# Patient Record
Sex: Male | Born: 1955 | Race: White | Hispanic: No | Marital: Married | Smoking: Former smoker
Health system: Southern US, Community
[De-identification: ages and names within clinical notes are randomized; demographics above are authoritative.]

## PROBLEM LIST (undated history)

## (undated) DIAGNOSIS — K429 Umbilical hernia without obstruction or gangrene: Secondary | ICD-10-CM

## (undated) DIAGNOSIS — J439 Emphysema, unspecified: Secondary | ICD-10-CM

## (undated) DIAGNOSIS — M1990 Unspecified osteoarthritis, unspecified site: Secondary | ICD-10-CM

## (undated) DIAGNOSIS — I2510 Atherosclerotic heart disease of native coronary artery without angina pectoris: Secondary | ICD-10-CM

## (undated) DIAGNOSIS — F1910 Other psychoactive substance abuse, uncomplicated: Secondary | ICD-10-CM

## (undated) DIAGNOSIS — I10 Essential (primary) hypertension: Secondary | ICD-10-CM

## (undated) DIAGNOSIS — E7800 Pure hypercholesterolemia, unspecified: Secondary | ICD-10-CM

## (undated) DIAGNOSIS — R0683 Snoring: Secondary | ICD-10-CM

## (undated) DIAGNOSIS — K439 Ventral hernia without obstruction or gangrene: Secondary | ICD-10-CM

## (undated) DIAGNOSIS — F419 Anxiety disorder, unspecified: Secondary | ICD-10-CM

## (undated) DIAGNOSIS — K219 Gastro-esophageal reflux disease without esophagitis: Secondary | ICD-10-CM

## (undated) DIAGNOSIS — G4730 Sleep apnea, unspecified: Secondary | ICD-10-CM

## (undated) DIAGNOSIS — K529 Noninfective gastroenteritis and colitis, unspecified: Secondary | ICD-10-CM

## (undated) DIAGNOSIS — I219 Acute myocardial infarction, unspecified: Secondary | ICD-10-CM

## (undated) DIAGNOSIS — T7840XA Allergy, unspecified, initial encounter: Secondary | ICD-10-CM

## (undated) HISTORY — DX: Gastro-esophageal reflux disease without esophagitis: K21.9

## (undated) HISTORY — DX: Essential (primary) hypertension: I10

## (undated) HISTORY — DX: Allergy, unspecified, initial encounter: T78.40XA

## (undated) HISTORY — DX: Atherosclerotic heart disease of native coronary artery without angina pectoris: I25.10

## (undated) HISTORY — PX: EXTERNAL EAR SURGERY: SHX627

## (undated) HISTORY — PX: COLONOSCOPY: SHX174

## (undated) HISTORY — PX: APPENDECTOMY: SHX54

## (undated) HISTORY — DX: Noninfective gastroenteritis and colitis, unspecified: K52.9

## (undated) HISTORY — PX: CARPAL TUNNEL RELEASE: SHX101

## (undated) HISTORY — DX: Anxiety disorder, unspecified: F41.9

## (undated) HISTORY — DX: Umbilical hernia without obstruction or gangrene: K42.9

## (undated) HISTORY — DX: Snoring: R06.83

## (undated) HISTORY — DX: Unspecified osteoarthritis, unspecified site: M19.90

## (undated) HISTORY — PX: OTHER SURGICAL HISTORY: SHX169

## (undated) HISTORY — PX: ELBOW SURGERY: SHX618

## (undated) HISTORY — PX: DG ARTHRO THUMB*R*: HXRAD209

## (undated) HISTORY — PX: NECK SURGERY: SHX720

## (undated) HISTORY — PX: UPPER GASTROINTESTINAL ENDOSCOPY: SHX188

## (undated) HISTORY — DX: Acute myocardial infarction, unspecified: I21.9

## (undated) HISTORY — DX: Ventral hernia without obstruction or gangrene: K43.9

## (undated) HISTORY — DX: Pure hypercholesterolemia, unspecified: E78.00

## (undated) HISTORY — DX: Other psychoactive substance abuse, uncomplicated: F19.10

## (undated) HISTORY — DX: Sleep apnea, unspecified: G47.30

---

## 1998-02-14 ENCOUNTER — Ambulatory Visit (HOSPITAL_BASED_OUTPATIENT_CLINIC_OR_DEPARTMENT_OTHER): Admission: RE | Admit: 1998-02-14 | Discharge: 1998-02-14 | Payer: Self-pay | Admitting: Orthopedic Surgery

## 1998-12-31 ENCOUNTER — Emergency Department (HOSPITAL_COMMUNITY): Admission: EM | Admit: 1998-12-31 | Discharge: 1998-12-31 | Payer: Self-pay | Admitting: *Deleted

## 2004-01-21 ENCOUNTER — Ambulatory Visit: Payer: Self-pay | Admitting: Internal Medicine

## 2004-02-08 ENCOUNTER — Ambulatory Visit: Payer: Self-pay | Admitting: Internal Medicine

## 2005-01-16 DIAGNOSIS — I2510 Atherosclerotic heart disease of native coronary artery without angina pectoris: Secondary | ICD-10-CM

## 2005-01-16 HISTORY — DX: Atherosclerotic heart disease of native coronary artery without angina pectoris: I25.10

## 2005-01-17 ENCOUNTER — Inpatient Hospital Stay (HOSPITAL_COMMUNITY): Admission: EM | Admit: 2005-01-17 | Discharge: 2005-01-21 | Payer: Self-pay | Admitting: Emergency Medicine

## 2005-02-19 ENCOUNTER — Encounter (HOSPITAL_COMMUNITY): Admission: RE | Admit: 2005-02-19 | Discharge: 2005-05-20 | Payer: Self-pay | Admitting: Cardiology

## 2006-06-11 ENCOUNTER — Ambulatory Visit: Payer: Self-pay | Admitting: Internal Medicine

## 2006-06-17 ENCOUNTER — Ambulatory Visit: Payer: Self-pay | Admitting: Internal Medicine

## 2006-07-15 ENCOUNTER — Ambulatory Visit: Payer: Self-pay | Admitting: Internal Medicine

## 2007-07-22 ENCOUNTER — Encounter: Payer: Self-pay | Admitting: Internal Medicine

## 2007-07-22 DIAGNOSIS — K253 Acute gastric ulcer without hemorrhage or perforation: Secondary | ICD-10-CM | POA: Insufficient documentation

## 2007-07-22 DIAGNOSIS — K2100 Gastro-esophageal reflux disease with esophagitis, without bleeding: Secondary | ICD-10-CM | POA: Insufficient documentation

## 2007-07-22 DIAGNOSIS — K263 Acute duodenal ulcer without hemorrhage or perforation: Secondary | ICD-10-CM | POA: Insufficient documentation

## 2007-07-22 DIAGNOSIS — K219 Gastro-esophageal reflux disease without esophagitis: Secondary | ICD-10-CM | POA: Insufficient documentation

## 2008-03-24 ENCOUNTER — Ambulatory Visit (HOSPITAL_BASED_OUTPATIENT_CLINIC_OR_DEPARTMENT_OTHER): Admission: RE | Admit: 2008-03-24 | Discharge: 2008-03-24 | Payer: Self-pay | Admitting: Otolaryngology

## 2008-04-01 ENCOUNTER — Ambulatory Visit: Payer: Self-pay | Admitting: Internal Medicine

## 2009-02-07 ENCOUNTER — Encounter (INDEPENDENT_AMBULATORY_CARE_PROVIDER_SITE_OTHER): Payer: Self-pay | Admitting: *Deleted

## 2009-03-04 ENCOUNTER — Encounter (INDEPENDENT_AMBULATORY_CARE_PROVIDER_SITE_OTHER): Payer: Self-pay | Admitting: *Deleted

## 2009-03-12 ENCOUNTER — Encounter (INDEPENDENT_AMBULATORY_CARE_PROVIDER_SITE_OTHER): Payer: Self-pay | Admitting: *Deleted

## 2009-03-13 ENCOUNTER — Ambulatory Visit: Payer: Self-pay | Admitting: Internal Medicine

## 2009-03-25 ENCOUNTER — Ambulatory Visit: Payer: Self-pay | Admitting: Internal Medicine

## 2010-03-18 NOTE — Miscellaneous (Signed)
Summary: [facility]  Clinical Lists Changes  Medications: Added new medication of MOVIPREP 100 GM  SOLR (PEG-KCL-NACL-NASULF-NA ASC-C) As per prep instructions. - Signed Rx of MOVIPREP 100 GM  SOLR (PEG-KCL-NACL-NASULF-NA ASC-C) As per prep instructions.;  #1 x 0;  Signed;  Entered by: Ezra Sites RN;  Authorized by: Hilarie Fredrickson MD;  Method used: Electronically to [facility]  [address] [phone]*, [address] [place], [place], [place]  [address], Ph: [phone] or [phone], Fax: [phone] Observations: Added new observation of NKA: T (03/13/2009 11:04)    Prescriptions: MOVIPREP 100 GM  SOLR (PEG-KCL-NACL-NASULF-NA ASC-C) As per prep instructions.  #1 x 0   Entered by:   Ezra Sites RN   Authorized by:   Hilarie Fredrickson MD   Signed by:   Ezra Sites RN on 03/13/2009   Method used:   Electronically to        [facility]  [address] [phone]* (retail)       [address]       [place], [place]  [address]       Ph: [phone] or [phone]       Fax: [phone]   RxID:   [id number]

## 2010-03-18 NOTE — Procedures (Signed)
Summary: Colonoscopy  Patient: [name] Note: All result statuses are Final unless otherwise noted.  Tests: (1) Colonoscopy (COL)   COL Colonoscopy           DONE     [facility]     [address]     [place], [place]  [address]           COLONOSCOPY PROCEDURE REPORT           PATIENT:  Jason, Munoz  MR#:  [id number]     BIRTHDATE:  16-Nov-1955, 53 yrs. old  GENDER:  male           ENDOSCOPIST:  Wilhemina Bonito. Eda Keys, MD     Referred by:  Surveillance Program Recall,           PROCEDURE DATE:  03/25/2009     PROCEDURE:  Surveillance Colonoscopy     ASA CLASS:  Class II     INDICATIONS:  history of hyperplastic polyps ;01-2004           MEDICATIONS:   Fentanyl 50 mcg IV, Versed 7 mg IV           DESCRIPTION OF PROCEDURE:   After the risks benefits and     alternatives of the procedure were thoroughly explained, informed     consent was obtained.  Digital rectal exam was performed and     revealed no abnormalities.   The LB CF-[id number] endoscope     was introduced through the anus and advanced to the cecum, which     was identified by both the appendix and ileocecal valve, without     limitations. TIME TO CECUM = 2:09 MIN. The quality of the prep was     excellent, using MoviPrep.  The instrument was then slowly     withdrawn (T=11:45 MIN) as the colon was fully examined.     <<PROCEDUREIMAGES>>           FINDINGS:  A normal appearing cecum, ileocecal valve, and     appendiceal orifice were identified. The ascending, hepatic     flexure, transverse, splenic flexure, descending, sigmoid colon,     and rectum appeared unremarkable.  No polyps or cancers were seen.     Retroflexed views in the rectum revealed no abnormalities.    The     scope was then withdrawn from the patient and the procedure     completed.           COMPLICATIONS:  None           ENDOSCOPIC IMPRESSION:     1) Normal colon     2) No polyps or cancers     RECOMMENDATIONS:     1)  Continue current colorectal screening recommendations for     "routine risk" patients with a repeat colonoscopy in 10 years.           ______________________________     Wilhemina Bonito. Eda Keys, MD           CC:  Geoffry Paradise, MD; The Patient           n.     eSIGNED:   Wilhemina Bonito. Eda Keys at 03/25/2009 12:08 PM           Nickolas Madrid, [id number]  Note: An exclamation mark (!) indicates a result that was not dispersed into the flowsheet. Document Creation Date: 03/25/2009 12:09 PM _______________________________________________________________________  (1) Order result status: Final Collection or  observation date-time: 03/25/2009 12:02 Requested date-time:  Receipt date-time:  Reported date-time:  Referring Physician:   Ordering Physician: Fransico Setters [phone]) Specimen Source:  Source: Launa Grill Order Number: [id number] Lab site:   Appended Document: Colonoscopy    Clinical Lists Changes  Observations: Added new observation of COLONNXTDUE: 03/2019 (03/25/2009 12:54)

## 2010-03-18 NOTE — Letter (Signed)
Summary: [facility] Instructions  [facility]  [address] [place], [place] [address]   Phone: [phone]  Fax: [phone]       Jason Munoz    [birth year]    MRN: [id number]        Procedure Day /Date:  ONGEXB 03/25/09     Arrival Time:  10:00am     Procedure Time:  11:00am     Location of Procedure:                    _X _  [facility] (4th Floor)                        PREPARATION FOR COLONOSCOPY WITH MOVIPREP   Starting 5 days prior to your procedure  Wednesday 02/02  do not eat nuts, seeds, popcorn, corn, beans, peas,  salads, or any raw vegetables.  Do not take any fiber supplements (e.g. Metamucil, Citrucel, and Benefiber).  THE DAY BEFORE YOUR PROCEDURE         DATE:  02/06   DAY:  Sunday  1.  Drink clear liquids the entire day-NO SOLID FOOD  2.  Do not drink anything colored red or purple.  Avoid juices with pulp.  No orange juice.  3.  Drink at least 64 oz. (8 glasses) of fluid/clear liquids during the day to prevent dehydration and help the prep work efficiently.  CLEAR LIQUIDS INCLUDE: Water Jello Ice Popsicles Tea (sugar ok, no milk/cream) Powdered fruit flavored drinks Coffee (sugar ok, no milk/cream) Gatorade Juice: apple, white grape, white cranberry  Lemonade Clear bullion, consomm, broth Carbonated beverages (any kind) Strained chicken noodle soup Hard Candy                             4.  In the morning, mix first dose of MoviPrep solution:    Empty 1 Pouch A and 1 Pouch B into the disposable container    Add lukewarm drinking water to the top line of the container. Mix to dissolve    Refrigerate (mixed solution should be used within 24 hrs)  5.  Begin drinking the prep at 5:00 p.m. The MoviPrep container is divided by 4 marks.   Every 15 minutes drink the solution down to the next mark (approximately 8 oz) until the full liter is complete.   6.  Follow completed prep with 16 oz of clear liquid of your  choice (Nothing red or purple).  Continue to drink clear liquids until bedtime.  7.  Before going to bed, mix second dose of MoviPrep solution:    Empty 1 Pouch A and 1 Pouch B into the disposable container    Add lukewarm drinking water to the top line of the container. Mix to dissolve    Refrigerate  THE DAY OF YOUR PROCEDURE      DATE:  03/25/09  DAY:  Monday  Beginning at  6:00 a.m. (5 hours before procedure):         1. Every 15 minutes, drink the solution down to the next mark (approx 8 oz) until the full liter is complete.  2. Follow completed prep with 16 oz. of clear liquid of your choice.    3. You may drink clear liquids until  9:00am  (2 HOURS BEFORE PROCEDURE).   MEDICATION INSTRUCTIONS  Unless otherwise instructed, you should take regular prescription medications with a small  sip of water   as early as possible the morning of your procedure.           OTHER INSTRUCTIONS  You will need a responsible adult at least 53 years of age to accompany you and drive you home.   This person must remain in the waiting room during your procedure.  Wear loose fitting clothing that is easily removed.  Leave jewelry and other valuables at home.  However, you may wish to bring a book to read or  an iPod/MP3 player to listen to music as you wait for your procedure to start.  Remove all body piercing jewelry and leave at home.  Total time from sign-in until discharge is approximately 2-3 hours.  You should go home directly after your procedure and rest.  You can resume normal activities the  day after your procedure.  The day of your procedure you should not:   Drive   Make legal decisions   Operate machinery   Drink alcohol   Return to work  You will receive specific instructions about eating, activities and medications before you leave.    The above instructions have been reviewed and explained to me by   Ezra Sites RN  March 13, 2009 11:28  AM     I fully understand and can verbalize these instructions _____________________________ Date _________

## 2010-03-18 NOTE — Letter (Signed)
Summary: Colonoscopy Letter  [facility]  [address] [place], [place] [address]   Phone: [phone]  Fax: [phone]      February 07, 2009 MRN: [id number]   Jason Munoz [address] [place], [place]  [address]   Dear [name],   According to your medical record, it is time for you to schedule a Colonoscopy. The American Cancer Society recommends this procedure as a method to detect early colon cancer. Patients with a family history of colon cancer, or a personal history of colon polyps or inflammatory bowel disease are at increased risk.  This letter has beeen generated based on the recommendations made at the time of your procedure. If you feel that in your particular situation this may no longer apply, please contact our office.  Please call our office at [phone] to schedule this appointment or to update your records at your earliest convenience.  Thank you for cooperating with [place] to provide you with the very best care possible.   Sincerely,  Wilhemina Bonito. Marina Goodell, M.D.  [facility] Gastroenterology Division [phone]

## 2010-03-18 NOTE — Procedures (Signed)
Summary: EGD   EGD  Procedure date:  06/17/2006  Findings:      Location: [facility]    Patient Name: Jason Munoz, Jason Munoz. MRN:  Procedure Procedures: Panendoscopy (EGD) CPT: 43235.    with biopsy(s)/brushing(s). CPT: D1846139.  Personnel: Endoscopist: Wilhemina Bonito. Marina Goodell, MD.  Exam Location: Exam performed in Outpatient Clinic. Outpatient  Patient Consent: Procedure, Alternatives, Risks and Benefits discussed, consent obtained, from patient. Consent was obtained by the RN.  Indications Symptoms: Dysphagia. Abdominal pain, location: epigastric. Reflux symptoms  History  Current Medications: Patient is not currently taking Coumadin.  Pre-Exam Physical: Performed Jun 17, 2006  Cardio-pulmonary exam, HEENT exam, Abdominal exam, Mental status exam WNL.  Comments: Pt. history reviewed/updated, physical exam performed prior to initiation of sedation?YES Exam Exam Info: Maximum depth of insertion Duodenum, intended Duodenum. Patient position: on left side. Vocal cords visualized. Gastric retroflexion performed. Images taken. ASA Classification: III. Tolerance: excellent.  Sedation Meds: Patient assessed and found to be appropriate for moderate (conscious) sedation. Fentanyl 50 mcg. given IV. Versed 5 mg. given IV. Cetacaine Spray 2 sprays given aerosolized.  Monitoring: BP and pulse monitoring done. Oximetry used. Supplemental O2 given  Findings ESOPHAGEAL INFLAMMATION: as a result of reflux. Severity is mild, erythema only.  Edema present. ICD9: Esophagitis, Reflux: 530.11. Comments: NO BARRETT'S OR OBVIOUS STRICTURE.  HIATAL HERNIA: ICD9: GERD: 530.81. ULCER: in Antrum Maximum size: 4 mm. Not bleeding, clear ulcer base. RUT done, results pending.  ICD9: Ulcer, Gastric, Acute without Hemorrhage: 531.30.  ULCER: in Duodenal Bulb Maximum size: 8 mm. Not bleeding, clear ulcer base. done, results pending.  ICD9: Ulcer, Duodenal, Acute without Hemorrhage: 532.30.  Comment: Friable.   Assessment  Diagnoses: 530.11: Esophagitis, Reflux.  530.81: GERD.  531.30: Ulcer, Gastric, Acute without Hemorrhage.  532.30: Ulcer, Duodenal, Acute without Hemorrhage.   Events  Unplanned Intervention: No unplanned interventions were required.  Unplanned Events: There were no complications. Plans Medication(s): PPI: Esomeprazole/Nexium 40 mg BID, starting Jun 17, 2006   Comments: 1.daily baby asprin is OK 2.NO ADVIL OR SIMILAR PRODUCTS. USE TYLENOL FOR ACHES AND PAINS Disposition: After procedure patient sent to recovery. After recovery patient sent home.  Scheduling: Office Visit, to [employer]. Marina Goodell, MD, IN 4 WEEKS   Comments: CANCELL ABDOMINAL ULTRASOUND PLANNED FOR TOMORROW  This report was created from the original endoscopy report, which was reviewed and signed by the above listed endoscopist.

## 2010-03-18 NOTE — Letter (Signed)
Summary: Previsit letter  [facility]  [address] [place], [place] [address]   Phone: [phone]  Fax: [phone]       03/04/2009 MRN: [id number]  Jason Munoz [address] [place], [place]  [address]  Dear Jason Munoz,  Welcome to the Gastroenterology Division at [facility].    You are scheduled to see a nurse for your pre-procedure visit on 03-13-09 at 11:00a.m. on the 3rd floor at [facility], [address]  We ask that you try to arrive at our office 15 minutes prior to your appointment time to allow for check-in.  Your nurse visit will consist of discussing your medical and surgical history, your immediate family medical history, and your medications.    Please bring a complete list of all your medications or, if you prefer, bring the medication bottles and we will list them.  We will need to be aware of both prescribed and over the counter drugs.  We will need to know exact dosage information as well.  If you are on blood thinners (Coumadin, Plavix, Aggrenox, Ticlid, etc.) please call our office today/prior to your appointment, as we need to consult with your physician about holding your medication.   Please be prepared to read and sign documents such as consent forms, a financial agreement, and acknowledgement forms.  If necessary, and with your consent, a friend or relative is welcome to sit-in on the nurse visit with you.  Please bring your insurance card so that we may make a copy of it.  If your insurance requires a referral to see a specialist, please bring your referral form from your primary care physician.  No co-pay is required for this nurse visit.     If you cannot keep your appointment, please call [phone] to cancel or reschedule prior to your appointment date.  This allows [place] the opportunity to schedule an appointment for another patient in need of care.    Thank you for choosing [facility] for your medical  needs.  We appreciate the opportunity to care for you.  Please visit [place] at our website  to learn more about our practice.                     Sincerely.                                                                                                                   The Gastroenterology Division

## 2010-05-20 ENCOUNTER — Encounter: Payer: Self-pay | Admitting: Cardiology

## 2010-05-20 ENCOUNTER — Ambulatory Visit (INDEPENDENT_AMBULATORY_CARE_PROVIDER_SITE_OTHER): Payer: 59 | Admitting: Cardiology

## 2010-05-20 VITALS — BP 120/80 | HR 53 | Ht 67.0 in | Wt 189.0 lb

## 2010-05-20 DIAGNOSIS — I2510 Atherosclerotic heart disease of native coronary artery without angina pectoris: Secondary | ICD-10-CM | POA: Insufficient documentation

## 2010-05-20 DIAGNOSIS — E7800 Pure hypercholesterolemia, unspecified: Secondary | ICD-10-CM

## 2010-05-20 MED ORDER — NITROGLYCERIN 0.4 MG SL SUBL: 0.4000 mg | SUBLINGUAL_TABLET | SUBLINGUAL | Status: DC | PRN

## 2010-05-20 MED ORDER — ATORVASTATIN CALCIUM 20 MG PO TABS: 20.0000 mg | ORAL_TABLET | Freq: Every day | ORAL | Status: DC

## 2010-05-20 NOTE — Assessment & Plan Note (Signed)
No recurrent angina. We will schedule him for a stress echo to followup on his coronary disease. I have encouraged him to increase his aerobic activity and to lose weight.

## 2010-05-20 NOTE — Assessment & Plan Note (Signed)
Goal LDL is 70. We will increase his Lipitor to 20 mg daily. I've recommended he have his lab work repeated by Dr. Jacky Kindle in 3 months. He should also have fasting glucose recheck and CBC.

## 2010-05-20 NOTE — Patient Instructions (Addendum)
We will schedule you for a stress Echo.  We will increase your Lipitor to 20 mg daily. Target LDL cholesterol is 70.  You should have repeat lab work with Dr. Jacky Kindle in 2 months including fasting glucose, lipids, and blood counts.   We will follow up for an office visit in 1 year.

## 2010-05-20 NOTE — Progress Notes (Signed)
HPI Jason Munoz is seen today for followup of coronary artery disease. He was last seen in 2007. He underwent stenting of the proximal LAD in December of 2006 using a 3.5 x 15 mm vision bare metal stent. He has done very well since then. His last stress test in December 2007 was normal. He denies any recurrent chest pain, shortness of breath, or palpitations. He claims that he has been so busy at work that he is been unable to exercise. He has had no other significant changes in his medical history. No Known Allergies  Current Outpatient Prescriptions on File Prior to Visit  Medication Sig Dispense Refill  . aspirin 81 MG tablet Take 81 mg by mouth daily.        Marland Kitchen esomeprazole (NEXIUM) 40 MG packet Take 40 mg by mouth daily before breakfast.  30 each  12  . metoprolol succinate (TOPROL-XL) 25 MG 24 hr tablet Take 25 mg by mouth daily.          Past Medical History  Diagnosis Date  . Coronary artery disease 12/06    stent LAD   . Hypercholesteremia   . Colitis     Past Surgical History  Procedure Date  . Appendectomy   . Bilateral ankle fractures   . External ear surgery     History reviewed. No pertinent family history.  History   Social History  . Marital Status: Married    Spouse Name: N/A    Number of Children: 3  . Years of Education: N/A   Occupational History  .  [employer]   Social History Main Topics  . Smoking status: Former Smoker    Quit date: 05/19/1996  . Smokeless tobacco: Not on file  . Alcohol Use: No  . Drug Use: No  . Sexually Active: Not on file   Other Topics Concern  . Not on file   Social History Narrative  . No narrative on file    ROS The patient denies any heat or cold intolerance.  No weight gain or weight loss.  The patient denies headaches or blurry vision.  There is no cough or sputum production.  The patient denies dizziness.  There is no hematuria or hematochezia.  The patient denies any muscle aches or arthritis.  The patient  denies any rash.  The patient denies frequent falling or instability.  There is no history of depression or anxiety.  All other systems were reviewed and are negative.  PHYSICAL EXAM BP 120/80  Pulse 53  Ht 5\' 7"  (1.702 m)  Wt 189 lb (85.73 kg)  BMI 29.60 kg/m2 The patient is alert and oriented x 3.  The mood and affect are normal.  The skin is warm and dry.  Color is normal.  The HEENT exam reveals that the sclera are nonicteric.  The mucous membranes are moist.  The carotids are 2+ without bruits.  There is no thyromegaly.  There is no JVD.  The lungs are clear.  The chest wall is non tender.  The heart exam reveals a regular rate with a normal S1 and S2.  There are no murmurs, gallops, or rubs.  The PMI is not displaced.   Abdominal exam reveals good bowel sounds.  There is no guarding or rebound.  There is no hepatosplenomegaly or tenderness.  There are no masses.  Exam of the legs reveal no clubbing, cyanosis, or edema.  The legs are without rashes.  The distal pulses are intact.  Cranial nerves  II - XII are intact.  Motor and sensory functions are intact.  The gait is normal.  Laboratory data: ECG demonstrates normal sinus rhythm with a rate of 53 beats per minute. It is a normal ECG. Blood work from September of 2011 showed a glucose of 136. All his other chemistries were normal. White count was elevated at 15,100, hemoglobin was 17.3. Total cholesterol 163, triglycerides 89, HDL 48, LDL 97. Urinalysis was normal. ASSESSMENT AND PLAN

## 2010-05-28 ENCOUNTER — Ambulatory Visit (HOSPITAL_COMMUNITY): Payer: 59 | Attending: Cardiology | Admitting: Radiology

## 2010-05-28 DIAGNOSIS — I2510 Atherosclerotic heart disease of native coronary artery without angina pectoris: Secondary | ICD-10-CM | POA: Insufficient documentation

## 2010-06-05 ENCOUNTER — Other Ambulatory Visit (HOSPITAL_COMMUNITY): Payer: 59 | Admitting: Radiology

## 2010-07-01 NOTE — Assessment & Plan Note (Signed)
[facility]                         GASTROENTEROLOGY OFFICE NOTE   [place], [place]                      MRN:          [id number]  DATE:07/15/2006                            DOB:          1955/12/10    HISTORY:  Jason Munoz presents today for followup.  He is a 55 year old  gentleman who was evaluated on June 11, 2006, for epigastric pain.  See  that dictation for details.  On Jun 17, 2006, he underwent upper  endoscopy.  He was found to have reflux esophagitis, a sliding hiatal  hernia, as well as non-bleeding ulcers in the gastric antrum and  duodenal bulb.  Testing for H. pylori was negative.  His risk factor for  ulcer disease was felt to be chronic Advil use as well as daily aspirin  use.  He was placed on a b.i.d. proton pump inhibitor and asked to avoid  nonsteroidal anti-inflammatory drugs.  We did recommend that he stay on  his baby aspirin daily for cardioprotective purposes, given his prior  cardiac history.  Previously ordered abdominal ultrasound was cancelled  in lieu of these findings.  He presents today for followup.  Since his  endoscopy, the patient reports doing well.  He is completely  asymptomatic and pleased.  We reviewed his endoscopy report and the  implications of the findings.   CURRENT MEDICATIONS:  1. Nexium 40 mg b.i.d.  2. Lipitor.  3. Toprol.  4. Multivitamin.  5. Aspirin 81 mg.   PHYSICAL EXAMINATION:  GENERAL:  A well-appearing male in no acute  distress.  VITAL SIGNS:  Blood pressure is 118/82, heart rate 74, weight is 186.4  pounds.  ABDOMEN:  Soft without tenderness, mass, or hernia.   IMPRESSION:  1. 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  2. Gastroesophageal reflux disease with endoscopic evidence of      esophagitis.  3. Coronary artery disease with prior coronary artery stent placement.   RECOMMENDATIONS:  1. Change Nexium to 40 mg p.o. daily indefinitely.  A daily proton      pump inhibitor will not only address his reflux disease but also      provide gastrointestinal mucosal protection since he requires co-      administration of aspirin for his heart disease.  Otherwise, he      should continue      to avoid unnecessary nonsteroidal anti-inflammatory drugs.  2. He will return to the care of Dr. Jacky Kindle.     Wilhemina Bonito. Marina Goodell, MD  Electronically Signed    JNP/MedQ  DD: 07/15/2006  DT: 07/15/2006  Job #: [id number]   cc:   Geoffry Paradise, M.D.  Peter M. [place], M.D.

## 2010-07-04 NOTE — H&P (Signed)
Jason Munoz, Jason Munoz NO.:  [id number]   MEDICAL RECORD NO.:  [id number]          PATIENT TYPE:  EMS   LOCATION:  MAJO                         FACILITY:  [facility]   PHYSICIAN:  Peter M. Swaziland, M.D.  DATE OF BIRTH:  02-03-1956   DATE OF ADMISSION:  01/17/2005  DATE OF DISCHARGE:                                HISTORY & PHYSICAL   HISTORY OF PRESENT ILLNESS:  Jason Munoz is a 55 year old white male who  presented to the emergency room today for evaluation of chest pain.  He  states that one week ago he was mowing his grass when he developed a dull  deep substernal chest pain associated with shortness of breath.  He stated  he just pushed through it and it did resolve when he stopped to rest.  A  couple of other times this week, he has had similar symptoms with walking.  Yesterday he was walking back to his car from the mall and developed more  significant chest pain and shortness of breath.  He states it was bad enough  that he had to stop.  He subsequently had to sit in his car for a few  minutes to allow the pain to subside.  This morning, he again described some  chest discomfort and shortness of breath and felt very uneasy and presented  to the emergency room for evaluation.  He is currently pain free.  With  these episodes, he states he did feel a little bit clammy.  He has also  noticed a decreased level of energy this week.   PAST MEDICAL HISTORY:  1.  Previous appendectomy.  2.  Bilateral ankle fractures.  3.  Previous ear surgery.  4.  History of hypercholesterolemia.  5.  History of colitis.   He is on no medications except aspirin daily.   He has no known allergies.   SOCIAL HISTORY:  He is married.  He has three sons.  He works for [employer].  He smokes cigars but not cigarettes.  He denies alcohol use.   FAMILY HISTORY:  He has three maternal uncles who have had coronary disease  and bypass surgery at a premature age.  His sister is okay.  His  father is  okay.  His mother has hypercholesterolemia.   REVIEW OF SYSTEMS:  Otherwise negative.   PHYSICAL EXAMINATION:  GENERAL:  The patient is a middle-aged male in no  distress.  VITAL SIGNS:  Blood pressure is 122/79, pulse 59 and regular, respirations  are normal.  He is afebrile.  Sats are 99% on room air.  HEENT:  Normocephalic, atraumatic.  Pupils equal, round, reactive to light  and accommodation.  Extraocular movements are full.  Oropharynx is clear.  NECK:  Supple without JVD, adenopathy, thyromegaly, or bruits.  LUNGS:  Clear.  CARDIAC:  Reveals a regular rate and rhythm without murmur, rub, or gallop,  or click.  There is no chest wall tenderness to palpation.  ABDOMEN:  Soft and nontender without masses or bruits.  EXTREMITIES:  Without edema.  Pulses are 2+ and symmetric.  He has no  cyanosis.  NEUROLOGIC:  Intact.   LABORATORY DATA:  Hemoglobin is 15.3.  Sodium 141, potassium 3.9, chloride  108, CO2 29, BUN 14, creatinine 1.2, glucose 65.  Point-of-care cardiac  enzymes are negative x2.  ECG is normal.   IMPRESSION:  1.  Unstable angina pectoris with typical cardiac symptoms, new onset.  2.  Hypercholesterolemia.   PLAN:  1.  Admit to telemetry.  2.  Rule out myocardial infarction.  3.  We will obtain serial cardiac enzymes.  4.  We will treat with aspirin and IV heparin and topical nitrates.  5.  Would hold beta-blocker at this point due to his resting bradycardia.  6.  We will obtain lipid panel and A1c.  7.  Anticipate cardiac catheterization on Monday.           ______________________________  Peter M. Swaziland, M.D.     PMJ/MEDQ  D:  01/17/2005  T:  01/17/2005  Job:  [id number]   cc:   Geoffry Paradise, M.D.  Fax: [phone]

## 2010-07-04 NOTE — Assessment & Plan Note (Signed)
[name]                         GASTROENTEROLOGY OFFICE NOTE   [name], [name]                      MRN:          [id number]  DATE:06/11/2006                            DOB:          [birth year]    REASON FOR CONSULTATION:  Epigastric pain.   HISTORY:  This is a pleasant 55 year old white male with a history of  hyperlipidemia and coronary artery disease for which he has undergone  prior stenting of the proximal left anterior descending coronary artery  in December of 2006.  He presents himself now for evaluation of  epigastric pain of one month's duration.  He was seen in this office in  October of 2005 for abdominal pain and rectal bleeding.  Complete  colonoscopy, including intubation of the terminal ileum was normal,  except for a dominative hyperplastic colon polyp which was removed.  He  has not been seen since that exam.  Patient reports a one-month history  of focal non-radiating epigastric pain.  He describes the pain as 5 or  6/10.  It can awake him at night.  There has been no associated nausea,  vomiting, fevers, melena or change in bowel habits.  He was evaluated at  Urgent Care and was placed on Prevacid which he states helped.  Off the  medications, symptoms seemed to return.  He is now using his father's  Nexium.  The patient has rare problems with indigestion and heartburn.  He does have intermittent solid food dysphagia.  He uses an aspirin  daily and Advil on a p.r.n. basis.  Patient feels that his symptoms may  be improved with meals.  Certainly seems to be improved with medication.  No obvious exacerbating factors.   PAST MEDICAL HISTORY:  As above.   SURGICAL HISTORY:  1. Appendectomy.  2. Ankle surgery.   ALLERGIES:  NO KNOWN DRUG ALLERGIES.   CURRENT MEDICATIONS:  Nexium 40 mg daily, Lipitor unspecified dose,  Toprol unspecified dose daily, aspirin one daily.   FAMILY HISTORY:  Negative for gastrointestinal  malignancy.   SOCIAL HISTORY:  Patient is married with three sons.  He works for  [employer], does not smoke or use alcohol, though has  used smokeless tobacco.   PHYSICAL EXAMINATION:  GENERAL:  Well-appearing male, no acute distress.  He is alert and oriented.  VITAL SIGNS:  Blood pressure was not recorded.  Heart rate is 68 and  regular.  His weight is 190.2 pounds.  HEENT:  Sclerae anicteric, conjunctivae pink, oral mucosa intact.  No  adenopathy.  LUNGS:  Clear.  HEART:  Regular.  ABDOMEN:  Soft without tenderness masses or hernia.  Good bowel sounds  heard.   IMPRESSION:  This is a 55 year old with coronary artery disease, who  presents with intermittent focal epigastric pain.  This is on the  background of gastroesophageal reflux disease and intermittent  dysphagia.  Rule out pain secondary to reflux or esophagitis.  Rule out  ulcer disease.  Rule out gallbladder disease.   RECOMMENDATIONS:  1. Abdominal ultrasound to rule out gallstones.  2. Upper endoscopy with possible esophageal dilation for  dysphagia.      [name] of the procedure, as well as the risks, benefits and      alternatives have been reviewed.  He understood and agreed to      proceed.  3. Daily proton pump inhibitor therapy.  Samples of Nexium, as well as      prescription with multiple refills has been provided.  4. Ongoing general medical care with Dr. Jacky Kindle.     Wilhemina Bonito. Marina Goodell, MD  Electronically Signed    JNP/MedQ  DD: 06/11/2006  DT: 06/11/2006  Job #: [id number]   cc:   Geoffry Paradise, M.D.  Peter M. Swaziland, M.D.

## 2010-07-04 NOTE — Discharge Summary (Signed)
NAMEBOE, DEANS NO.:  [id number]   MEDICAL RECORD NO.:  [id number]          PATIENT TYPE:  INP   LOCATION:  6529                         FACILITY:  [facility]   PHYSICIAN:  Peter M. Swaziland, M.D.  DATE OF BIRTH:  1955/10/14   DATE OF ADMISSION:  01/17/2005  DATE OF DISCHARGE:  01/21/2005                                 DISCHARGE SUMMARY   HISTORY OF PRESENT ILLNESS:  Jason Munoz is a 55 year old white male who  presented with a one-week history of typical exertional chest pain. This was  described as a deep substernal chest pain associated with shortness of  breath. The symptoms had progressed throughout the week and had developed at  rest on the morning of admission. He was admitted for further evaluation of  unstable angina. He does have a history of hypercholesterolemia.  He also  dips tobacco. For details of his past medical history, social history,  family history, physical exam please see admission history physical.   LABORATORY EVALUATION:  Resting ECG was normal.   Chest x-ray showed no active disease. There was some right upper lobe  scarring.   His pH was 7.36, pCO2 of 51, bicarb 29.  White count 7900, hemoglobin 14.7,  hematocrit 42.7, platelets 211,000.  Coags were normal.  Sodium 141,  potassium 3.9, chloride 106, glucose was 65, BUN 14, creatinine 1.2, other  chemistries were normal. Liver function studies were normal.  Magnesium is  2.4.  A1c was 5.1%.  Serial cardiac enzymes including CPK MB and troponin  were negative x4.  Lipid panel showed total cholesterol of 184,  triglycerides 130, HDL 41, LDL 117.   HOSPITAL COURSE:  The patient was admitted to telemetry monitoring. He was  placed on IV heparin.  He was given aspirin. His initial presentation he was  bradycardic so beta-blockers were not given. He was treated with topical  nitrates. He had no recurrent chest pain. He was started on Statin drug. He  was counseled on stopping use of  tobacco products. On January 20, 2005, the  patient underwent cardiac catheterization. This demonstrated single-vessel  obstructive coronary disease with a 90% proximal LAD stenosis. Left  ventricular function was normal with ejection fraction of 60%. The patient  underwent stenting of the proximal LAD using a 3.5 x 15 mm Vision stent.  This was successfully deployed with excellent angiographic result. The  patient did have some mild chest discomfort following the procedure. That  evening at approximately 5:30 p.m. he developed severe substernal chest pain  radiating to his arms associated with some shortness of breath. ECG showed  flipped T-waves in V1-V3 without acute ST-segment changes. The patient's  pain was relieved in approximately 10 minutes with IV morphine and IV  nitroglycerin. Subsequent cardiac enzymes were elevated with CK going from  65 with 1.6 MB to 248 with 28.8 MB and then to 211 with 25.4 MB, troponin  increased from 0.07-2.77 and then declined 2.43.  Follow-up ECG the next  morning was normal. The patient had no further chest pain during his  hospital stay. In reviewing his cath  films, it was felt that the most likely  etiology of his chest pain postprocedure was compromise of a small diagonal  branch which arose just distal to the stented segment. This branch was not  large enough for intervention and may have just represented acute spasm. The  patient was observed in the hospital an additional day because of the  increased cardiac enzymes but was stable throughout this time without  recurrent symptoms and it was felt that he was stable for discharge.   DISCHARGE DIAGNOSES:  1.  Unstable angina.  2.  Single vessel obstructive coronary disease status post stenting of the      proximal left anterior descending.  3.  Hypercholesterolemia.  4.  Tobacco use with oral tobacco.   DISCHARGE MEDICATIONS:  1.  Aspirin 325 mg per day.  2.  Plavix 75 mg per day.  3.   Lipitor 10 mg per day.  4.  Toprol XL 25 mg per day.  5.  Nitroglycerin sublingual p.r.n.   The patient is to avoid lifting for one week. He may return to work on  Monday January 26, 2005. Will instruct a low-fat diet and avoidance of  tobacco.  He will follow up Dr. Swaziland in two weeks.   DISCHARGE STATUS:  Improved.           ______________________________  Peter M. Swaziland, M.D.     PMJ/MEDQ  D:  01/21/2005  T:  01/21/2005  Job:  [id number]   cc:   Jason Munoz, M.D.  Fax: [phone]

## 2010-07-04 NOTE — Cardiovascular Report (Signed)
Jason Munoz, Jason Munoz NO.:  [id number]   MEDICAL RECORD NO.:  [id number]          PATIENT TYPE:  INP   LOCATION:  6529                         FACILITY:  [facility]   PHYSICIAN:  Peter M. Swaziland, M.D.  DATE OF BIRTH:  1955-05-28   DATE OF PROCEDURE:  01/21/2005  DATE OF DISCHARGE:  01/21/2005                              CARDIAC CATHETERIZATION   INDICATIONS FOR PROCEDURE:  This 55 year old white male presents with  unstable angina typical with exertional symptoms over the past week.  He has  a history of hypercholesterolemia.   PROCEDURES:  Left heart catheterization, coronary and left ventricular  angiography and stenting of the proximal left anterior descending.   CARDIOLOGIST:  Peter M. Swaziland, M.D.   ACCESS:  Via the right femoral artery using standard Seldinger technique.   EQUIPMENT USED:  6-French 4 cm right and left Judkins catheter, 6-French  pigtail catheter, 6-French left 3.5 Voda guide, a 0.14 high-torque floppy  wire, a 3.0 x 12 mm Maverick balloon and a 3.5 x 15 mm Vision stent.   CONTRAST:  185 cc of Omnipaque.   MEDICATIONS:  Versed total 2 mg IV, fentanyl 25 mcg IV, Integrilin double  bolus at 180 mcg/kg followed by continuous infusion of 2 mcg/kg per minute,  heparin 5900 units IV.   HEMODYNAMIC DATA:  Aortic pressure is 114/78 with a mean of 95 mmHg.  Left  ventricle pressure was 170 with EDP of 15 mmHg.   ANGIOGRAPHIC DATA:  The left coronary artery arises and distributes  normally.  There is mild irregularity at the ostium of the left main  coronary less than 20%.   The left anterior descending artery is a very large branch which extends  around the apex. There is severe high-grade stenosis in the proximal vessel  up to 90-95%. The diagonal branch is relatively small and without  significant disease.   The left circumflex coronary gives rise to a single large marginal vessel  and then terminates in the AV groove.  The marginal vessel  has somewhat  diffuse plaque up to 30%.   The right coronary arises and distributes normally.  It is a dominant  vessel.  It has scattered plaque throughout the proximal mid vessel up to  30%.   Left ventricular angiography was performed in the RAO view.  This  demonstrates normal left ventricular size and contractility with normal  systolic function.  Ejection fraction is estimated 60%.  There is no mitral  regurgitation or prolapse.   We proceeded at this point with intervention of the proximal LAD stenosis.  We initially used a JL-4 guide and a 0.014 high-torque floppy wire. We were  able to cross the lesion and perform initial balloon inflation using a 3.0 x  12 mm Maverick balloon dilating up to 6 and then 10 atmospheres.  Unfortunately the JL-4 guide gave poor support and backed out into the  aorta.  We were unable to reseat it in the ostium of the left main and the  wire was drawn across the lesion.  We then switched to the left Voda 3.5  mm  guide, which gave [place] excellent support.  We were able to recross the lesion  without any difficulty.  We then stented the lesion in the proximal LAD  using a 3.5 x 15 mm Multilink Vision stent. This was deployed at 9  atmospheres and then post dilated to 14 atmospheres. This yielded excellent  angiographic result with 0% residual stenosis, and there was TIMI grade 3  flow throughout the vessels and the diagonals as well.  The patient did have  slight residual chest pain even after with stenting.  The patient was  maintained on IV Integrilin.  He was  given 600 mg of Plavix p.o. and  continued on aspirin.   FINAL INTERPRETATION:  1.  Single-vessel obstructive atherosclerotic coronary disease.  2.  Normal left ventricular function.  3.  Successful intracoronary stenting of the proximal left anterior      descending.           ______________________________  Peter M. Swaziland, M.D.     PMJ/MEDQ  D:  01/21/2005  T:  01/21/2005  Job:   [id number]   cc:   Geoffry Paradise, M.D.  Fax: [phone]

## 2010-07-04 NOTE — Procedures (Signed)
[name], Jason Munoz               ACCOUNT NO.:  [id number]   MEDICAL RECORD NO.:  [id number]          PATIENT TYPE:  OUT   LOCATION:  SLEEP CENTER                 FACILITY[facility]  [facility]   PHYSICIAN:  Clinton D. Maple Hudson, MD, FCCP, FACPDATE OF BIRTH:  28-Jan-1956   DATE OF STUDY:                            NOCTURNAL POLYSOMNOGRAM   REFERRING PHYSICIAN:  Antony Contras, MD   INDICATION FOR STUDY:  Hypersomnia with sleep apnea.  The patient is  status post surgery for deviated septum.   EPWORTH SLEEPINESS SCORE:  Epworth sleepiness score 5/24.  BMI 28.2.  Weight 180 pounds.  Height 67 inches.  Neck 16 inches.   MEDICATIONS:  Home medications are charted and reviewed.   SLEEP ARCHITECTURE:  Total sleep time, 310 minutes with sleep efficiency  81.7%.  Stage I was 8.9%.  Stage II was 74.9%.  Stage III absent.  REM  16.3% of total sleep time.  Sleep latency was 3.5 minutes.  REM latency  73.5 minutes.  Wake after sleep onset, 66 minutes.  Arousal index 23.6.  No bedtime medication was taken.   RESPIRATORY DATA:  Apnea-hypopnea index (AHI) 7 per hour.  A total of 36  events was scored including 10 obstructive apneas, 1 central apnea, and  25 hypopneas.  Events were positional, also associated with supine sleep  position.  REM HPI 38 per hour.  This is a diagnostic NPSG study and  CPAP was not applied.   OXYGEN DATA:  Moderately loud snoring with oxygen desaturation to a  nadir of 85%.  Mean oxygen saturation through the study was 94.9% on  room air.  A total of 0.2 minutes was recorded with a saturation less  than 88%, insignificant.   CARDIAC DATA:  Normal sinus rhythm.   MOVEMENT/PARASOMNIA:  No significant movement disturbance.  Bathroom x1.   IMPRESSION/RECOMMENDATIONS:  1. Sleep architecture was unremarkable for sleep center environment.      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  2. Minimal obstructive sleep apnea/hypopnea  syndrome, AHI 7 per hour      with positional events noted only while sleeping supine.  3. Moderately loud snoring with oxygen desaturation to a nadir of 85%      and mean oxygen saturation through the study was normal at 94.9% on      room air.      Clinton D. Maple Hudson, MD, [facility], FACP  Diplomate, Biomedical engineer of Sleep Medicine  Electronically Signed     CDY/MEDQ  D:  03/31/2008 13:50:45  T:  03/31/2008 20:50:59  Job:  [id number]

## 2011-07-02 ENCOUNTER — Other Ambulatory Visit: Payer: Self-pay | Admitting: Cardiology

## 2011-07-02 DIAGNOSIS — I2510 Atherosclerotic heart disease of native coronary artery without angina pectoris: Secondary | ICD-10-CM

## 2011-07-02 MED ORDER — ATORVASTATIN CALCIUM 20 MG PO TABS: 20.0000 mg | ORAL_TABLET | Freq: Every day | ORAL | Status: DC

## 2011-11-24 ENCOUNTER — Encounter: Payer: Self-pay | Admitting: Cardiology

## 2011-12-11 ENCOUNTER — Other Ambulatory Visit: Payer: Self-pay

## 2011-12-11 DIAGNOSIS — I2510 Atherosclerotic heart disease of native coronary artery without angina pectoris: Secondary | ICD-10-CM

## 2011-12-11 MED ORDER — NITROGLYCERIN 0.4 MG SL SUBL: 0.4000 mg | SUBLINGUAL_TABLET | SUBLINGUAL | Status: DC | PRN

## 2012-06-08 ENCOUNTER — Other Ambulatory Visit: Payer: Self-pay | Admitting: Cardiology

## 2012-07-04 ENCOUNTER — Other Ambulatory Visit: Payer: Self-pay | Admitting: Cardiology

## 2012-10-04 ENCOUNTER — Ambulatory Visit (INDEPENDENT_AMBULATORY_CARE_PROVIDER_SITE_OTHER): Payer: 59 | Admitting: Nurse Practitioner

## 2012-10-04 ENCOUNTER — Encounter: Payer: Self-pay | Admitting: Nurse Practitioner

## 2012-10-04 VITALS — BP 170/100 | HR 84 | Ht 67.0 in | Wt 178.8 lb

## 2012-10-04 DIAGNOSIS — I10 Essential (primary) hypertension: Secondary | ICD-10-CM

## 2012-10-04 DIAGNOSIS — I259 Chronic ischemic heart disease, unspecified: Secondary | ICD-10-CM

## 2012-10-04 NOTE — Patient Instructions (Signed)
We are going to arrange for a stress test (stress Myoview)  Don't smoke!!  Have the company nurse keep a check on your BP. Goal is less than 140/90.  Call the [facility] office at [phone] if you have any questions, problems or concerns.

## 2012-10-04 NOTE — Progress Notes (Signed)
Jason Munoz Date of Birth: 03/17/1955 Medical Record #[id number]  History of Present Illness: Jason Munoz is seen back today for a follow up visit. Seen for Dr. Swaziland. Has known CAD with single vessel CAD with stenting of the proximal LAD in 2006 with a Vision BMS placed. Last stress test in 2007. Other issues include GERD and HLD. Sees Dr. Jacky Kindle for primary care and his labs.   Has not been seen since April of 2012. Stress echo done at that time.   Comes back today. Here alone. Doing ok. Has noted that he has had perhaps 5 to 6 spells of chest tightness associated with SOB. Last one was maybe a month ago. Started perhaps at the beginning of the year. Last for just a minute or so. Anxious since he has a heart condition. No NTG use. Back smoking cigars. Not exercising as much as he was. More stress with going thru divorce. BP has been good by the company nurse. Dr. Jacky Kindle has been regularly checking his labs.   Current Outpatient Prescriptions  Medication Sig Dispense Refill  . aspirin 81 MG tablet Take 81 mg by mouth daily.        Marland Kitchen atorvastatin (LIPITOR) 20 MG tablet TAKE 1 TABLET (20 MG TOTAL) BY MOUTH DAILY.  90 tablet  3  . esomeprazole (NEXIUM) 40 MG packet Take 40 mg by mouth daily before breakfast.  30 each  12  . metoprolol succinate (TOPROL-XL) 25 MG 24 hr tablet Take 25 mg by mouth daily.        Marland Kitchen NITROSTAT 0.4 MG SL tablet PLACE 1 TABLET (0.4 MG TOTAL) UNDER THE TONGUE EVERY 5 (FIVE) MINUTES AS NEEDED FOR CHEST PAIN.  25 tablet  0   No current facility-administered medications for this visit.    No Known Allergies  Past Medical History  Diagnosis Date  . Coronary artery disease 12/06    stent LAD in 2006  . Hypercholesteremia   . Colitis   . HTN (hypertension)   . GERD (gastroesophageal reflux disease)     Past Surgical History  Procedure Laterality Date  . Appendectomy    . Bilateral ankle fractures    . External ear surgery      History  Smoking status    . Former Smoker  . Quit date: 05/19/1996  Smokeless tobacco  . Not on file    History  Alcohol Use No    History reviewed. No pertinent family history.  Review of Systems: The review of systems is per the HPI.  All other systems were reviewed and are negative.  Physical Exam: BP 170/100  Pulse 84  Ht 5\' 7"  (1.702 m)  Wt 178 lb 12.8 oz (81.103 kg)  BMI 28 kg/m2 BP by me is 130/90. Patient is very pleasant and in no acute distress. BP quite high. Skin is warm and dry. Color is normal.  HEENT is unremarkable. Normocephalic/atraumatic. PERRL. Sclera are nonicteric. Neck is supple. No masses. No JVD. Lungs are clear. Cardiac exam shows a regular rate and rhythm. Abdomen is soft. Extremities are without edema. Gait and ROM are intact. No gross neurologic deficits noted.  LABORATORY DATA: EKG today shows sinus rhythm. No acute changes.    No results found for this basename: WBC,  HGB,  HCT,  PLT,  GLUCOSE,  CHOL,  TRIG,  HDL,  LDLDIRECT,  LDLCALC,  ALT,  AST,  NA,  K,  CL,  CREATININE,  BUN,  CO2,  TSH,  PSA,  INR,  GLUF,  HGBA1C,  MICROALBUR     Assessment / Plan: 1. Chest pain/known CAD - unfortunately, back smoking cigars, has gotten lax with exercise - will update his stress test - arrange for stress Myoview. Smoking cessation encouraged.   2. HLD - labs monitored by Dr. Jacky Kindle.   3. HTN - BP improved by me check. He will continue to monitor as an outpatient.    Patient is agreeable to this plan and will call if any problems develop in the interim.   Rosalio Macadamia, RN, ANP-C [facility] [address] [place], [place]  [address]

## 2012-10-18 ENCOUNTER — Ambulatory Visit (HOSPITAL_COMMUNITY): Payer: 59 | Attending: Nurse Practitioner | Admitting: Radiology

## 2012-10-18 VITALS — BP 132/88 | HR 78 | Ht 67.0 in | Wt 171.0 lb

## 2012-10-18 DIAGNOSIS — I2510 Atherosclerotic heart disease of native coronary artery without angina pectoris: Secondary | ICD-10-CM

## 2012-10-18 DIAGNOSIS — I259 Chronic ischemic heart disease, unspecified: Secondary | ICD-10-CM

## 2012-10-18 DIAGNOSIS — R0602 Shortness of breath: Secondary | ICD-10-CM | POA: Insufficient documentation

## 2012-10-18 DIAGNOSIS — R0789 Other chest pain: Secondary | ICD-10-CM | POA: Insufficient documentation

## 2012-10-18 DIAGNOSIS — I10 Essential (primary) hypertension: Secondary | ICD-10-CM

## 2012-10-18 DIAGNOSIS — F17200 Nicotine dependence, unspecified, uncomplicated: Secondary | ICD-10-CM | POA: Insufficient documentation

## 2012-10-18 DIAGNOSIS — R079 Chest pain, unspecified: Secondary | ICD-10-CM

## 2012-10-18 DIAGNOSIS — E785 Hyperlipidemia, unspecified: Secondary | ICD-10-CM | POA: Insufficient documentation

## 2012-10-18 DIAGNOSIS — R5381 Other malaise: Secondary | ICD-10-CM | POA: Insufficient documentation

## 2012-10-18 MED ORDER — TECHNETIUM TC 99M SESTAMIBI GENERIC - CARDIOLITE
10.0000 | Freq: Once | INTRAVENOUS | Status: AC | PRN
  Administered 2012-10-18: 10 via INTRAVENOUS

## 2012-10-18 MED ORDER — TECHNETIUM TC 99M SESTAMIBI GENERIC - CARDIOLITE
30.0000 | Freq: Once | INTRAVENOUS | Status: AC | PRN
  Administered 2012-10-18: 30 via INTRAVENOUS

## 2012-10-18 NOTE — Progress Notes (Signed)
[facility] SITE 3 NUCLEAR MED [address] [place], [place] [address] [phone]    Cardiology Nuclear Med Study  Jason Munoz is a 57 y.o. male     MRN : [id number]     DOB: [birth year]  Procedure Date: 10/18/2012  Nuclear Med Background Indication for Stress Test:  Evaluation for Ischemia and Stent Patency History:  '06 Stent-LAD; '07 OZH:[id number], EF=74% Cardiac Risk Factors: Hypertension, Lipids and Smoker-Occasional Cigar  Symptoms:  Chest Tightness (last episode of chest discomfort was about a month ago), Fatigue and SOB   Nuclear Pre-Procedure Caffeine/Decaff Intake:  None NPO After: 8:00pm   Lungs:  Clear. O2 Sat: 96% on room air. IV 0.9% NS with Angio Cath:  20g  IV Site: R Hand  IV Started by:  Cathlyn Parsons, RN  Chest Size (in):  42 Cup Size: n/a  Height: 5\' 7"  (1.702 m)  Weight:  171 lb (77.565 kg)  BMI:  Body mass index is 26.78 kg/(m^2). Tech Comments:  No Toprol x 48 hrs   Nuclear Med Study 1 or 2 day study: 1 day  Stress Test Type:  Stress  Reading MD: Kristeen Miss, MD  Order Authorizing Provider:  Peter Swaziland, MD  Resting Radionuclide: Technetium 76m Sestamibi  Resting Radionuclide Dose: 11.0 mCi   Stress Radionuclide:  Technetium 37m Sestamibi  Stress Radionuclide Dose: 33.0 mCi           Stress Protocol Rest HR: 78 Stress HR: 146  Rest BP: 132/88 Stress BP: 191/90  Exercise Time (min): 10:00 METS: 11.7   Predicted Max HR: 165 bpm % Max HR: 88.48 bpm Rate Pressure Product: 46962   Dose of Adenosine (mg):  n/a Dose of Lexiscan: n/a mg  Dose of Atropine (mg): n/a Dose of Dobutamine: n/a mcg/kg/min (at max HR)  Stress Test Technologist: Smiley Houseman, CMA-N  Nuclear Technologist:  Domenic Polite, CNMT     Rest Procedure:  Myocardial perfusion imaging was performed at rest 45 minutes following the intravenous administration of Technetium 76m Sestamibi.  Rest ECG: NSR - Normal EKG  Stress Procedure:  The patient exercised on  the treadmill utilizing the Bruce Protocol for ten minutes. The patient stopped due to fatigue and denied any chest pain.  Technetium 29m Sestamibi was injected at peak exercise and myocardial perfusion imaging was performed after a brief delay.  Stress ECG: No significant change from baseline ECG  QPS Raw Data Images:  Mild diaphragmatic attenuation.  Normal left ventricular size. Stress Images:  There is a medium sized area of moderate attenuation of the mid and basal inferior and mid/basal lateral  wall.  The remaining myocardium has normal uptake.       Rest Images:  There is a medium sized area of moderate attenuation of the mid and basal inferior and mid/basal lateral  wall.  The remaining myocardium has normal uptake.    Subtraction (SDS):  No evidence of ischemia.  There is a fixed defect that may be due to a previous inferior lateral MI vs diaphragmatic attenuation. Transient Ischemic Dilatation (Normal <1.22):  n/a Lung/Heart Ratio (Normal <0.45):  0.39  Quantitative Gated Spect Images QGS EDV:  90 ml QGS ESV:  32 ml  Impression Exercise Capacity:  Excellent exercise capacity. BP Response:  Normal blood pressure response. Clinical Symptoms:  No significant symptoms noted. ECG Impression:  No significant ST segment change suggestive of ischemia. Comparison with Prior Nuclear Study: The fixed inferior lateral defect is new from the previous Myoview  study 01/21/06.  Overall Impression:  Low risk stress nuclear study .  There is a fixed defect in the inferior lateral walls that may be due to a previous inferior lateral MI vs. diaphragmatic attenuation.    The inferior and lateral walls contract normally. .  LV Ejection Fraction: 65%.  LV Wall Motion:  NL LV Function; NL Wall Motion.   Vesta Mixer, Montez Hageman., MD, [facility] 10/18/2012, 4:50 PM Office - [phone] Pager [phone]

## 2012-11-03 ENCOUNTER — Other Ambulatory Visit: Payer: Self-pay

## 2012-11-03 MED ORDER — NITROGLYCERIN 0.4 MG SL SUBL: SUBLINGUAL_TABLET | SUBLINGUAL | Status: DC

## 2013-07-24 ENCOUNTER — Other Ambulatory Visit: Payer: Self-pay | Admitting: Cardiology

## 2013-08-13 ENCOUNTER — Encounter (HOSPITAL_COMMUNITY): Payer: Self-pay | Admitting: Emergency Medicine

## 2013-08-13 ENCOUNTER — Emergency Department (HOSPITAL_COMMUNITY)
Admission: EM | Admit: 2013-08-13 | Discharge: 2013-08-13 | Disposition: A | Discharge status: Home / Self Care | Payer: 59 | Attending: Emergency Medicine | Admitting: Emergency Medicine

## 2013-08-13 DIAGNOSIS — Z8781 Personal history of (healed) traumatic fracture: Secondary | ICD-10-CM | POA: Insufficient documentation

## 2013-08-13 DIAGNOSIS — E785 Hyperlipidemia, unspecified: Secondary | ICD-10-CM | POA: Insufficient documentation

## 2013-08-13 DIAGNOSIS — Z87891 Personal history of nicotine dependence: Secondary | ICD-10-CM | POA: Insufficient documentation

## 2013-08-13 DIAGNOSIS — Z7982 Long term (current) use of aspirin: Secondary | ICD-10-CM | POA: Insufficient documentation

## 2013-08-13 DIAGNOSIS — K219 Gastro-esophageal reflux disease without esophagitis: Secondary | ICD-10-CM | POA: Insufficient documentation

## 2013-08-13 DIAGNOSIS — R040 Epistaxis: Secondary | ICD-10-CM | POA: Insufficient documentation

## 2013-08-13 DIAGNOSIS — I10 Essential (primary) hypertension: Secondary | ICD-10-CM | POA: Insufficient documentation

## 2013-08-13 DIAGNOSIS — I2510 Atherosclerotic heart disease of native coronary artery without angina pectoris: Secondary | ICD-10-CM | POA: Insufficient documentation

## 2013-08-13 DIAGNOSIS — Z79899 Other long term (current) drug therapy: Secondary | ICD-10-CM | POA: Insufficient documentation

## 2013-08-13 MED ORDER — OXYMETAZOLINE HCL 0.05 % NA SOLN
1.0000 | Freq: Once | NASAL | Status: AC
  Administered 2013-08-13: 1 via NASAL
  Filled 2013-08-13: qty 15

## 2013-08-13 NOTE — ED Notes (Signed)
Reports having nosebleed from bilateral nare since 10am. Went to an ucc an told to come here for further eval. Only blood thinner is ASA and bp is 171/104 at triage.

## 2013-08-13 NOTE — ED Notes (Signed)
MD at bedside. 

## 2013-08-13 NOTE — ED Notes (Signed)
NAD noted. Bleeding well under control. Pt given discharge instructions. All questions identified and answered. Pt discharged home with spouse. Pt ambulatory on discharge.

## 2013-08-13 NOTE — ED Provider Notes (Signed)
CSN: [id number]     Arrival date & time 08/13/13  1142 History   First MD Initiated Contact with Patient 08/13/13 1206     Chief Complaint  Patient presents with  . Epistaxis     (Consider location/radiation/quality/duration/timing/severity/associated sxs/prior Treatment) HPI  [age]y male with epistaxis. Onset around 10 AM this morning. Denies any trauma. It seems to be coming from bilateral nares. On 81 mg of aspirin daily, otherwise no blood thinners. No history of recurrent nosebleed. No dizziness, lightheadedness or shortness of breath. Has not noticed any bleeding from his gums, blood in his stool or easy bruising.  Past Medical History  Diagnosis Date  . Coronary artery disease 12/06    stent LAD in 2006  . Hypercholesteremia   . Colitis   . HTN (hypertension)   . GERD (gastroesophageal reflux disease)    Past Surgical History  Procedure Laterality Date  . Appendectomy    . Bilateral ankle fractures    . External ear surgery     History reviewed. No pertinent family history. History  Substance Use Topics  . Smoking status: Former Smoker    Quit date: 05/19/1996  . Smokeless tobacco: Not on file  . Alcohol Use: No    Review of Systems  All systems reviewed and negative, other than as noted in HPI.   Allergies  Review of patient's allergies indicates no known allergies.  Home Medications   Prior to Admission medications   Medication Sig Start Date End Date Taking? Authorizing Provider  aspirin EC 81 MG tablet Take 81 mg by mouth daily.   Yes Historical Provider, MD  atorvastatin (LIPITOR) 20 MG tablet Take 20 mg by mouth daily.   Yes Historical Provider, MD  esomeprazole (NEXIUM) 40 MG packet Take 40 mg by mouth daily before breakfast. 05/20/10  Yes Peter M SwazilandJordan, MD  metoprolol succinate (TOPROL-XL) 25 MG 24 hr tablet Take 25 mg by mouth daily.     Yes Historical Provider, MD  Multiple Vitamins-Minerals (MULTIVITAMIN PO) Take 1 tablet by mouth daily.   Yes  Historical Provider, MD  nitroGLYCERIN (NITROSTAT) 0.4 MG SL tablet Place 0.4 mg under the tongue every 5 (five) minutes as needed for chest pain.   Yes Historical Provider, MD   BP 155/104  Pulse 96  Temp(Src) 97.7 F (36.5 C) (Oral)  Resp 12  Ht 5\' 7"  (1.702 m)  Wt 171 lb 5 oz (77.707 kg)  BMI 26.83 kg/m2  SpO2 97% Physical Exam  Nursing note and vitals reviewed. Constitutional: He appears well-developed and well-nourished. No distress.  HENT:  Head: Normocephalic.  Dried blood b/l nares. Some clot R nare. Suctioned. Mild oozing from R nare after. Blood posterior pharynx. No discrete source identified.   Eyes: Conjunctivae are normal. Right eye exhibits no discharge. Left eye exhibits no discharge.  Neck: Neck supple.  Cardiovascular: Normal rate, regular rhythm and normal heart sounds.  Exam reveals no gallop and no friction rub.   No murmur heard. Pulmonary/Chest: Effort normal and breath sounds normal. No respiratory distress.  Abdominal: Soft. He exhibits no distension. There is no tenderness.  Musculoskeletal: He exhibits no edema and no tenderness.  Neurological: He is alert.  Skin: Skin is warm and dry.  Psychiatric: He has a normal mood and affect. His behavior is normal. Thought content normal.    ED Course  EPISTAXIS MANAGEMENT Date/Time: 08/13/2013 12:15 PM Performed by: Raeford RazorKOHUT,  Authorized by: Raeford RazorKOHUT,  Consent: Verbal consent obtained. Risks and benefits: risks, benefits and  alternatives were discussed Consent given by: patient Patient identity confirmed: verbally with patient and provided demographic data Patient sedated: no Treatment site: right posterior Repair method: suction Treatment complexity: simple Patient tolerance: Patient tolerated the procedure well with no immediate complications. Comments: Clot exacuated. Posterior bleed. Exact area not identified, but more bleeding noted from R nare. Afrin sprayed b/l. Pt instructed to hold  pressure.     (including critical care time) Labs Review Labs Reviewed - No data to display  Imaging Review No results found.   EKG Interpretation None      MDM   Final diagnoses:  None    58 year old male with epistaxis. Bleeding controlled with administration of Afrin. Observed for 30 minutes after without evidence rebleeding. No dizziness, lightheadedness or other symptoms to suggest significant blood loss. No blood thinners aside from a baby aspirin. Feels stable for discharge at this time. Hypertension noted. History the same. Instructed to keep a log of his blood pressures for his next followup appointment with his PCP.    Raeford RazorStephen , MD 08/13/13 1258

## 2013-08-13 NOTE — Discharge Instructions (Signed)
Nosebleed  Nosebleeds can be caused by many conditions including trauma, infections, polyps, foreign bodies, dry mucous membranes or climate, medications and air conditioning. Most nosebleeds occur in the front of the nose. It is because of this location that most nosebleeds can be controlled by pinching the nostrils gently and continuously. Do this for at least 10 to 20 minutes. The reason for this long continuous pressure is that you must hold it long enough for the blood to clot. If during that 10 to 20 minute time period, pressure is released, the process may have to be started again. The nosebleed may stop by itself, quit with pressure, need concentrated heating (cautery) or stop with pressure from packing.  HOME CARE INSTRUCTIONS    If your nose was packed, try to maintain the pack inside until your caregiver removes it. If a gauze pack was used and it starts to fall out, gently replace or cut the end off. Do not cut if a balloon catheter was used to pack the nose. Otherwise, do not remove unless instructed.   Avoid blowing your nose for 12 hours after treatment. This could dislodge the pack or clot and start bleeding again.   If the bleeding starts again, sit up and bending forward, gently pinch the front half of your nose continuously for 20 minutes.   If bleeding was caused by dry mucous membranes, cover the inside of your nose every morning with a petroleum or antibiotic ointment. Use your little fingertip as an applicator. Do this as needed during dry weather. This will keep the mucous membranes moist and allow them to heal.   Maintain humidity in your home by using less air conditioning or using a humidifier.   Do not use aspirin or medications which make bleeding more likely. Your caregiver can give you recommendations on this.   Resume normal activities as able but try to avoid straining, lifting or bending at the waist for several days.   If the nosebleeds become recurrent and the cause is  unknown, your caregiver may suggest laboratory tests.  SEEK IMMEDIATE MEDICAL CARE IF:    Bleeding recurs and cannot be controlled.   There is unusual bleeding from or bruising on other parts of the body.   You have a fever.   Nosebleeds continue.   There is any worsening of the condition which originally brought you in.   You become lightheaded, feel faint, become sweaty or vomit blood.  MAKE SURE YOU:    Understand these instructions.   Will watch your condition.   Will get help right away if you are not doing well or get worse.  Document Released: 11/12/2004 Document Revised: 04/27/2011 Document Reviewed: 01/04/2009  ExitCare Patient Information 2015 [employer]. This information is not intended to replace advice given to you by your health care provider. Make sure you discuss any questions you have with your health care provider.

## 2013-08-23 ENCOUNTER — Encounter (HOSPITAL_COMMUNITY): Payer: Self-pay | Admitting: Emergency Medicine

## 2013-08-23 ENCOUNTER — Emergency Department (HOSPITAL_COMMUNITY)
Admission: EM | Admit: 2013-08-23 | Discharge: 2013-08-23 | Disposition: A | Discharge status: Home / Self Care | Payer: 59 | Attending: Emergency Medicine | Admitting: Emergency Medicine

## 2013-08-23 DIAGNOSIS — E7800 Pure hypercholesterolemia, unspecified: Secondary | ICD-10-CM | POA: Insufficient documentation

## 2013-08-23 DIAGNOSIS — Z87891 Personal history of nicotine dependence: Secondary | ICD-10-CM | POA: Insufficient documentation

## 2013-08-23 DIAGNOSIS — Z7982 Long term (current) use of aspirin: Secondary | ICD-10-CM | POA: Insufficient documentation

## 2013-08-23 DIAGNOSIS — K219 Gastro-esophageal reflux disease without esophagitis: Secondary | ICD-10-CM | POA: Insufficient documentation

## 2013-08-23 DIAGNOSIS — R040 Epistaxis: Secondary | ICD-10-CM

## 2013-08-23 DIAGNOSIS — Z79899 Other long term (current) drug therapy: Secondary | ICD-10-CM | POA: Insufficient documentation

## 2013-08-23 DIAGNOSIS — I2510 Atherosclerotic heart disease of native coronary artery without angina pectoris: Secondary | ICD-10-CM | POA: Insufficient documentation

## 2013-08-23 DIAGNOSIS — I10 Essential (primary) hypertension: Secondary | ICD-10-CM | POA: Insufficient documentation

## 2013-08-23 LAB — COMPREHENSIVE METABOLIC PANEL
ALT: 51 U/L (ref 0–53)
AST: 37 U/L (ref 0–37)
Albumin: 3.5 g/dL (ref 3.5–5.2)
Alkaline Phosphatase: 91 U/L (ref 39–117)
Anion gap: 17 — ABNORMAL HIGH (ref 5–15)
BUN: 10 mg/dL (ref 6–23)
CO2: 22 mEq/L (ref 19–32)
Calcium: 9.2 mg/dL (ref 8.4–10.5)
Chloride: 103 mEq/L (ref 96–112)
Creatinine, Ser: 0.84 mg/dL (ref 0.50–1.35)
GFR calc Af Amer: >90 mL/min (ref >90)
GFR calc non Af Amer: >90 mL/min (ref >90)
Glucose, Bld: 98 mg/dL (ref 70–99)
Potassium: 4.2 mEq/L (ref 3.7–5.3)
Sodium: 142 mEq/L (ref 137–147)
Total Bilirubin: 0.4 mg/dL (ref 0.3–1.2)
Total Protein: 6.7 g/dL (ref 6.0–8.3)

## 2013-08-23 LAB — CBC WITH DIFFERENTIAL/PLATELET
Basophils Absolute: 0 10*3/uL (ref 0.0–0.1)
Basophils Relative: 0 % (ref 0–1)
Eosinophils Absolute: 0.1 10*3/uL (ref 0.0–0.7)
Eosinophils Relative: 2 % (ref 0–5)
HCT: 43.3 % (ref 39.0–52.0)
Hemoglobin: 14.6 g/dL (ref 13.0–17.0)
Lymphocytes Relative: 21 % (ref 12–46)
Lymphs Abs: 1.4 10*3/uL (ref 0.7–4.0)
MCH: 31.9 pg (ref 26.0–34.0)
MCHC: 33.7 g/dL (ref 30.0–36.0)
MCV: 94.5 fL (ref 78.0–100.0)
Monocytes Absolute: 1.1 10*3/uL — ABNORMAL HIGH (ref 0.1–1.0)
Monocytes Relative: 16 % — ABNORMAL HIGH (ref 3–12)
Neutro Abs: 4.2 10*3/uL (ref 1.7–7.7)
Neutrophils Relative %: 61 % (ref 43–77)
Platelets: 257 10*3/uL (ref 150–400)
RBC: 4.58 MIL/uL (ref 4.22–5.81)
RDW: 13.9 % (ref 11.5–15.5)
WBC: 6.8 10*3/uL (ref 4.0–10.5)

## 2013-08-23 LAB — PROTIME-INR
INR: 0.93 (ref 0.00–1.49)
Prothrombin Time: 12.5 seconds (ref 11.6–15.2)

## 2013-08-23 MED ORDER — OXYCODONE-ACETAMINOPHEN 5-325 MG PO TABS: 2.0000 | ORAL_TABLET | ORAL | Status: DC | PRN

## 2013-08-23 MED ORDER — AMOXICILLIN-POT CLAVULANATE 875-125 MG PO TABS: 1.0000 | ORAL_TABLET | Freq: Two times a day (BID) | ORAL | Status: DC

## 2013-08-23 MED ORDER — AMOXICILLIN-POT CLAVULANATE 875-125 MG PO TABS
1.0000 | ORAL_TABLET | Freq: Once | ORAL | Status: AC
  Administered 2013-08-23: 1 via ORAL
  Filled 2013-08-23: qty 1

## 2013-08-23 NOTE — Discharge Instructions (Signed)
Nosebleed  Nosebleeds can be caused by many conditions including trauma, infections, polyps, foreign bodies, dry mucous membranes or climate, medications and air conditioning. Most nosebleeds occur in the front of the nose. It is because of this location that most nosebleeds can be controlled by pinching the nostrils gently and continuously. Do this for at least 10 to 20 minutes. The reason for this long continuous pressure is that you must hold it long enough for the blood to clot. If during that 10 to 20 minute time period, pressure is released, the process may have to be started again. The nosebleed may stop by itself, quit with pressure, need concentrated heating (cautery) or stop with pressure from packing.  HOME CARE INSTRUCTIONS    If your nose was packed, try to maintain the pack inside until your caregiver removes it. If a gauze pack was used and it starts to fall out, gently replace or cut the end off. Do not cut if a balloon catheter was used to pack the nose. Otherwise, do not remove unless instructed.   Avoid blowing your nose for 12 hours after treatment. This could dislodge the pack or clot and start bleeding again.   If the bleeding starts again, sit up and bending forward, gently pinch the front half of your nose continuously for 20 minutes.   If bleeding was caused by dry mucous membranes, cover the inside of your nose every morning with a petroleum or antibiotic ointment. Use your little fingertip as an applicator. Do this as needed during dry weather. This will keep the mucous membranes moist and allow them to heal.   Maintain humidity in your home by using less air conditioning or using a humidifier.   Do not use aspirin or medications which make bleeding more likely. Your caregiver can give you recommendations on this.   Resume normal activities as able but try to avoid straining, lifting or bending at the waist for several days.   If the nosebleeds become recurrent and the cause is  unknown, your caregiver may suggest laboratory tests.  SEEK IMMEDIATE MEDICAL CARE IF:    Bleeding recurs and cannot be controlled.   There is unusual bleeding from or bruising on other parts of the body.   You have a fever.   Nosebleeds continue.   There is any worsening of the condition which originally brought you in.   You become lightheaded, feel faint, become sweaty or vomit blood.  MAKE SURE YOU:    Understand these instructions.   Will watch your condition.   Will get help right away if you are not doing well or get worse.  Document Released: 11/12/2004 Document Revised: 04/27/2011 Document Reviewed: 01/04/2009  ExitCare Patient Information 2015 [employer]. This information is not intended to replace advice given to you by your health care provider. Make sure you discuss any questions you have with your health care provider.

## 2013-08-23 NOTE — ED Notes (Signed)
Presents with left nare epistaxis began at 11 pm, bleeding has slowed since. Denies use of blood thinners. Here last week for same.

## 2013-08-23 NOTE — ED Notes (Signed)
Pt states that he has been having nose bleeds for three day, with three nose bleeds today.  Pt states he was able to get the last two stopped by placing toilet tissue in his nose and applying pressure.

## 2013-08-23 NOTE — ED Provider Notes (Addendum)
CSN: [id number]     Arrival date & time 08/23/13  0217 History   First MD Initiated Contact with Patient 08/23/13 [phone]     Chief Complaint  Patient presents with  . Epistaxis     (Consider location/radiation/quality/duration/timing/severity/associated sxs/prior Treatment) HPI 58 yo male presents to the ER from home with complaint of nosebleed.  He has had intermittent nosebleed for the last 3 days, seen last week for same.  He is pending an office visit with Dr Jenne PaneBates.  Pt has had persistent bleeding for the last 3 hours despite pressure.  Blood appears to be coming from right nare mainly.  Not on blood thinners.  No prior nasal surgeries. Past Medical History  Diagnosis Date  . Coronary artery disease 12/06    stent LAD in 2006  . Hypercholesteremia   . Colitis   . HTN (hypertension)   . GERD (gastroesophageal reflux disease)    Past Surgical History  Procedure Laterality Date  . Appendectomy    . Bilateral ankle fractures    . External ear surgery     History reviewed. No pertinent family history. History  Substance Use Topics  . Smoking status: Former Smoker    Quit date: 05/19/1996  . Smokeless tobacco: Not on file  . Alcohol Use: No    Review of Systems  All other systems reviewed and are negative.     Allergies  Review of patient's allergies indicates no known allergies.  Home Medications   Prior to Admission medications   Medication Sig Start Date End Date Taking? Authorizing Provider  aspirin EC 81 MG tablet Take 81 mg by mouth daily.   Yes Historical Provider, MD  atorvastatin (LIPITOR) 20 MG tablet Take 20 mg by mouth daily.   Yes Historical Provider, MD  esomeprazole (NEXIUM) 40 MG packet Take 40 mg by mouth daily before breakfast. 05/20/10  Yes Peter M SwazilandJordan, MD  HYDROcodone-acetaminophen (NORCO/VICODIN) 5-325 MG per tablet Take 1 tablet by mouth daily as needed for moderate pain.   Yes Historical Provider, MD  metoprolol succinate (TOPROL-XL) 25 MG 24 hr  tablet Take 25 mg by mouth daily.     Yes Historical Provider, MD  Multiple Vitamins-Minerals (MULTIVITAMIN PO) Take 1 tablet by mouth daily.   Yes Historical Provider, MD  nitroGLYCERIN (NITROSTAT) 0.4 MG SL tablet Place 0.4 mg under the tongue every 5 (five) minutes as needed for chest pain.   Yes Historical Provider, MD  amoxicillin-clavulanate (AUGMENTIN) 875-125 MG per tablet Take 1 tablet by mouth 2 (two) times daily. 08/23/13   Olivia Mackielga M , MD  oxyCODONE-acetaminophen (PERCOCET/ROXICET) 5-325 MG per tablet Take 2 tablets by mouth every 4 (four) hours as needed for severe pain. 08/23/13   Olivia Mackielga M , MD   BP 132/74  Pulse 65  Temp(Src) 98.4 F (36.9 C) (Oral)  Resp 16  SpO2 98% Physical Exam  Constitutional: He is oriented to person, place, and time. He appears distressed (agitated, anxious).  HENT:  Head: Normocephalic and atraumatic.  Right Ear: External ear normal.  Left Ear: External ear normal.  Bleeding from right nare and in posterior pharynx.  Unable to see source of bleeding  Eyes: Conjunctivae and EOM are normal. Pupils are equal, round, and reactive to light.  Neck: Normal range of motion. Neck supple. No JVD present. No tracheal deviation present. No thyromegaly present.  Cardiovascular: Normal rate, regular rhythm, normal heart sounds and intact distal pulses.  Exam reveals no gallop and no friction rub.  No murmur heard. Pulmonary/Chest: Effort normal and breath sounds normal. No stridor. No respiratory distress. He has no wheezes. He has no rales. He exhibits no tenderness.  Musculoskeletal: Normal range of motion. He exhibits no edema and no tenderness.  Lymphadenopathy:    He has no cervical adenopathy.  Neurological: He is alert and oriented to person, place, and time. He exhibits normal muscle tone. Coordination normal.  Skin: Skin is warm and dry. No rash noted. No erythema. No pallor.    ED Course  EPISTAXIS MANAGEMENT Date/Time: 08/23/2013 3:45 AM Performed  by: Olivia MackieTTER,  M Authorized by: Olivia MackieTTER,  M Consent: Verbal consent obtained. Risks and benefits: risks, benefits and alternatives were discussed Required items: required blood products, implants, devices, and special equipment available Preparation: Patient was prepped and draped in the usual sterile fashion. Anesthesia: local infiltration Local anesthetic: topical anesthetic Patient sedated: no Treatment site: left anterior and left posterior Repair method: nasal balloon and merocel sponge Post-procedure assessment: bleeding stopped Treatment complexity: complex Recurrence: recurrence of recent bleed Patient tolerance: Patient tolerated the procedure well with no immediate complications. Comments: Merocel packing placed, patient with persistent bleeding around the packing.  After Rhino Rocket anterior posterior packing placed with an inflation of both balloons, patient with cessation of bleeding.   (including critical care time) Labs Review Labs Reviewed  CBC WITH DIFFERENTIAL - Abnormal; Notable for the following:    Monocytes Relative 16 (*)    Monocytes Absolute 1.1 (*)    All other components within normal limits  COMPREHENSIVE METABOLIC PANEL - Abnormal; Notable for the following:    Anion gap 17 (*)    All other components within normal limits  PROTIME-INR    Imaging Review No results found.   EKG Interpretation None      MDM   Final diagnoses:  Epistaxis, recurrent    58 year old male with recurrent nosebleed.  No improvement with Afrin.  Patient reports intermittent bleeding for the last 3 days, with 3 nose bleeds today.  Packing placed, nosebleed has stopped.  Patient already has appointment scheduled with Dr. Jenne PaneBates on Monday.  Will start on Augmentin to prevent infection.  Patient given return precautions.  Olivia Mackielga M , MD 08/23/13 [id number]  Olivia Mackielga M , MD 09/06/13 [phone]

## 2013-11-17 ENCOUNTER — Encounter: Payer: Self-pay | Admitting: Internal Medicine

## 2014-03-16 ENCOUNTER — Ambulatory Visit (INDEPENDENT_AMBULATORY_CARE_PROVIDER_SITE_OTHER): Payer: Self-pay | Admitting: Surgery

## 2014-06-08 ENCOUNTER — Ambulatory Visit (INDEPENDENT_AMBULATORY_CARE_PROVIDER_SITE_OTHER): Payer: 59 | Admitting: Podiatry

## 2014-06-08 ENCOUNTER — Encounter: Payer: Self-pay | Admitting: Podiatry

## 2014-06-08 ENCOUNTER — Ambulatory Visit (INDEPENDENT_AMBULATORY_CARE_PROVIDER_SITE_OTHER): Payer: 59

## 2014-06-08 VITALS — Ht 67.0 in | Wt 190.0 lb

## 2014-06-08 DIAGNOSIS — M722 Plantar fascial fibromatosis: Secondary | ICD-10-CM | POA: Diagnosis not present

## 2014-06-08 DIAGNOSIS — M79671 Pain in right foot: Secondary | ICD-10-CM

## 2014-06-08 MED ORDER — TRIAMCINOLONE ACETONIDE 10 MG/ML IJ SUSP
10.0000 mg | Freq: Once | INTRAMUSCULAR | Status: AC
  Administered 2014-06-08: 10 mg

## 2014-06-08 NOTE — Progress Notes (Signed)
   Subjective:    Patient ID: Jason Munoz, male    DOB: 11-Sep-1955, 59 y.o.   MRN: [id number]  HPI 59 year old male presents to the office today with complaints of right heel pain which is been ongoing for greater than one month. He denies any history of injury or trauma to the area and denies any change or increase activity the time of onset of symptoms. Denies any numbness or tingling. Denies any pain at night. He does have pain in the morning which is relieved by ambulation or after periods of prolonged ambulation he does have discomfort. Denies any swelling or redness overlying the area. He doesn't that he has had plantar fasciitis several years ago and this pain feels about the same as what it did previously. No other complaints at this time.   Review of Systems  HENT: Positive for dental problem and tinnitus.        Pt currently has an infected tooth and is on an antibiotic.       Objective:   Physical Exam AAO x3, NAD DP/PT pulses palpable bilaterally, CRT less than 3 seconds Protective sensation intact with Simms Weinstein monofilament, vibratory sensation intact, Achilles tendon reflex intact Tenderness to palpation overlying the plantar medial tubercle of the calcaneus to right heel at the insertion of the plantar fascia. There is no pain along the course of plantar fascial within the arch of the foot. There is no pain with lateral compression of the calcaneus or pain the vibratory sensation. No pain on the posterior aspect of the calcaneus or along the course/insertion of the Achilles tendon. There is no overlying edema, erythema, increase in warmth. No other areas of tenderness palpation or pain with vibratory sensation to the foot/ankle. MMT 5/5, ROM WNL No open lesions or pre-ulcerative lesions are identified. No pain with calf compression, swelling, warmth, erythema.     Assessment & Plan:  59 year old male with right heel pain, plantar fasciitis -Treatment options were  discussed include alternatives, risks, complications -X-rays were obtained and reviewed -Patient elects to proceed with steroid injection into the right heel. Under sterile skin preparation, a total of 2.5cc of kenalog 10, 0.5% Marcaine plain, and 2% lidocaine plain were infiltrated into the symptomatic area without complication. A band-aid was applied. Patient tolerated the injection well without complication. Post-injection care with discussed with the patient. Discussed with the patient to ice the area over the next couple of days to help prevent a steroid flare.  -Dispensed plantar fascial brace -Ice to the area -Stretching exercises -Discussed shoe gear modifications and possible orthotics -Follow-up in 3 weeks or sooner if any problems are to arise. Call questions or concerns.

## 2014-06-08 NOTE — Patient Instructions (Signed)
Plantar Fasciitis (Heel Spur Syndrome) with Rehab The plantar fascia is a fibrous, ligament-like, soft-tissue structure that spans the bottom of the foot. Plantar fasciitis is a condition that causes pain in the foot due to inflammation of the tissue. SYMPTOMS   Pain and tenderness on the underneath side of the foot.  Pain that worsens with standing or walking. CAUSES  Plantar fasciitis is caused by irritation and injury to the plantar fascia on the underneath side of the foot. Common mechanisms of injury include:  Direct trauma to bottom of the foot.  Damage to a small nerve that runs under the foot where the main fascia attaches to the heel bone.  Stress placed on the plantar fascia due to bone spurs. RISK INCREASES WITH:   Activities that place stress on the plantar fascia (running, jumping, pivoting, or cutting).  Poor strength and flexibility.  Improperly fitted shoes.  Tight calf muscles.  Flat feet.  Failure to warm-up properly before activity.  Obesity. PREVENTION  Warm up and stretch properly before activity.  Allow for adequate recovery between workouts.  Maintain physical fitness:  Strength, flexibility, and endurance.  Cardiovascular fitness.  Maintain a health body weight.  Avoid stress on the plantar fascia.  Wear properly fitted shoes, including arch supports for individuals who have flat feet. PROGNOSIS  If treated properly, then the symptoms of plantar fasciitis usually resolve without surgery. However, occasionally surgery is necessary. RELATED COMPLICATIONS   Recurrent symptoms that may result in a chronic condition.  Problems of the lower back that are caused by compensating for the injury, such as limping.  Pain or weakness of the foot during push-off following surgery.  Chronic inflammation, scarring, and partial or complete fascia tear, occurring more often from repeated injections. TREATMENT  Treatment initially involves the use of  ice and medication to help reduce pain and inflammation. The use of strengthening and stretching exercises may help reduce pain with activity, especially stretches of the Achilles tendon. These exercises may be performed at home or with a therapist. Your caregiver may recommend that you use heel cups of arch supports to help reduce stress on the plantar fascia. Occasionally, corticosteroid injections are given to reduce inflammation. If symptoms persist for greater than 6 months despite non-surgical (conservative), then surgery may be recommended.  MEDICATION   If pain medication is necessary, then nonsteroidal anti-inflammatory medications, such as aspirin and ibuprofen, or other minor pain relievers, such as acetaminophen, are often recommended.  Do not take pain medication within 7 days before surgery.  Prescription pain relievers may be given if deemed necessary by your caregiver. Use only as directed and only as much as you need.  Corticosteroid injections may be given by your caregiver. These injections should be reserved for the most serious cases, because they may only be given a certain number of times. HEAT AND COLD  Cold treatment (icing) relieves pain and reduces inflammation. Cold treatment should be applied for 10 to 15 minutes every 2 to 3 hours for inflammation and pain and immediately after any activity that aggravates your symptoms. Use ice packs or massage the area with a piece of ice (ice massage).  Heat treatment may be used prior to performing the stretching and strengthening activities prescribed by your caregiver, physical therapist, or athletic trainer. Use a heat pack or soak the injury in warm water. SEEK IMMEDIATE MEDICAL CARE IF:  Treatment seems to offer no benefit, or the condition worsens.  Any medications produce adverse side effects. EXERCISES RANGE   OF MOTION (ROM) AND STRETCHING EXERCISES - Plantar Fasciitis (Heel Spur Syndrome) These exercises may help you  when beginning to rehabilitate your injury. Your symptoms may resolve with or without further involvement from your physician, physical therapist or athletic trainer. While completing these exercises, remember:   Restoring tissue flexibility helps normal motion to return to the joints. This allows healthier, less painful movement and activity.  An effective stretch should be held for at least 30 seconds.  A stretch should never be painful. You should only feel a gentle lengthening or release in the stretched tissue. RANGE OF MOTION - Toe Extension, Flexion  Sit with your right / left leg crossed over your opposite knee.  Grasp your toes and gently pull them back toward the top of your foot. You should feel a stretch on the bottom of your toes and/or foot.  Hold this stretch for __________ seconds.  Now, gently pull your toes toward the bottom of your foot. You should feel a stretch on the top of your toes and or foot.  Hold this stretch for __________ seconds. Repeat __________ times. Complete this stretch __________ times per day.  RANGE OF MOTION - Ankle Dorsiflexion, Active Assisted  Remove shoes and sit on a chair that is preferably not on a carpeted surface.  Place right / left foot under knee. Extend your opposite leg for support.  Keeping your heel down, slide your right / left foot back toward the chair until you feel a stretch at your ankle or calf. If you do not feel a stretch, slide your bottom forward to the edge of the chair, while still keeping your heel down.  Hold this stretch for __________ seconds. Repeat __________ times. Complete this stretch __________ times per day.  STRETCH - Gastroc, Standing  Place hands on wall.  Extend right / left leg, keeping the front knee somewhat bent.  Slightly point your toes inward on your back foot.  Keeping your right / left heel on the floor and your knee straight, shift your weight toward the wall, not allowing your back to  arch.  You should feel a gentle stretch in the right / left calf. Hold this position for __________ seconds. Repeat __________ times. Complete this stretch __________ times per day. STRETCH - Soleus, Standing  Place hands on wall.  Extend right / left leg, keeping the other knee somewhat bent.  Slightly point your toes inward on your back foot.  Keep your right / left heel on the floor, bend your back knee, and slightly shift your weight over the back leg so that you feel a gentle stretch deep in your back calf.  Hold this position for __________ seconds. Repeat __________ times. Complete this stretch __________ times per day. STRETCH - Gastrocsoleus, Standing  Note: This exercise can place a lot of stress on your foot and ankle. Please complete this exercise only if specifically instructed by your caregiver.   Place the ball of your right / left foot on a step, keeping your other foot firmly on the same step.  Hold on to the wall or a rail for balance.  Slowly lift your other foot, allowing your body weight to press your heel down over the edge of the step.  You should feel a stretch in your right / left calf.  Hold this position for __________ seconds.  Repeat this exercise with a slight bend in your right / left knee. Repeat __________ times. Complete this stretch __________ times per day.    STRENGTHENING EXERCISES - Plantar Fasciitis (Heel Spur Syndrome)  These exercises may help you when beginning to rehabilitate your injury. They may resolve your symptoms with or without further involvement from your physician, physical therapist or athletic trainer. While completing these exercises, remember:   Muscles can gain both the endurance and the strength needed for everyday activities through controlled exercises.  Complete these exercises as instructed by your physician, physical therapist or athletic trainer. Progress the resistance and repetitions only as guided. STRENGTH -  Towel Curls  Sit in a chair positioned on a non-carpeted surface.  Place your foot on a towel, keeping your heel on the floor.  Pull the towel toward your heel by only curling your toes. Keep your heel on the floor.  If instructed by your physician, physical therapist or athletic trainer, add ____________________ at the end of the towel. Repeat __________ times. Complete this exercise __________ times per day. STRENGTH - Ankle Inversion  Secure one end of a rubber exercise band/tubing to a fixed object (table, pole). Loop the other end around your foot just before your toes.  Place your fists between your knees. This will focus your strengthening at your ankle.  Slowly, pull your big toe up and in, making sure the band/tubing is positioned to resist the entire motion.  Hold this position for __________ seconds.  Have your muscles resist the band/tubing as it slowly pulls your foot back to the starting position. Repeat __________ times. Complete this exercises __________ times per day.  Document Released: 02/02/2005 Document Revised: 04/27/2011 Document Reviewed: 05/17/2008 [employer] Patient Information 2015 [employer]. This information is not intended to replace advice given to you by your health care provider. Make sure you discuss any questions you have with your health care provider.  

## 2014-06-29 ENCOUNTER — Ambulatory Visit: Payer: 59 | Admitting: Podiatry

## 2015-02-15 ENCOUNTER — Ambulatory Visit: Payer: 59 | Admitting: Podiatry

## 2015-02-27 ENCOUNTER — Ambulatory Visit: Payer: 59 | Admitting: Podiatry

## 2015-02-27 ENCOUNTER — Ambulatory Visit (INDEPENDENT_AMBULATORY_CARE_PROVIDER_SITE_OTHER): Payer: 59 | Admitting: Podiatry

## 2015-02-27 ENCOUNTER — Encounter: Payer: Self-pay | Admitting: Podiatry

## 2015-02-27 VITALS — BP 110/73 | HR 92 | Resp 16

## 2015-02-27 DIAGNOSIS — M722 Plantar fascial fibromatosis: Secondary | ICD-10-CM

## 2015-02-27 MED ORDER — TRIAMCINOLONE ACETONIDE 10 MG/ML IJ SUSP
10.0000 mg | Freq: Once | INTRAMUSCULAR | Status: AC
  Administered 2015-02-27: 10 mg

## 2015-02-27 NOTE — Patient Instructions (Signed)

## 2015-03-01 NOTE — Progress Notes (Signed)
Subjective:     Patient ID: Jason QuillSteven L Fluegge, male   DOB: 17-Jan-1956, 60 y.o.   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 At this point I did go ahead and I reinjected the plantar fascia 3 mg Kenalog 5 mg Xylocaine and then went ahead and scan for a Berkley type orthotic to try to stabilize the heel and provide arch support and also dispensed fascial brace for temporary usage. Reappoint when orthotics are ready

## 2015-03-06 ENCOUNTER — Other Ambulatory Visit: Payer: Self-pay | Admitting: *Deleted

## 2015-03-06 MED ORDER — NITROGLYCERIN 0.4 MG SL SUBL: 0.4000 mg | SUBLINGUAL_TABLET | SUBLINGUAL | Status: DC | PRN

## 2015-03-20 ENCOUNTER — Ambulatory Visit: Payer: 59 | Admitting: *Deleted

## 2015-03-20 DIAGNOSIS — M722 Plantar fascial fibromatosis: Secondary | ICD-10-CM

## 2015-03-20 NOTE — Progress Notes (Signed)
Patient ID: Jason Munoz, male   DOB: 1955-05-24, 60 y.o.   MRN: [id number] Patient presents for orthotic pick up.  Verbal and written break in and wear instructions given.  Patient will follow up in 4 weeks if symptoms worsen or fail to improve.

## 2015-03-20 NOTE — Patient Instructions (Signed)

## 2015-04-13 ENCOUNTER — Other Ambulatory Visit: Payer: Self-pay | Admitting: Cardiology

## 2015-04-15 NOTE — Telephone Encounter (Signed)
Rx(s) sent to pharmacy electronically.  

## 2015-11-10 ENCOUNTER — Encounter (HOSPITAL_BASED_OUTPATIENT_CLINIC_OR_DEPARTMENT_OTHER): Payer: Self-pay | Admitting: Emergency Medicine

## 2015-11-10 ENCOUNTER — Emergency Department (HOSPITAL_BASED_OUTPATIENT_CLINIC_OR_DEPARTMENT_OTHER)
Admission: EM | Admit: 2015-11-10 | Discharge: 2015-11-10 | Disposition: A | Discharge status: Home / Self Care | Payer: 59 | Attending: Emergency Medicine | Admitting: Emergency Medicine

## 2015-11-10 ENCOUNTER — Emergency Department (HOSPITAL_BASED_OUTPATIENT_CLINIC_OR_DEPARTMENT_OTHER): Payer: 59

## 2015-11-10 DIAGNOSIS — I10 Essential (primary) hypertension: Secondary | ICD-10-CM | POA: Insufficient documentation

## 2015-11-10 DIAGNOSIS — R1032 Left lower quadrant pain: Secondary | ICD-10-CM | POA: Insufficient documentation

## 2015-11-10 DIAGNOSIS — Z7982 Long term (current) use of aspirin: Secondary | ICD-10-CM | POA: Diagnosis not present

## 2015-11-10 DIAGNOSIS — I2510 Atherosclerotic heart disease of native coronary artery without angina pectoris: Secondary | ICD-10-CM | POA: Diagnosis not present

## 2015-11-10 DIAGNOSIS — Z79899 Other long term (current) drug therapy: Secondary | ICD-10-CM | POA: Insufficient documentation

## 2015-11-10 DIAGNOSIS — F17210 Nicotine dependence, cigarettes, uncomplicated: Secondary | ICD-10-CM | POA: Insufficient documentation

## 2015-11-10 LAB — BASIC METABOLIC PANEL
Anion gap: 9 (ref 5–15)
BUN: 10 mg/dL (ref 6–20)
CO2: 26 mmol/L (ref 22–32)
Calcium: 9.2 mg/dL (ref 8.9–10.3)
Chloride: 98 mmol/L — ABNORMAL LOW (ref 101–111)
Creatinine, Ser: 1.33 mg/dL — ABNORMAL HIGH (ref 0.61–1.24)
GFR calc Af Amer: >60 mL/min (ref >60)
GFR calc non Af Amer: 57 mL/min — ABNORMAL LOW (ref >60)
Glucose, Bld: 87 mg/dL (ref 65–99)
Potassium: 4.2 mmol/L (ref 3.5–5.1)
Sodium: 133 mmol/L — ABNORMAL LOW (ref 135–145)

## 2015-11-10 MED ORDER — IOPAMIDOL (ISOVUE-300) INJECTION 61%
100.0000 mL | Freq: Once | INTRAVENOUS | Status: AC | PRN
  Administered 2015-11-10: 100 mL via INTRAVENOUS

## 2015-11-10 MED ORDER — SODIUM CHLORIDE 0.9 % IV BOLUS (SEPSIS)
500.0000 mL | Freq: Once | INTRAVENOUS | Status: AC
  Administered 2015-11-10: 500 mL via INTRAVENOUS

## 2015-11-10 NOTE — ED Provider Notes (Signed)
[facility] Note   CSN: [id number] Arrival date & time: 11/10/15  1438  By signing my name below, I, Phillis Haggis, attest that this documentation has been prepared under the direction and in the presence of Benjiman Core, MD. Electronically Signed: Phillis Haggis, ED Scribe. 11/10/15. 4:24 PM.  History   Chief Complaint Chief Complaint  Patient presents with  . Groin Pain   The history is provided by the patient. No language interpreter was used.   HPI Comments: Jason Munoz is a 60 y.o. Male with a hx of HTN, colitis, and CAD who presents to the Emergency Department complaining of bilateral groin pain that radiates to the lower abdomen onset 5 days ago. Pt took Celebrex for his symptoms to no relief. He was seen at [facility] and referred to the ED for an intestinal infection rule out. Pt had labs performed at [facility]. He reports some testicular pain last night that has since resolved. He denies fever, chills, nausea, vomiting, dysuria, or difficulty urinating. Pt is a smoker.  Past Medical History:  Diagnosis Date  . Colitis   . Coronary artery disease 12/06   stent LAD in 2006  . GERD (gastroesophageal reflux disease)   . HTN (hypertension)   . Hypercholesteremia    Patient Active Problem List   Diagnosis Date Noted  . Coronary artery disease   . Hypercholesteremia   . ESOPHAGITIS, REFLUX 07/22/2007  . GASTROESOPHAGEAL REFLUX DISEASE 07/22/2007  . GASTRIC ULCER, ACUTE 07/22/2007  . ACUT DUOD ULCER W/O MENTION HEMORR PERF/OBST 07/22/2007    Past Surgical History:  Procedure Laterality Date  . APPENDECTOMY    . bilateral ankle fractures    . CARPAL TUNNEL RELEASE    . ELBOW SURGERY Right   . EXTERNAL EAR SURGERY       Home Medications    Prior to Admission medications   Medication Sig Start Date End Date Taking? Authorizing Provider  atorvastatin (LIPITOR) 20 MG tablet Take 20 mg by mouth daily.   Yes Historical Provider, MD    esomeprazole (NEXIUM) 40 MG packet Take 40 mg by mouth daily before breakfast. 05/20/10  Yes Peter M [place], MD  metoprolol succinate (TOPROL-XL) 25 MG 24 hr tablet Take 25 mg by mouth daily.     Yes Historical Provider, MD  aspirin EC 81 MG tablet Take 81 mg by mouth daily.    Historical Provider, MD  Multiple Vitamins-Minerals (MULTIVITAMIN PO) Take 1 tablet by mouth daily.    Historical Provider, MD  nitroGLYCERIN (NITROSTAT) 0.4 MG SL tablet PLACE 1 TABLET (0.4 MG TOTAL) UNDER THE TONGUE EVERY 5 (FIVE) MINUTES AS NEEDED FOR CHEST PAIN. 04/15/15   Peter M [place], MD    Family History History reviewed. No pertinent family history.  Social History Social History  Substance Use Topics  . Smoking status: Current Every Day Smoker    Packs/day: 1.00    Types: Cigarettes    Last attempt to quit: 05/19/1996  . Smokeless tobacco: Never Used  . Alcohol use Yes     Comment: social     Allergies   Review of patient's allergies indicates no known allergies.   Review of Systems Review of Systems  Constitutional: Negative for chills and fever.  Gastrointestinal: Positive for abdominal pain. Negative for nausea and vomiting.  Genitourinary: Negative for difficulty urinating and dysuria.       Bilateral groin pain  All other systems reviewed and are negative.  Physical Exam Updated Vital Signs  BP 128/92 (BP Location: Right Arm)   Pulse 72   Temp 98.3 F (36.8 C) (Oral)   Resp 18   Ht 5\' 7"  (1.702 m)   Wt 180 lb (81.6 kg)   SpO2 100%   BMI 28.19 kg/m   Physical Exam  Constitutional: He is oriented to person, place, and time. He appears well-developed and well-nourished.  HENT:  Head: Normocephalic and atraumatic.  Eyes: EOM are normal. Pupils are equal, round, and reactive to light.  Neck: Normal range of motion. Neck supple.  Cardiovascular: Normal rate, regular rhythm and normal heart sounds.  Exam reveals no gallop and no friction rub.   No murmur heard. Pulmonary/Chest:  Effort normal and breath sounds normal. He has no wheezes.  Abdominal: Soft. There is tenderness in the suprapubic area and left lower quadrant. There is no rebound and no guarding.  Mild suprapubic to LLQ tenderness  Genitourinary:  Genitourinary Comments: No peroneal or testicular tenderness  Musculoskeletal: Normal range of motion.  Neurological: He is alert and oriented to person, place, and time.  Skin: Skin is warm and dry.  Psychiatric: He has a normal mood and affect. His behavior is normal.  Nursing note and vitals reviewed.  ED Treatments / Results  DIAGNOSTIC STUDIES: Oxygen Saturation is 100% on RA, normal by my interpretation.    COORDINATION OF CARE: 4:22 PM-Discussed treatment plan which includes labs and CT scan with pt at bedside and pt agreed to plan.    Labs (all labs ordered are listed, but only abnormal results are displayed) Labs Reviewed - No data to display  EKG  EKG Interpretation None       Radiology No results found.  Procedures Procedures (including critical care time)  Medications Ordered in ED Medications - No data to display   Initial Impression / Assessment and Plan / ED Course  I have reviewed the triage vital signs and the nursing notes.  Pertinent labs & imaging results that were available during my care of the patient were reviewed by me and considered in my medical decision making (see chart for details).  Clinical Course    Patient with abdominal pain. Sent in to rule out diverticulitis. White count of 13 and urine does not show infection at urgent care. No dysuria. CT scan reassuring. Informed of MRI knee for liver and also his aortic diameter. Will follow-up with PCP. Discharge home.  Final Clinical Impressions(s) / ED Diagnoses   Final diagnoses:  None  I personally performed the services described in this documentation, which was scribed in my presence. The recorded information has been reviewed and is accurate.      New Prescriptions New Prescriptions   No medications on file     Benjiman CoreNathan , MD 11/10/15 Ernestina [id number]

## 2015-11-10 NOTE — ED Notes (Signed)
Pt given d/c instructions as per chart. Verbalizes understanding. No questions. 

## 2015-11-10 NOTE — ED Triage Notes (Signed)
Patient with groin pain since Wednesday. Patient has taken celebrex this morning without relief. [facility] and was sent for evaluation for intestinal infection rule out. WBC 13.1 per labs supplied by [facility].

## 2016-02-17 NOTE — Progress Notes (Signed)
Cardiology Office Note    Date:  02/18/2016   ID:  Jason QuillSteven L Hutmacher, DOB 06/02/1955, MRN [id number]  PCP:  Minda MeoARONSON,RICHARD A, MD  Cardiologist:   [place], MD    History of Present Illness:  Jason Munoz is a 61 y.o. male seen for follow up CAD. Last seen in August 2014. Has known CAD with single vessel CAD with stenting of the proximal LAD in 2006 with a Vision BMS placed. He had a normal stress Myoview in 2007. He had a normal stress Echo in 2012. In 2014 he had a myoview study that showed a fixed inferior defect. No ischemia and normal EF. Other issues include GERD and HLD On follow up today he is doing very well from a cardiac standpoint. He denies any chest pain, SOB, palpitations. He is active. He has developed cervical neuralgia and is planning to have cervical fusion with Dr. Danielle DessElsner. He wants to make sure he is safe to have surgery done.  Past Medical History:  Diagnosis Date  . Colitis   . Coronary artery disease 12/06   stent LAD in 2006  . GERD (gastroesophageal reflux disease)   . HTN (hypertension)   . Hypercholesteremia     Past Surgical History:  Procedure Laterality Date  . APPENDECTOMY    . bilateral ankle fractures    . CARPAL TUNNEL RELEASE    . ELBOW SURGERY Right   . EXTERNAL EAR SURGERY      Current Medications: Outpatient Medications Prior to Visit  Medication Sig Dispense Refill  . aspirin EC 81 MG tablet Take 81 mg by mouth daily.    Marland Kitchen. atorvastatin (LIPITOR) 20 MG tablet Take 20 mg by mouth daily.    Marland Kitchen. esomeprazole (NEXIUM) 40 MG packet Take 40 mg by mouth daily before breakfast. 30 each 12  . metoprolol succinate (TOPROL-XL) 25 MG 24 hr tablet Take 25 mg by mouth daily.      . Multiple Vitamins-Minerals (MULTIVITAMIN PO) Take 1 tablet by mouth daily.    . nitroGLYCERIN (NITROSTAT) 0.4 MG SL tablet PLACE 1 TABLET (0.4 MG TOTAL) UNDER THE TONGUE EVERY 5 (FIVE) MINUTES AS NEEDED FOR CHEST PAIN. 25 tablet 0   No facility-administered medications  prior to visit.      Allergies:   Patient has no known allergies.   Social History   Social History  . Marital status: Married    Spouse name: N/A  . Number of children: 3  . Years of education: N/A   Occupational History  .  [employer]   Social History Main Topics  . Smoking status: Current Every Day Smoker    Packs/day: 1.00    Types: Cigarettes    Last attempt to quit: 05/19/1996  . Smokeless tobacco: Never Used  . Alcohol use Yes     Comment: social  . Drug use: No  . Sexual activity: Yes    Birth control/ protection: None   Other Topics Concern  . None   Social History Narrative  . None     Family History:  The patient's family history is not on file.   ROS:   Please see the history of present illness.    ROS All other systems reviewed and are negative.   PHYSICAL EXAM:   VS:  BP 110/78   Pulse 72   Ht 5\' 7"  (1.702 m)   Wt 179 lb (81.2 kg)   BMI 28.04 kg/m    GEN: Well nourished, well developed, in  no acute distress  HEENT: normal  Neck: no JVD, carotid bruits, or masses Cardiac: RRR; no murmurs, rubs, or gallops,no edema  Respiratory:  clear to auscultation bilaterally, normal work of breathing GI: soft, nontender, nondistended, + BS MS: no deformity or atrophy  Skin: warm and dry, no rash Neuro:  Alert and Oriented x 3, Strength and sensation are intact Psych: euthymic mood, full affect  Wt Readings from Last 3 Encounters:  02/18/16 179 lb (81.2 kg)  11/10/15 180 lb (81.6 kg)  06/08/14 190 lb (86.2 kg)      Studies/Labs Reviewed:   EKG:  EKG is ordered today.  The ekg ordered today demonstrates NSR with normal Ecg. I have personally reviewed and interpreted this study.   Recent Labs: 11/10/2015: BUN 10; Creatinine, Ser 1.33; Potassium 4.2; Sodium 133   Lipid Panel No results found for: CHOL, TRIG, HDL, CHOLHDL, VLDL, LDLCALC, LDLDIRECT  Additional studies/ records that were reviewed today include:   Labs dated 01/03/16:  cholesterol 146, triglycerides 114, HDL 61, LDL 62. CMET, CBC, TSH normal. A1c 5.5%   Stress Myoview 10/19/15: Cardiology Nuclear Med Study  Jason Munoz is a 61 y.o. male     MRN : [id number]     DOB: 08-06-1955  Procedure Date: 10/18/2012  Nuclear Med Background Indication for Stress Test:  Evaluation for Ischemia and Stent Patency History:  '06 Stent-LAD; '07 [id number]:[id number], EF=74% Cardiac Risk Factors: Hypertension, Lipids and Smoker-Occasional Cigar  Symptoms:  Chest Tightness (last episode of chest discomfort was about a month ago), Fatigue and SOB   Nuclear Pre-Procedure Caffeine/Decaff Intake:  None NPO After: 8:00pm   Lungs:  Clear. O2 Sat: 96% on room air. IV 0.9% NS with Angio Cath:  20g  IV Site: R Hand  IV Started by:  Cathlyn Parsons, RN  Chest Size (in):  42 Cup Size: n/a  Height: 5\' 7"  (1.702 m)  Weight:  171 lb (77.565 kg)  BMI:  Body mass index is 26.78 kg/(m^2). Tech Comments:  No Toprol x 48 hrs   Nuclear Med Study 1 or 2 day study: 1 day  Stress Test Type:  Stress  Reading MD: Kristeen Miss, MD  Order Authorizing Provider:   [place], MD  Resting Radionuclide: Technetium 34m Sestamibi  Resting Radionuclide Dose: 11.0 mCi   Stress Radionuclide:  Technetium 37m Sestamibi  Stress Radionuclide Dose: 33.0 mCi           Stress Protocol Rest HR: 78 Stress HR: 146  Rest BP: 132/88 Stress BP: 191/90  Exercise Time (min): 10:00 METS: 11.7   Predicted Max HR: 165 bpm % Max HR: 88.48 bpm Rate Pressure Product: 86578   Dose of Adenosine (mg):  n/a Dose of Lexiscan: n/a mg  Dose of Atropine (mg): n/a Dose of Dobutamine: n/a mcg/kg/min (at max HR)  Stress Test Technologist: Smiley Houseman, CMA-N  Nuclear Technologist:  Domenic Polite, CNMT     Rest Procedure:  Myocardial perfusion imaging was performed at rest 45 minutes following the intravenous administration of Technetium 17m Sestamibi.  Rest ECG: NSR - Normal EKG  Stress Procedure:  The  patient exercised on the treadmill utilizing the Bruce Protocol for ten minutes. The patient stopped due to fatigue and denied any chest pain.  Technetium 41m Sestamibi was injected at peak exercise and myocardial perfusion imaging was performed after a brief delay.  Stress ECG: No significant change from baseline ECG  QPS Raw Data Images:  Mild diaphragmatic attenuation.  Normal left ventricular size. Stress Images:  There is a medium sized area of moderate attenuation of the mid and basal inferior and mid/basal lateral  wall.  The remaining myocardium has normal uptake.       Rest Images:  There is a medium sized area of moderate attenuation of the mid and basal inferior and mid/basal lateral  wall.  The remaining myocardium has normal uptake.    Subtraction (SDS):  No evidence of ischemia.  There is a fixed defect that may be due to a previous inferior lateral MI vs diaphragmatic attenuation. Transient Ischemic Dilatation (Normal <1.22):  n/a Lung/Heart Ratio (Normal <0.45):  0.39  Quantitative Gated Spect Images QGS EDV:  90 ml QGS ESV:  32 ml  Impression Exercise Capacity:  Excellent exercise capacity. BP Response:  Normal blood pressure response. Clinical Symptoms:  No significant symptoms noted. ECG Impression:  No significant ST segment change suggestive of ischemia. Comparison with Prior Nuclear Study: The fixed inferior lateral defect is new from the previous Myoview study 01/21/06.  Overall Impression:  Low risk stress nuclear study .  There is a fixed defect in the inferior lateral walls that may be due to a previous inferior lateral MI vs. diaphragmatic attenuation.    The inferior and lateral walls contract normally. .  LV Ejection Fraction: 65%.  LV Wall Motion:  NL LV Function; NL Wall Motion.   Vesta Mixer, Montez Hageman., MD, [facility] 10/18/2012, 4:50 PM Office - [phone] Pager [phone]    ASSESSMENT:    1. Coronary artery disease involving native coronary artery of  native heart without angina pectoris   2. Hypercholesteremia      PLAN:  In order of problems listed above:  1. Patient is asymptomatic from a cardiac standpoint. He is cleared to proceed with cervical fusion. May stop ASA one week prior. Continue other medical therapy.  2. Lipids well controlled on statin. 3. Tobacco use. Smoking <1 pp/wk. Encourage complete cessation.  I will follow up in one year.    Medication Adjustments/Labs and Tests Ordered: Current medicines are reviewed at length with the patient today.  Concerns regarding medicines are outlined above.  Medication changes, Labs and Tests ordered today are listed in the Patient Instructions below. There are no Patient Instructions on file for this visit.   Signed,  [place], MD  02/18/2016 2:25 PM    [facility] [address], [place], [place], [address] [phone]

## 2016-02-18 ENCOUNTER — Encounter: Payer: Self-pay | Admitting: Cardiology

## 2016-02-18 ENCOUNTER — Ambulatory Visit (INDEPENDENT_AMBULATORY_CARE_PROVIDER_SITE_OTHER): Payer: 59 | Admitting: Cardiology

## 2016-02-18 VITALS — BP 110/78 | HR 72 | Ht 67.0 in | Wt 179.0 lb

## 2016-02-18 DIAGNOSIS — E7800 Pure hypercholesterolemia, unspecified: Secondary | ICD-10-CM | POA: Diagnosis not present

## 2016-02-18 DIAGNOSIS — I2510 Atherosclerotic heart disease of native coronary artery without angina pectoris: Secondary | ICD-10-CM | POA: Diagnosis not present

## 2016-02-18 NOTE — Patient Instructions (Signed)
You are clear for surgery  I will see you in one year

## 2016-08-11 ENCOUNTER — Ambulatory Visit (INDEPENDENT_AMBULATORY_CARE_PROVIDER_SITE_OTHER): Payer: 59 | Admitting: Neurology

## 2016-08-11 ENCOUNTER — Encounter (INDEPENDENT_AMBULATORY_CARE_PROVIDER_SITE_OTHER): Payer: Self-pay

## 2016-08-11 ENCOUNTER — Encounter: Payer: Self-pay | Admitting: Neurology

## 2016-08-11 VITALS — BP 122/68 | HR 82 | Ht 67.0 in | Wt 185.5 lb

## 2016-08-11 DIAGNOSIS — J440 Chronic obstructive pulmonary disease with acute lower respiratory infection: Secondary | ICD-10-CM | POA: Diagnosis not present

## 2016-08-11 DIAGNOSIS — G4710 Hypersomnia, unspecified: Secondary | ICD-10-CM | POA: Diagnosis not present

## 2016-08-11 DIAGNOSIS — G4730 Sleep apnea, unspecified: Secondary | ICD-10-CM

## 2016-08-11 DIAGNOSIS — J4530 Mild persistent asthma, uncomplicated: Secondary | ICD-10-CM

## 2016-08-11 DIAGNOSIS — G4701 Insomnia due to medical condition: Secondary | ICD-10-CM | POA: Diagnosis not present

## 2016-08-11 DIAGNOSIS — R351 Nocturia: Secondary | ICD-10-CM

## 2016-08-11 DIAGNOSIS — J209 Acute bronchitis, unspecified: Secondary | ICD-10-CM | POA: Insufficient documentation

## 2016-08-11 DIAGNOSIS — J45909 Unspecified asthma, uncomplicated: Secondary | ICD-10-CM | POA: Insufficient documentation

## 2016-08-11 NOTE — Progress Notes (Signed)
SLEEP MEDICINE CLINIC   Provider:  Melvyn Novasarmen  , M D  Primary Care Physician:  Geoffry ParadiseAronson, Richard, MD   Referring Provider: Geoffry ParadiseAronson, Richard, MD   Chief Complaint  Patient presents with  . Snoring    He is here to have his snoring and excessive daytime somnolence further evaluated.    HPI:  Jason Munoz is a 61 y.o. male , seen here as in a referral/ revisit  from Dr. Jacky KindleAronson for sleep consultation. Jason Munoz reports tinnitus, fatigue, snoring, erectile dysfunction and a history of high cholesterol. He has also been treated for GERD. Hypertension has been treated with Toprol and Cozaar. He takes a baby aspirin daily. He smokes one pack per day drinks socially alcohol but does not use nonprescription drugs or caffeine. 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 His 12 hour shift will begin at 4 AM and an's at 4:30 PM, other days he will start at 8 AM to 8 PM. He still feels that he gets nighttime sleep and rest.  Sleep habits are as follows:  Usually the patient will come home after work to eat supper with his spouse, he may do some gardening or lawnmowing. And evening as usually spend watching TV. The patient had neck surgery, his wife hip surgery and there not participating in a regular exercise regimen at this time. Bedtime is usually around 10:30 during the week. The patient reports falling asleep rather promptly, he sleeps on his side, on one pillow. The bedroom is quiet, cool and dark with a ceiling fan. He has up to 3 bathroom breaks each night to go to the bathroom and he wakes up frequently for other reasons 2. He has hip pain, he wakes up and goes back to sleep but he wakes up frequently enough to sleep is fragmented. He rises at the morning at about 3 AM to go to his early shift. He feels that he gets his best sleep just before he has to rise at 3 AM. For this reason he will shift to the 8 to 8 shift only. Currently he rises at 6:50 AM and  off this way gets about 5-6 hours of sleep for sure. Most mornings he will be refreshed and restored when waking up. He does not wake with headaches, nausea dizziness chest pain or shortness of breath.  Sleep medical history and family sleep history:  One sister, father wears CPAP. No history of sleep walking or night terrors.   Social history: newlywed, lives with spouse, patient has grown children from a previous marriage.  Review of Systems: Out of a complete 14 system review, the patient complains of only the following symptoms, and all other reviewed systems are negative. Snoring,  Dysuria, insomnia, shift work.   Epworth score 10 , Fatigue severity score n/a   , depression score n/a    Social History   Social History  . Marital status: Married    Spouse name: N/A  . Number of children: 3  . Years of education: HS   Occupational History  .  [employer]   Social History Main Topics  . Smoking status: Current Every Day Smoker    Packs/day: 1.00    Types: Cigarettes    Last attempt to quit: 05/19/1996  . Smokeless tobacco: Never Used  . Alcohol use Yes     Comment: social  . Drug use: No  . Sexual activity: Yes    Birth control/ protection:  None   Other Topics Concern  . Not on file   Social History Narrative   Lives at home with his wife.   Right-handed.   No caffeine use.    Family History  Problem Relation Age of Onset  . COPD Father     Past Medical History:  Diagnosis Date  . Colitis   . Coronary artery disease 12/06   stent LAD in 2006  . GERD (gastroesophageal reflux disease)   . HTN (hypertension)   . Hypercholesteremia   . Snoring     Past Surgical History:  Procedure Laterality Date  . APPENDECTOMY    . bilateral ankle fractures    . CARPAL TUNNEL RELEASE    . ELBOW SURGERY Right   . EXTERNAL EAR SURGERY    . heart stint    . NECK SURGERY      Current Outpatient Prescriptions  Medication Sig Dispense Refill  . aspirin EC 81 MG  tablet Take 81 mg by mouth daily.    Marland Kitchen atorvastatin (LIPITOR) 20 MG tablet Take 20 mg by mouth daily.    Marland Kitchen esomeprazole (NEXIUM) 40 MG packet Take 40 mg by mouth daily before breakfast. 30 each 12  . metoprolol succinate (TOPROL-XL) 25 MG 24 hr tablet Take 25 mg by mouth daily.      . Multiple Vitamins-Minerals (MULTIVITAMIN PO) Take 1 tablet by mouth daily.    . nitroGLYCERIN (NITROSTAT) 0.4 MG SL tablet PLACE 1 TABLET (0.4 MG TOTAL) UNDER THE TONGUE EVERY 5 (FIVE) MINUTES AS NEEDED FOR CHEST PAIN. 25 tablet 0   No current facility-administered medications for this visit.     Allergies as of 08/11/2016  . (No Known Allergies)    Vitals: BP 122/68   Pulse 82   Ht 5\' 7"  (1.702 m)   Wt 185 lb 8 oz (84.1 kg)   BMI 29.05 kg/m  Last Weight:  Wt Readings from Last 1 Encounters:  08/11/16 185 lb 8 oz (84.1 kg)   ZOX:WRUE mass index is 29.05 kg/m.     Last Height:   Ht Readings from Last 1 Encounters:  08/11/16 5\' 7"  (1.702 m)    Physical exam:  General: The patient is awake, alert and appears not in acute distress. The patient is well groomed. Head: Normocephalic, atraumatic. Neck is supple. Mallampati 3,  neck circumference: 16. 5 . Nasal airflow patent , TMJ is evident . Retrognathia is seen.  Cardiovascular:  Regular rate and rhythm , without  murmurs or carotid bruit, and without distended neck veins. Respiratory: Lungs are clear to auscultation. Skin:  Without evidence of edema, or rash Trunk: BMI is 29. The patient's posture is erect   Neurologic exam : The patient is awake and alert, oriented to place and time.   Memory subjective described as intact.  Attention span & concentration ability appears normal.  Speech is fluent,  without  dysarthria, dysphonia or aphasia.  Mood and affect are appropriate. Cranial nerves: Pupils are equal and briskly reactive to light.  Funduscopic exam without evidence of pallor or edema. Extraocular movements  in vertical and horizontal  planes intact and without nystagmus. Visual fields by finger perimetry are intact. Hearing to finger rub intact. Facial sensation intact to fine touch. Facial motor strength is symmetric and tongue and uvula move midline. Shoulder shrug was symmetrical.  Motor exam: Normal tone, muscle bulk and symmetric strength in all extremities. Sensory:  Fine touch, pinprick and vibration were tested in all extremities. Proprioception tested in  the upper extremities was normal. Coordination: Rapid alternating movements in the fingers/hands was normal. Finger-to-nose maneuver  normal without evidence of ataxia, dysmetria or tremor. Gait and station: Patient walks without assistive device and is able unassisted to climb up to the exam table.  Strength within normal limits. Stance is stable and normal.   Turns with 3  Steps. Romberg testing is negative. Deep tendon reflexes: in the  upper and lower extremities are symmetric and intact.   I was able to review the referral papers for the patient, he had a sleep study prior at Dr. Kyra Searles pulmonary in [place] in 2009. At the time he was diagnosed with apnea "but not severely enough to justify CPAP use?"  Assessment:  After physical and neurologic examination, review of laboratory studies,  Personal review of imaging studies, reports of other /same  Imaging studies, results of polysomnography and / or neurophysiology testing and pre-existing records as far as provided in visit., my assessment is   1) Mr. Gorum reports snoring, witnessed apnea, has fragmented sleep, nocturia and a long time history of smoking. I will refer him for an attended sleep study is a split-night protocol. If his AHI is over 20 B will use CPAP. If he is hypoxemic for over 100 minutes we will also use CPAP, even as the AHI is only 10 or higher. I advised the patient that it will take 2-3 weeks to be scheduled for the sleep study and to receive an appointment. I'm looking forward to  follow-up with him after the split-night polysomnography.  The patient was advised of the nature of the diagnosed disorder , the treatment options and the  risks for general health and wellness arising from not treating the condition.   I spent more than 40 minutes of face to face time with the patient.  Greater than 50% of time was spent in counseling and coordination of care. We have discussed the diagnosis and differential and I answered the patient's questions.    Plan:  Treatment plan and additional workup :  SPLIT night attended sleep study.    Melvyn Novas, MD 08/11/2016, 3:18 PM  Certified in Neurology by ABPN Certified in Sleep Medicine by [facility] [address] [place], [place] [address]

## 2016-08-20 ENCOUNTER — Ambulatory Visit (INDEPENDENT_AMBULATORY_CARE_PROVIDER_SITE_OTHER): Payer: 59 | Admitting: Podiatry

## 2016-08-20 ENCOUNTER — Ambulatory Visit (INDEPENDENT_AMBULATORY_CARE_PROVIDER_SITE_OTHER): Payer: 59

## 2016-08-20 ENCOUNTER — Encounter: Payer: Self-pay | Admitting: Podiatry

## 2016-08-20 DIAGNOSIS — M79671 Pain in right foot: Secondary | ICD-10-CM | POA: Diagnosis not present

## 2016-08-20 DIAGNOSIS — M779 Enthesopathy, unspecified: Secondary | ICD-10-CM

## 2016-08-20 DIAGNOSIS — M722 Plantar fascial fibromatosis: Secondary | ICD-10-CM | POA: Diagnosis not present

## 2016-08-20 MED ORDER — TRIAMCINOLONE ACETONIDE 10 MG/ML IJ SUSP
10.0000 mg | Freq: Once | INTRAMUSCULAR | Status: AC
  Administered 2016-08-20: 10 mg

## 2016-08-20 NOTE — Patient Instructions (Signed)

## 2016-08-21 NOTE — Progress Notes (Signed)
Subjective:    Patient ID: Jason QuillSteven L Munoz, male   DOB: 61 y.o.   MRN: [id number]   HPI patient presents stating he's developed a lot of discomfort in the top of the right foot and the side and also still has some discomfort in this plantar fashion    ROS      Objective:  Physical Exam neurovascular status intact with patient having 2 separate problems one being inflammatory capsulitis right secondary to plantar fasciitis right which is the initial complaint     Assessment:    Capsulitis right with plantar fasciitis right present     Plan:    Injected the capsule right 3 mg Kenalog 5 mill grams Xylocaine sinus tarsi discussed plan her fasciitis continue with physical therapy and supported shoes along with stretching exercises

## 2016-09-10 ENCOUNTER — Ambulatory Visit: Payer: 59 | Admitting: Podiatry

## 2016-09-11 ENCOUNTER — Ambulatory Visit: Payer: 59

## 2016-11-20 ENCOUNTER — Ambulatory Visit (INDEPENDENT_AMBULATORY_CARE_PROVIDER_SITE_OTHER): Payer: 59 | Admitting: Neurology

## 2016-11-20 DIAGNOSIS — G4730 Sleep apnea, unspecified: Secondary | ICD-10-CM

## 2016-11-20 DIAGNOSIS — G4710 Hypersomnia, unspecified: Secondary | ICD-10-CM

## 2016-11-20 DIAGNOSIS — R351 Nocturia: Secondary | ICD-10-CM

## 2016-11-20 DIAGNOSIS — G4701 Insomnia due to medical condition: Secondary | ICD-10-CM

## 2016-11-23 NOTE — Addendum Note (Signed)
Addended by: Melvyn Novas on: 11/23/2016 06:30 PM   Modules accepted: Orders

## 2016-11-23 NOTE — Procedures (Signed)
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 SPLIT study was ordered. Special attention to hypoxemia.   The patient endorsed the Epworth Sleepiness Scale at 10 points, FSS n/a.   The patient's weight 185 pounds with a height of 67 (inches), resulting in a BMI of 29.1 kg/m2. The patient's neck circumference measured 16.5 inches.  CURRENT MEDICATIONS: Aspirin 81, Lipitor, Nexium, Toprol-XL, Multivitamin PO, Nitroglycerin   PROCEDURE:  This is a multichannel digital polysomnogram utilizing the Somnostar 11.2 system.  Electrodes and sensors were applied and monitored per AASM Specifications.   EEG, EOG, Chin and Limb EMG, were sampled at 200 Hz.  ECG, Snore and Nasal Pressure, Thermal Airflow, Respiratory Effort, CPAP Flow and Pressure, Oximetry was sampled at 50 Hz. Digital video and audio were recorded.      BASELINE STUDY: Lights Out was at 22:40 and Lights On at 05:14.  Total recording time (TRT) was 394.5 minutes, with a total sleep time (TST) of 302 minutes.   The patient's sleep latency was 41 minutes.  REM latency was 78.5 minutes.  The sleep efficiency was 76.6 %.     SLEEP ARCHITECTURE: WASO (Wake after sleep onset) was 50 minutes.  There were 7.5 minutes in Stage N1, 214.5 minutes Stage N2, 0 minutes Stage N3 and 80 minutes in Stage REM.  The percentage of Stage N1 was 2.5%, Stage N2 was 71.%, Stage N3 was 0% and Stage R (REM sleep) was 26.5%.   RESPIRATORY ANALYSIS:  There were a total of 40 respiratory events:  30 obstructive apneas, 8 central apneas and 0 mixed apneas with a total of 38 apneas and an apnea index (AI) of 7.5 /hour. There were 2 hypopneas with a hypopnea index of 0.4 /hour. The patient also had 7 respiratory event  related arousals (RERAs).     The total APNEA/HYPOPNEA INDEX (AHI) was 7.9/hour and the total RESPIRATORY DISTURBANCE INDEX was 9.3 /hour.  22 events occurred in REM sleep and 2 events in NREM. The REM AHI was 16.5 /hour, versus a non-REM AHI of 4.9. The patient spent 40 minutes of total sleep time in the supine position and 262 minutes in non-supine. The supine AHI was 1.5 versus a non-supine AHI of 9.0.  OXYGEN SATURATION & C02:  The Wake baseline 02 saturation was 92%, with the lowest being 85%. Time spent below 89% saturation equaled 49 minutes. Oxygen was finally supplemented.    PERIODIC LIMB MOVEMENTS:  The patient had a total of 0 Periodic Limb Movements.  The arousals were noted as: 29 were spontaneous, 0 were associated with PLMs, and 12 were associated with respiratory events.  Audio and video analysis did not show any abnormal or unusual movements, behaviors, phonations or vocalizations.  The patient took 2 bathroom breaks.  Loud Snoring was noted, causing RERAS.  Hypoxemia was severe and required oxygen 1 liter per nasal cannula. EKG ; sinus bradycardia.  Post-study, the patient indicated that sleep was worse than usual.    IMPRESSION:  1. Hypoxemia with mild Obstructive Sleep Apnea (OSA), AHI not high enough for SPLIT night protocol.  2. Loud Snoring 3. REM sleep rebound under oxygen supplementation.   RECOMMENDATIONS:  1. Recommend CPAP return to  identify an optimal treatment pressure.  2. The patient qualified for oxygen supplement in our sleep study, CPAP use may change his oxygen needs. .  3. Further information regarding OSA may be obtained from [employer] (www.sleepfoundation.org) or American Sleep Apnea Association (www.sleepapnea.org). 4. A follow up appointment will be scheduled in the Sleep Clinic at [facility]. The referring provider will be notified of the results.      I certify that I have reviewed the entire raw data recording  prior to the issuance of this report in accordance with the Standards of Accreditation of the American Academy of Sleep Medicine (AASM)      Melvyn Novas, MD    11-23-2016  Diplomat, American Board of Psychiatry and Neurology  Diplomat, American Board of Sleep Medicine Medical Director, [facility] [employer]

## 2016-11-24 ENCOUNTER — Telehealth: Payer: Self-pay | Admitting: Neurology

## 2016-11-24 NOTE — Telephone Encounter (Signed)
Called and spoke with the patient about the sleep study results. I explained that Dr Vickey Huger stated that he has mild sleep apena and that oxygen level dropped. She is wanting to treat with CPAP but needs the patient to return for CPAP titration. Pt verbalized understanding and stated he wasn't sure when he would be able to come back in for this due to his wife having surgery and he wanted to get her well first. I informed him that the sleep lab would contact him to get that set up. Pt verbalized understanding and had no further questions.

## 2016-11-24 NOTE — Telephone Encounter (Signed)
-----   Message from Melvyn Novas, MD sent at 11/23/2016  6:30 PM EDT ----- I recommned to use CPAP at low pressures for this patient with hypoxemia and mild sleep apnea but loud snoring. Oxygen was supplemented after 2 hours during this sleep study and the increase in 02 allowed for REM sleep.  CPAP could further improve the sleep apnea component which was too mild to qualify for SPLIT night protocol. CD

## 2016-12-25 ENCOUNTER — Ambulatory Visit (INDEPENDENT_AMBULATORY_CARE_PROVIDER_SITE_OTHER): Payer: 59 | Admitting: Neurology

## 2016-12-25 DIAGNOSIS — G4710 Hypersomnia, unspecified: Secondary | ICD-10-CM

## 2016-12-25 DIAGNOSIS — G4733 Obstructive sleep apnea (adult) (pediatric): Secondary | ICD-10-CM | POA: Diagnosis not present

## 2016-12-25 DIAGNOSIS — R351 Nocturia: Secondary | ICD-10-CM

## 2016-12-25 DIAGNOSIS — G4701 Insomnia due to medical condition: Secondary | ICD-10-CM

## 2016-12-25 DIAGNOSIS — G4730 Sleep apnea, unspecified: Secondary | ICD-10-CM

## 2017-01-01 ENCOUNTER — Telehealth: Payer: Self-pay | Admitting: Neurology

## 2017-01-01 ENCOUNTER — Other Ambulatory Visit: Payer: Self-pay | Admitting: Neurology

## 2017-01-01 DIAGNOSIS — J209 Acute bronchitis, unspecified: Secondary | ICD-10-CM

## 2017-01-01 DIAGNOSIS — I2510 Atherosclerotic heart disease of native coronary artery without angina pectoris: Secondary | ICD-10-CM

## 2017-01-01 DIAGNOSIS — J449 Chronic obstructive pulmonary disease, unspecified: Secondary | ICD-10-CM

## 2017-01-01 DIAGNOSIS — J440 Chronic obstructive pulmonary disease with acute lower respiratory infection: Secondary | ICD-10-CM

## 2017-01-01 DIAGNOSIS — G4734 Idiopathic sleep related nonobstructive alveolar hypoventilation: Secondary | ICD-10-CM

## 2017-01-01 DIAGNOSIS — J4540 Moderate persistent asthma, uncomplicated: Secondary | ICD-10-CM

## 2017-01-01 DIAGNOSIS — G4733 Obstructive sleep apnea (adult) (pediatric): Secondary | ICD-10-CM

## 2017-01-01 DIAGNOSIS — R351 Nocturia: Secondary | ICD-10-CM

## 2017-01-01 NOTE — Procedures (Signed)
PATIENT'S NAME:  Jason Munoz, Jason Munoz:      01-31-1956      MR#:    [id number]     DATE OF RECORDING: 12/25/2016 REFERRING M.D.:  Geoffry Paradiseichard Aronson, MD Study Performed:   CPAP  Titration HISTORY:  Mr. Jason Munoz underwent an attended sleep study on 11/20/2016 at [facility] . This study revealed mild apnea with hypoxemia. The total AHI was low at 7.9/hour and the total RDI was 9.3 /hour. The REM AHI was 16.5 /hour, versus a non-REM AHI of 4.9. The supine AHI was 1.5 versus a non-supine AHI of 9.0. 02 saturation was lowest at 85% before oxygen was supplemented.Total Time spent below 89% saturation equaled 49 minutes. The patient endorsed the Epworth Sleepiness Scale at 10 points.    The patient's weight 185 pounds with a height of 67 (inches), resulting in a BMI of 29.1 kg/m2. The patient's neck circumference measured 16.5 inches.  CURRENT MEDICATIONS: Aspirin 81, Lipitor, Nexium, Toprol-XL, Multivitamin PO, Nitroglycerin PROCEDURE:  This is a multichannel digital polysomnogram utilizing the SomnoStar 11.2 system.  Electrodes and sensors were applied and monitored per AASM Specifications.   EEG, EOG, Chin and Limb EMG, were sampled at 200 Hz.  ECG, Snore and Nasal Pressure, Thermal Airflow, Respiratory Effort, CPAP Flow and Pressure, Oximetry was sampled at 50 Hz. Digital video and audio were recorded.      CPAP was initiated at 5 cmH20 with heated humidity per AASM split night standards and pressure was advanced to 10cmH20 because of hypopneas, apneas and desaturations.  At a PAP pressure of 10 cmH20 with EPR of 2 cm, there was a reduction of the AHI to 0.0 with improvement of the above symptoms of obstructive sleep apnea.    Lights Out was at 22:38 and Lights On at 04:59. Total recording time (TRT) was 382 minutes, with a total sleep time (TST) of 316.5 minutes. The patient's sleep latency was 20 minutes with 0.5 minutes of wake time after sleep onset. REM latency was 210 minutes.  The sleep efficiency  was 82.9 %.    SLEEP ARCHITECTURE: WASO (Wake after sleep onset) was 49 minutes.  There were 16 minutes in Stage N1, 192.5 minutes Stage N2, 38 minutes Stage N3 and 70 minutes in Stage REM.  The percentage of Stage N1 was 5.1%, Stage N2 was 60.8%, Stage N3 was 12.% and Stage R (REM sleep) was 22.1%.   RESPIRATORY ANALYSIS:  There was a total of 10 respiratory events: 1 obstructive apnea, 9 hypopneas with 0 respiratory event related arousals (RERAs).    The total APNEA/HYPOPNEA INDEX  (AHI) was 1.9 /hour and the total RESPIRATORY DISTURBANCE INDEX was 1.9/hour  7 events occurred in REM sleep and 3 events in NREM. The REM AHI was 6.0 /hour versus a non-REM AHI of 0.7 /hour.  The patient spent 128 minutes of total sleep time in the supine position and 189 minutes in non-supine. The supine AHI was 3.8, versus a non-supine AHI of 0.6.  OXYGEN SATURATION & C02:  The baseline 02 saturation was 94%, with the lowest being 87%. Time spent below 89% saturation equaled 8 minutes.  PERIODIC LIMB MOVEMENTS:    The patient had a total of 0 Periodic Limb Movements. The arousals were noted as: 25 were spontaneous, 0 were associated with PLMs, and 4 were associated with respiratory events. Audio and video analysis did not show any abnormal or unusual movements, behaviors, phonations or vocalizations.    The patient took three bathroom breaks. Snoring was  noted. EKG was in keeping with normal sinus rhythm (NSR). Post-study, the patient indicated that sleep was worse than usual.   CPAP was titrated without oxygen addition. The patient was fitted with a medium sized Simplus FFM by [employer].  DIAGNOSIS 1. 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  2. The patient was fitted with a medium sized Simplus FFM by [employer].    PLANS/RECOMMENDATIONS: CPAP with auto-titration  capacity. Set at 10 cm water pressure with 2 cm H20 EPR , Simplus FFM in medium and heated humidity.   A follow up appointment will be scheduled in the Sleep Clinic [facility].   Please call [phone] with any questions.      I certify that I have reviewed the entire raw data recording prior to the issuance of this report in accordance with the Standards of Accreditation of the American Academy of Sleep Medicine (AASM)      Melvyn Novasarmen , M.D.  01-01-2017  Diplomat, American Board of Psychiatry and Neurology  Diplomat, American Board of Sleep Medicine Medical Director, [facility]

## 2017-01-01 NOTE — Telephone Encounter (Signed)
PATIENT'S NAME:  Jason Munoz, [name] DOB:      07-07-1955      MR#:    [id number]     DATE OF RECORDING: 12/25/2016 REFERRING M.D.:  Geoffry Paradiseichard Aronson, MD Study Performed:   CPAP  Titration HISTORY:  [name] underwent an attended sleep study on 11/20/2016 at [facility]. This study revealed mild apnea with hypoxemia. The total AHI was low at 7.9/hour and the total RDI was 9.3 /hour. The REM AHI was 16.5 /hour, versus a non-REM AHI of 4.9. The supine AHI was 1.5 versus a non-supine AHI of 9.0. 02 saturation was lowest at 85% before oxygen was supplemented. Total Time spent below 89% saturation equaled 49 minutes. The patient endorsed the Epworth Sleepiness Scale at 10 points.  The patient's weight was 185 pounds with a height of 67 (inches), resulting in a BMI of 29.1 kg/m2.The patient's neck circumference measured 16.5 inches.  CURRENT MEDICATIONS: Aspirin 81, lipitor,Nexium, Toprol-XL, Multivitamin PO, NitroGlycerin PROCEDURE:  This is a multichannel digital polysomnogram utilizing the SomnoStar 11.2 system.  Electrodes and sensors were applied and monitored per AASM Specifications.   EEG, EOG, Chin and Limb EMG, were sampled at 200 Hz.  ECG, Snore and Nasal Pressure, Thermal Airflow, Respiratory Effort, CPAP Flow and Pressure, Oximetry was sampled at 50 Hz. Digital video and audio were recorded.      CPAP was initiated at 5 cmH20 with heated humidity per AASM split night standards and pressure was advanced to 10cmH20 because of hypopneas, apneas and desaturations.  At a PAP pressure of 10 cmH20 with EPR of 2 cm, there was a reduction of the AHI to 0.0 with improvement of the above symptoms of obstructive sleep apnea.    Lights Out was at 22:38 and Lights On at 04:59. Total recording time (TRT) was 382 minutes, with a total sleep time (TST) of 316.5 minutes. The patient's sleep latency was 20 minutes with 0.5 minutes of wake time after sleep onset. REM latency was 210 minutes.  The sleep  efficiency was 82.9 %.    SLEEP ARCHITECTURE: WASO (Wake after sleep onset) was 49 minutes.  There were 16 minutes in Stage N1, 192.5 minutes Stage N2, 38 minutes Stage N3 and 70 minutes in Stage REM.  The percentage of Stage N1 was 5.1%, Stage N2 was 60.8%, Stage N3 was 12.% and Stage R (REM sleep) was 22.1%.   RESPIRATORY ANALYSIS:  There was a total of 10 respiratory events: 1 obstructive apnea, 9 hypopneas with 0 respiratory event related arousals (RERAs).    The total APNEA/HYPOPNEA INDEX  (AHI) was 1.9 /hour and the total RESPIRATORY DISTURBANCE INDEX was 1.9/hour  7 events occurred in REM sleep and 3 events in NREM. The REM AHI was 6.0 /hour versus a non-REM AHI of 0.7 /hour.  The patient spent 128 minutes of total sleep time in the supine position and 189 minutes in non-supine. The supine AHI was 3.8, versus a non-supine AHI of 0.6.  OXYGEN SATURATION & C02:  The baseline 02 saturation was 94%, with the lowest being 87%. Time spent below 89% saturation equaled 8 minutes.  PERIODIC LIMB MOVEMENTS:    The patient had a total of 0 Periodic Limb Movements. The arousals were noted as: 25 were spontaneous, 0 were associated with PLMs, and 4 were associated with respiratory events. Audio and video analysis did not show any abnormal or unusual movements, behaviors, phonations or vocalizations.    The patient took three bathroom breaks. Snoring was noted.  EKG was in keeping with normal sinus rhythm (NSR). Post-study, the patient indicated that sleep was worse than usual.   CPAP was titrated without oxygen addition. The patient was fitted with a Medium Simplus FFM by [employer].  DIAGNOSIS 1. 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  2. The patient was fitted with a Medium Simplus FFM by [employer].    PLANS/RECOMMENDATIONS: CPAP with auto-titration  capacity. Set at 10 cm water pressure with 2 cm H20 EPR , Simplus FFM in medium and heated humidity.   A follow up appointment [facility].   Please call [phone] with any questions.      I certify that I have reviewed the entire raw data recording prior to the issuance of this report in accordance with the Standards of Accreditation of the American Academy of Sleep Medicine (AASM)      Melvyn Novasarmen , M.D.  01-01-2017  Diplomat, American Board of Psychiatry and Neurology  Diplomat, American Board of Sleep Medicine Medical Director, [facility]

## 2017-01-05 ENCOUNTER — Telehealth: Payer: Self-pay | Admitting: Neurology

## 2017-01-05 NOTE — Telephone Encounter (Signed)
-----   Message from Melvyn Novasarmen Dohmeier, MD sent at 01/01/2017  2:34 PM EST ----- This report encompasses bother , the baseline and the CPAP study. I ordered CPAP for the patient in a phone note as the result note did not pop up. CD

## 2017-01-05 NOTE — Telephone Encounter (Signed)
I called pt. I advised pt that Dr. Vickey Hugerohmeier reviewed their sleep study results and found that pt has mild OSA with low oxygenation. Dr. Vickey Hugerohmeier recommends that pt starts CPAP. I reviewed PAP compliance expectations with the pt. Pt is agreeable to starting a CPAP. I advised pt that an order will be sent to a DME, [facility], and [facility] will call the pt within about one week after they file with the pt's insurance. [facility] will show the pt how to use the machine, fit for masks, and troubleshoot the CPAP if needed. A follow up appt was made for insurance purposes with Darrol Angelarolyn Martin, NP on Feb 11,2019 at 9:15 am. PT verbalized understanding to arrive 15 minutes early and bring their CPAP. A letter with all of this information in it will be mailed to the pt as a reminder. I verified with the pt that the address we have on file is correct. Pt verbalized understanding of results. Pt had no questions at this time but was encouraged to call back if questions arise.

## 2017-03-29 ENCOUNTER — Ambulatory Visit: Payer: Self-pay | Admitting: Nurse Practitioner

## 2017-04-22 ENCOUNTER — Encounter: Payer: Self-pay | Admitting: Nurse Practitioner

## 2017-04-22 NOTE — Progress Notes (Signed)
[facility]  PATIENT: Jason Munoz DOB: 04/14/1955   REASON FOR VISIT: Sleep apnea here for initial CPAP compliance HISTORY FROM: Patient   HISTORY OF PRESENT ILLNESS:UPDATE 3/8/2019CM Mr. Jason Munoz, 62 year old male returns for follow-up for CPAP compliance.  Sleep study done 11/20/2016 shows hypoxemia with mild obstructive sleep apnea and CPAP was recommended.  He has been on his machine for approximately.  He does complain with his mask not fitting properly and he was made aware to follow-up with the equipment company regarding this.  Compliance data dated 03/24/2017-04/22/2017 6 compliance greater than 4 hours at 90%.  Average usage 7 hours 37 minutes.  Set pressure 10 cm.  EPR level 3 AHI 0.6 ESS 7.  He returns for reevaluation   6/26/18CD Jason Munoz is a 62 y.o. male , seen here as in a referral/ revisit  from Dr. Jacky KindleAronson for sleep consultation. Mr. Jason Munoz reports tinnitus, fatigue, snoring, erectile dysfunction and a history of high cholesterol. He has also been treated for GERD. Hypertension has been treated with Toprol and Cozaar. He takes a baby aspirin daily. He smokes one pack per day drinks socially alcohol but does not use nonprescription drugs or caffeine. 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 His 12 hour shift will begin at 4 AM and an's at 4:30 PM, other days he will start at 8 AM to 8 PM. He still feels that he gets nighttime sleep and rest.  Sleep habits are as follows:  Usually the patient will come home after work to eat supper with his spouse, he may do some gardening or lawnmowing. And evening as usually spend watching TV. The patient had neck surgery, his wife hip surgery and there not participating in a regular exercise regimen at this time. Bedtime is usually around 10:30 during the week. The patient reports falling asleep rather promptly, he sleeps on his side, on one pillow. The bedroom is quiet, cool and  dark with a ceiling fan. He has up to 3 bathroom breaks each night to go to the bathroom and he wakes up frequently for other reasons 2. He has hip pain, he wakes up and goes back to sleep but he wakes up frequently enough to sleep is fragmented. He rises at the morning at about 3 AM to go to his early shift. He feels that he gets his best sleep just before he has to rise at 3 AM. For this reason he will shift to the 8 to 8 shift only. Currently he rises at 6:50 AM and off this way gets about 5-6 hours of sleep for sure. Most mornings he will be refreshed and restored when waking up. He does not wake with headaches, nausea dizziness chest pain or shortness of breath.  REVIEW OF SYSTEMS: Full 14 system review of systems performed and notable only for those listed, all others are neg:  Constitutional: neg  Cardiovascular: neg Ear/Nose/Throat: neg  Skin: neg Eyes: neg Respiratory: neg Gastroitestinal: neg  Hematology/Lymphatic: Easy bruising   Endocrine: neg Musculoskeletal:neg Allergy/Immunology: neg Neurological: neg Psychiatric: neg Sleep : Obstructive sleep apnea with CPAP   ALLERGIES: No Known Allergies  HOME MEDICATIONS: Outpatient Medications Prior to Visit  Medication Sig Dispense Refill  . aspirin EC 81 MG tablet Take 81 mg by mouth daily.    Marland Kitchen. atorvastatin (LIPITOR) 20 MG tablet Take 20 mg by mouth daily.    Marland Kitchen. esomeprazole (NEXIUM) 40 MG packet Take 40 mg  by mouth daily before breakfast. 30 each 12  . metoprolol succinate (TOPROL-XL) 25 MG 24 hr tablet Take 25 mg by mouth daily.      . Multiple Vitamins-Minerals (MULTIVITAMIN PO) Take 1 tablet by mouth daily.    . nitroGLYCERIN (NITROSTAT) 0.4 MG SL tablet PLACE 1 TABLET (0.4 MG TOTAL) UNDER THE TONGUE EVERY 5 (FIVE) MINUTES AS NEEDED FOR CHEST PAIN. 25 tablet 0   No facility-administered medications prior to visit.     PAST MEDICAL HISTORY: Past Medical History:  Diagnosis Date  . Colitis   . Coronary artery  disease 12/06   stent LAD in 2006  . GERD (gastroesophageal reflux disease)   . HTN (hypertension)   . Hypercholesteremia   . Snoring     PAST SURGICAL HISTORY: Past Surgical History:  Procedure Laterality Date  . APPENDECTOMY    . bilateral ankle fractures    . CARPAL TUNNEL RELEASE    . ELBOW SURGERY Right   . EXTERNAL EAR SURGERY    . heart stint    . NECK SURGERY      FAMILY HISTORY: Family History  Problem Relation Age of Onset  . COPD Father     SOCIAL HISTORY: Social History   Socioeconomic History  . Marital status: Married    Spouse name: Not on file  . Number of children: 3  . Years of education: HS  . Highest education level: Not on file  Social Needs  . Financial resource strain: Not on file  . Food insecurity - worry: Not on file  . Food insecurity - inability: Not on file  . Transportation needs - medical: Not on file  . Transportation needs - non-medical: Not on file  Occupational History    Employer: [employer]  Tobacco Use  . Smoking status: Current Every Day Smoker    Packs/day: 1.00    Types: Cigarettes    Last attempt to quit: 05/19/1996    Years since quitting: 20.9  . Smokeless tobacco: Never Used  Substance and Sexual Activity  . Alcohol use: Yes    Comment: social  . Drug use: No  . Sexual activity: Yes    Birth control/protection: None  Other Topics Concern  . Not on file  Social History Narrative   Lives at home with his wife.   Right-handed.   No caffeine use.     PHYSICAL EXAM  Vitals:   04/23/17 0912  BP: (!) 146/103  Pulse: 81  Weight: 192 lb (87.1 kg)  Height: 5\' 7"  (1.702 m)   Body mass index is 30.07 kg/m.  Generalized: Well developed, in no acute distress  Head: normocephalic and atraumatic,. Oropharynx benign  Neck: Supple,   Musculoskeletal: No deformity   Neurological examination   Mentation: Alert oriented to time, place, history taking. Attention span and concentration appropriate. Recent  and remote memory intact.  Follows all commands speech and language fluent.   Cranial nerve II-XII: Pupils were equal round reactive to light extraocular movements were full, visual field were full on confrontational test. Facial sensation and strength were normal. hearing was intact to finger rubbing bilaterally. Uvula tongue midline. head turning and shoulder shrug were normal and symmetric.Tongue protrusion into cheek strength was normal. Motor: normal bulk and tone, full strength in the BUE, BLE, Sensory: normal and symmetric to light touch,  Coordination: finger-nose-finger, heel-to-shin bilaterally, no dysmetria Reflexes: Symmetric upper and lower plantar responses were flexor bilaterally. Gait and Station: Rising up from seated position without assistance,  normal stance,  moderate stride, good arm swing, smooth turning, able to perform tiptoe, and heel walking without difficulty. Tandem gait is steady  DIAGNOSTIC DATA (LABS, IMAGING, TESTING) - I reviewed patient records, labs, notes, testing and imaging myself where available.  Lab Results  Component Value Date   WBC 6.8 08/23/2013   HGB 14.6 08/23/2013   HCT 43.3 08/23/2013   MCV 94.5 08/23/2013   PLT 257 08/23/2013      Component Value Date/Time   NA 133 (L) 11/10/2015 1715   K 4.2 11/10/2015 1715   CL 98 (L) 11/10/2015 1715   CO2 26 11/10/2015 1715   GLUCOSE 87 11/10/2015 1715   BUN 10 11/10/2015 1715   CREATININE 1.33 (H) 11/10/2015 1715   CALCIUM 9.2 11/10/2015 1715   PROT 6.7 08/23/2013 0348   ALBUMIN 3.5 08/23/2013 0348   AST 37 08/23/2013 0348   ALT 51 08/23/2013 0348   ALKPHOS 91 08/23/2013 0348   BILITOT 0.4 08/23/2013 0348   GFRNONAA 57 (L) 11/10/2015 1715   GFRAA >60 11/10/2015 1715    ASSESSMENT AND PLAN  62 y.o. year old male  has a past medical history of Colitis, Coronary artery disease (12/06), GERD (gastroesophageal reflux disease), HTN (hypertension), Hypercholesteremia, and Snoring.  Recently  diagnosed with obstructive sleep apnea new on CPAP.Compliance data dated 03/24/2017-04/22/2017 6 compliance greater than 4 hours at 90%.  Average usage 7 hours 37 minutes.  Set pressure 10 cm.  EPR level 3 AHI 0.6 ESS 7.  CPAP compliance 90% Continue same settings Get mask refitted [id number] for equipment company F/U 6 months for repeat compliance Nilda Riggs, [facility], [facility], APRN  [facility] [address] [place], [place] [address] [phone]

## 2017-04-23 ENCOUNTER — Encounter: Payer: Self-pay | Admitting: Nurse Practitioner

## 2017-04-23 ENCOUNTER — Ambulatory Visit: Payer: 59 | Admitting: Nurse Practitioner

## 2017-04-23 VITALS — BP 146/103 | HR 81 | Ht 67.0 in | Wt 192.0 lb

## 2017-04-23 DIAGNOSIS — G4730 Sleep apnea, unspecified: Secondary | ICD-10-CM | POA: Diagnosis not present

## 2017-04-23 DIAGNOSIS — Z9989 Dependence on other enabling machines and devices: Secondary | ICD-10-CM | POA: Diagnosis not present

## 2017-04-23 DIAGNOSIS — G4733 Obstructive sleep apnea (adult) (pediatric): Secondary | ICD-10-CM | POA: Diagnosis not present

## 2017-04-23 DIAGNOSIS — G4710 Hypersomnia, unspecified: Secondary | ICD-10-CM

## 2017-04-23 NOTE — Progress Notes (Signed)
I agree with the assessment and plan as directed by NP .The patient is known to me .   ,, MD  

## 2017-04-23 NOTE — Patient Instructions (Signed)
CPAP compliance 90% Continue same settings Get mask refitted [id number] F/U 6 months

## 2017-04-29 ENCOUNTER — Ambulatory Visit: Payer: 59 | Admitting: Podiatry

## 2017-05-19 ENCOUNTER — Other Ambulatory Visit: Payer: Self-pay | Admitting: Podiatry

## 2017-05-19 ENCOUNTER — Ambulatory Visit (INDEPENDENT_AMBULATORY_CARE_PROVIDER_SITE_OTHER): Payer: 59

## 2017-05-19 ENCOUNTER — Encounter: Payer: Self-pay | Admitting: Podiatry

## 2017-05-19 ENCOUNTER — Ambulatory Visit: Payer: 59 | Admitting: Podiatry

## 2017-05-19 DIAGNOSIS — M779 Enthesopathy, unspecified: Secondary | ICD-10-CM

## 2017-05-19 DIAGNOSIS — M7660 Achilles tendinitis, unspecified leg: Secondary | ICD-10-CM | POA: Diagnosis not present

## 2017-05-19 DIAGNOSIS — M7751 Other enthesopathy of right foot: Secondary | ICD-10-CM | POA: Diagnosis not present

## 2017-05-19 MED ORDER — PREDNISONE 10 MG PO TABS: ORAL_TABLET | ORAL | 0 refills | Status: DC

## 2017-05-19 MED ORDER — TRIAMCINOLONE ACETONIDE 10 MG/ML IJ SUSP
10.0000 mg | Freq: Once | INTRAMUSCULAR | Status: AC
  Administered 2017-05-19: 10 mg

## 2017-05-20 NOTE — Progress Notes (Signed)
Subjective:   Patient ID: Jason QuillSteven L Munoz, male   DOB: 62 y.o.   MRN: [id number]   HPI Patient presents stating that the Achilles tendon has been hurting him in the back and is been more the left than the right.  States is been ongoing with both and that he has trouble walking at the current time and does not remember specific injury   ROS      Objective:  Physical Exam  Neurovascular status intact with exquisite discomfort in the posterior aspect of the heel left over right with inflammation fluid and mild equinus condition noted.  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  Patient wants injection and today I did a careful injection of the lateral side of the left Achilles being careful not to put it directly into the Achilles tendon 3 mg dexamethasone Kenalog 5 mg Xylocaine and then applied air fracture Mandler to completely immobilize with instructions on wearing this for the next 3 weeks.  Reappoint at that time to reevaluate  X-rays indicate large spurring of the posterior aspect of the heel left over right

## 2017-05-27 ENCOUNTER — Ambulatory Visit: Payer: 59 | Admitting: Podiatry

## 2017-06-16 ENCOUNTER — Ambulatory Visit: Payer: 59 | Admitting: Podiatry

## 2017-06-24 ENCOUNTER — Encounter: Payer: Self-pay | Admitting: Nurse Practitioner

## 2017-06-25 DIAGNOSIS — M7061 Trochanteric bursitis, right hip: Secondary | ICD-10-CM | POA: Insufficient documentation

## 2017-07-05 ENCOUNTER — Encounter (INDEPENDENT_AMBULATORY_CARE_PROVIDER_SITE_OTHER): Payer: Self-pay

## 2017-07-05 ENCOUNTER — Ambulatory Visit: Payer: 59 | Admitting: Internal Medicine

## 2017-07-05 ENCOUNTER — Encounter: Payer: Self-pay | Admitting: Internal Medicine

## 2017-07-05 VITALS — BP 118/88 | HR 76 | Ht 67.0 in | Wt 182.5 lb

## 2017-07-05 DIAGNOSIS — M545 Low back pain, unspecified: Secondary | ICD-10-CM

## 2017-07-05 DIAGNOSIS — M7918 Myalgia, other site: Secondary | ICD-10-CM

## 2017-07-05 DIAGNOSIS — K429 Umbilical hernia without obstruction or gangrene: Secondary | ICD-10-CM

## 2017-07-05 DIAGNOSIS — R102 Pelvic and perineal pain: Secondary | ICD-10-CM | POA: Diagnosis not present

## 2017-07-05 DIAGNOSIS — R1024 Suprapubic pain: Secondary | ICD-10-CM

## 2017-07-05 NOTE — Patient Instructions (Signed)
Please follow up with your pcp

## 2017-07-05 NOTE — Progress Notes (Signed)
HISTORY OF PRESENT ILLNESS:  Jason Munoz is a 62 y.o. male with coronary artery disease, hypertension, hyperlipidemia, and chronic tobacco abuse is sent today by his primary care provider (Dr. Jacky Kindle) nurse practitioner Ms. Lee regarding new onset suprapubic pain. Patient does have a history of chronic GERD which is adequately treated with Nexium 40 mg daily. The patient has been seen previously in this office for surveillance colonoscopy. Index exam 2005 with hyperplastic polyps. Most recent examination 2011 was normal. Follow-up in 10 years recommended. Patient tells me that about 3 weeks ago he developed suprapubic pain with radiation of pain into the left leg and right leg. He described the discomfort as being so severe that he could not walk. He does have a history of chronic back pain for which he undergoes injection therapy periodically. Patient was seen at urgent care and told that he had a left inguinal hernia. Contrast-enhanced CT scan of the abdomen and pelvis was performed 06/15/2017. Examination was negative for acute abnormalities. No inguinal hernia. He did have a small fat-containing umbilical hernia and a small abdominal aortic aneurysm. Spondylosis of the lower spine noted. Patient had a urinalysis on 06/22/2017. This was unremarkable. He tells me that he underwent a shot in his right hip for hip pain. This subsequently improved his other discomfort until this morning when he is noticed some recurrence. He did see a general surgeon who could not confirm inguinal hernia. Patient's GI review of systems is really quite unremarkable. No abdominal pain, change in bowel habits, weight loss, or GI bleeding. His discomfort is most prominent when standing or walking. It resolves with lying in bed. No fevers.  REVIEW OF SYSTEMS:  All non-GI ROS negative unless otherwise stated in the history of present illness except for back pain  Past Medical History:  Diagnosis Date  . Colitis   . Coronary  artery disease 12/06   stent LAD in 2006  . GERD (gastroesophageal reflux disease)   . HTN (hypertension)   . Hypercholesteremia   . Snoring     Past Surgical History:  Procedure Laterality Date  . APPENDECTOMY    . bilateral ankle fractures    . CARPAL TUNNEL RELEASE    . ELBOW SURGERY Right   . EXTERNAL EAR SURGERY    . heart stint    . NECK SURGERY      Social History Jason Munoz  reports that he has been smoking cigarettes.  He has been smoking about 1.00 pack per day. He has never used smokeless tobacco. He reports that he drinks alcohol. He reports that he does not use drugs.  family history includes COPD in his father.  No Known Allergies     PHYSICAL EXAMINATION: Vital signs: BP 118/88   Pulse 76   Ht  (1.702 m)   Wt 182 lb 8 oz (82.8 kg)   BMI 28.58 kg/m   Constitutional: generally well-appearing, no acute distress Tobacco smell obvious Psychiatric: alert and oriented x3, cooperative Eyes: extraocular movements intact, anicteric, conjunctiva pink Mouth: oral pharynx moist, no lesions Neck: supple no lymphadenopathy Cardiovascular: heart regular rate and rhythm, no murmur Lungs: clear to auscultation bilaterally Abdomen: soft, nontender, nondistended, no obvious ascites, no peritoneal signs, normal bowel sounds, no organomegaly Rectal:omitted Extremities: no clubbing, cyanosis, or lower extremity edema bilaterally Skin: no lesions on visible extremities Neuro: No focal deficits. Normal DTRs. Cranial nerves intact  ASSESSMENT:  #1. Suprapubic pain with radiation to his lower extremities in a gentleman with  chronic back pain. Chronic hip pain. Discomfort is positional. This does not sound GI. No abnormalities on physical exam except for incidental small umbilical hernia. CT essentially negative #2. Surveillance colonoscopy up-to-date. Last examination 2011. Negative #3. GERD. Asymptomatic on PPI  PLAN:  #1. Suggested a short course of NSAIDs. He  mentioned that he had been told to avoid these for renal reasons. I asked him to consult with his PCP regarding possibility of NSAIDs or some other agent for his pain #2. The patient did tell me that he is planning on receiving an injection in his low back for ongoing low back pain. This may be beneficial regarding his suprapubic pain with radiation into the lower extremities bilaterally as this may be radicular type pain. I will defer this to his neurosurgeon, Dr. Danielle Dess #3. No further GI evaluation indicated at this time. #4. Surveillance colonoscopy around 2021. Interval follow-up as needed  A copy of this consultation note has been sent to Dr. Jacky Kindle

## 2017-07-14 ENCOUNTER — Ambulatory Visit: Payer: 59 | Admitting: Nurse Practitioner

## 2017-10-26 ENCOUNTER — Ambulatory Visit: Payer: 59 | Admitting: Nurse Practitioner

## 2017-11-11 ENCOUNTER — Ambulatory Visit: Payer: 59 | Admitting: Cardiology

## 2017-12-13 ENCOUNTER — Encounter: Payer: Self-pay | Admitting: Nurse Practitioner

## 2017-12-14 NOTE — Progress Notes (Signed)
[facility]  PATIENT: Jason Munoz DOB: 12-18-1955   REASON FOR VISIT: Sleep apnea here for  CPAP compliance HISTORY FROM: Patient   HISTORY OF PRESENT ILLNESS: Update 10/30/2019CM Jason Munoz, 62 year old male returns for follow-up with history of obstructive sleep apnea here for CPAP compliance.  He claims he had trouble getting his supplies recently so did not use his CPAP.  Compliance data dated 11/14/2017-12/13/2017 shows compliance 4 hours at 77% for 23 days.  Average usage 5 hours 25 minutes.  Set pressure 10 cm.  EPR level 3 leak 95th percentile 20.  AHI 7.4.  ESS 8 FSS 18.  He returns for reevaluation     UPDATE 3/8/2019CM Jason Munoz, 62 year old male returns for follow-up for CPAP compliance.  Sleep study done 11/20/2016 shows hypoxemia with mild obstructive sleep apnea and CPAP was recommended.  He has been on his machine for approximately.  He does complain with his mask not fitting properly and he was made aware to follow-up with the equipment company regarding this.  Compliance data dated 03/24/2017-04/22/2017 6 compliance greater than 4 hours at 90%.  Average usage 7 hours 37 minutes.  Set pressure 10 cm.  EPR level 3 AHI 0.6 ESS 7.  He returns for reevaluation   6/26/18CD Jason Munoz is a 62 y.o. male , seen here as in a referral/ revisit  from Dr. Jacky Kindle for sleep consultation. Jason Munoz reports tinnitus, fatigue, snoring, erectile dysfunction and a history of high cholesterol. He has also been treated for GERD. Hypertension has been treated with Toprol and Cozaar. He takes a baby aspirin daily. He smokes one pack per day drinks socially alcohol but does not use nonprescription drugs or caffeine. He has been working for many years at [employer] and is a Education officer, museum. His 12 hour shift will begin at 4 AM and an's at 4:30 PM, other days he will start at 8 AM to 8 PM. He still feels that he gets nighttime sleep and rest.  Sleep habits are as  follows:  Usually the patient will come home after work to eat supper with his spouse, he may do some gardening or lawnmowing. And evening as usually spend watching TV. The patient had neck surgery, his wife hip surgery and there not participating in a regular exercise regimen at this time. Bedtime is usually around 10:30 during the week. The patient reports falling asleep rather promptly, he sleeps on his side, on one pillow. The bedroom is quiet, cool and dark with a ceiling fan. He has up to 3 bathroom breaks each night to go to the bathroom and he wakes up frequently for other reasons 2. He has hip pain, he wakes up and goes back to sleep but he wakes up frequently enough to sleep is fragmented. He rises at the morning at about 3 AM to go to his early shift. He feels that he gets his best sleep just before he has to rise at 3 AM. For this reason he will shift to the 8 to 8 shift only. Currently he rises at 6:50 AM and off this way gets about 5-6 hours of sleep for sure. Most mornings he will be refreshed and restored when waking up. He does not wake with headaches, nausea dizziness chest pain or shortness of breath.  REVIEW OF SYSTEMS: Full 14 system review of systems performed and notable only for those listed, all others are neg:  Constitutional: neg  Cardiovascular: neg Ear/Nose/Throat: neg  Skin: neg  Eyes: neg Respiratory: neg Gastroitestinal: neg  Hematology/Lymphatic: Easy bruising   Endocrine: neg Musculoskeletal:neg Allergy/Immunology: neg Neurological: neg Psychiatric: neg Sleep : Obstructive sleep apnea with CPAP   ALLERGIES: No Known Allergies  HOME MEDICATIONS: Outpatient Medications Prior to Visit  Medication Sig Dispense Refill  . atorvastatin (LIPITOR) 20 MG tablet Take 20 mg by mouth daily.    Marland Kitchen esomeprazole (NEXIUM) 40 MG packet Take 40 mg by mouth daily before breakfast. 30 each 12  . metoprolol succinate (TOPROL-XL) 25 MG 24 hr tablet Take 25 mg by mouth daily.       . Multiple Vitamins-Minerals (MULTIVITAMIN PO) Take 1 tablet by mouth daily.     No facility-administered medications prior to visit.     PAST MEDICAL HISTORY: Past Medical History:  Diagnosis Date  . Colitis   . Coronary artery disease 12/06   stent LAD in 2006  . GERD (gastroesophageal reflux disease)   . HTN (hypertension)   . Hypercholesteremia   . Snoring     PAST SURGICAL HISTORY: Past Surgical History:  Procedure Laterality Date  . APPENDECTOMY    . bilateral ankle fractures    . CARPAL TUNNEL RELEASE    . ELBOW SURGERY Right   . EXTERNAL EAR SURGERY    . heart stint    . NECK SURGERY      FAMILY HISTORY: Family History  Problem Relation Age of Onset  . COPD Father     SOCIAL HISTORY: Social History   Socioeconomic History  . Marital status: Married    Spouse name: Not on file  . Number of children: 3  . Years of education: HS  . Highest education level: Not on file  Occupational History    Employer: [employer]  Social Needs  . Financial resource strain: Not on file  . Food insecurity:    Worry: Not on file    Inability: Not on file  . Transportation needs:    Medical: Not on file    Non-medical: Not on file  Tobacco Use  . Smoking status: Current Every Day Smoker    Packs/day: 1.00    Types: Cigarettes    Last attempt to quit: 05/19/1996    Years since quitting: 21.5  . Smokeless tobacco: Never Used  Substance and Sexual Activity  . Alcohol use: Yes    Comment: social  . Drug use: No  . Sexual activity: Yes    Birth control/protection: None  Lifestyle  . Physical activity:    Days per week: Not on file    Minutes per session: Not on file  . Stress: Not on file  Relationships  . Social connections:    Talks on phone: Not on file    Gets together: Not on file    Attends religious service: Not on file    Active member of club or organization: Not on file    Attends meetings of clubs or organizations: Not on file     Relationship status: Not on file  . Intimate partner violence:    Fear of current or ex partner: Not on file    Emotionally abused: Not on file    Physically abused: Not on file    Forced sexual activity: Not on file  Other Topics Concern  . Not on file  Social History Narrative   Lives at home with his wife.   Right-handed.   No caffeine use.     PHYSICAL EXAM  Vitals:   12/15/17 1330  BP: Marland Kitchen)  141/88  Pulse: 77  Weight: 187 lb 6.4 oz (85 kg)  Height: 5\' 7"  (1.702 m)   Body mass index is 29.35 kg/m.  Generalized: Well developed, in no acute distress  Head: normocephalic and atraumatic,. Oropharynx benign mallopatti3 Neck: Supple, circumference 16.5 Lungs clear Musculoskeletal: No deformity  Skin no rash or edema Neurological examination   Mentation: Alert oriented to time, place, history taking. Attention span and concentration appropriate. Recent and remote memory intact.  Follows all commands speech and language fluent.   Cranial nerve II-XII: Pupils were equal round reactive to light extraocular movements were full, visual field were full on confrontational test. Facial sensation and strength were normal. hearing was intact to finger rubbing bilaterally. Uvula tongue midline. head turning and shoulder shrug were normal and symmetric.Tongue protrusion into cheek strength was normal. Motor: normal bulk and tone, full strength in the BUE, BLE, Sensory: normal and symmetric to light touch,  Coordination: finger-nose-finger, heel-to-shin bilaterally, no dysmetria Reflexes: Symmetric upper and lower plantar responses were flexor bilaterally. Gait and Station: Rising up from seated position without assistance, normal stance,  moderate stride, good arm swing, smooth turning, able to perform tiptoe, and heel walking without difficulty. Tandem gait is steady  DIAGNOSTIC DATA (LABS, IMAGING, TESTING) - I reviewed patient records, labs, notes, testing and imaging myself where  available.  Lab Results  Component Value Date   WBC 6.8 08/23/2013   HGB 14.6 08/23/2013   HCT 43.3 08/23/2013   MCV 94.5 08/23/2013   PLT 257 08/23/2013      Component Value Date/Time   NA 133 (L) 11/10/2015 1715   K 4.2 11/10/2015 1715   CL 98 (L) 11/10/2015 1715   CO2 26 11/10/2015 1715   GLUCOSE 87 11/10/2015 1715   BUN 10 11/10/2015 1715   CREATININE 1.33 (H) 11/10/2015 1715   CALCIUM 9.2 11/10/2015 1715   PROT 6.7 08/23/2013 0348   ALBUMIN 3.5 08/23/2013 0348   AST 37 08/23/2013 0348   ALT 51 08/23/2013 0348   ALKPHOS 91 08/23/2013 0348   BILITOT 0.4 08/23/2013 0348   GFRNONAA 57 (L) 11/10/2015 1715   GFRAA >60 11/10/2015 1715    ASSESSMENT AND PLAN  62 y.o. year old male  has a past medical history of Colitis, Coronary artery disease (12/06), GERD (gastroesophageal reflux disease), HTN (hypertension), Hypercholesteremia, and Snoring.  Recently diagnosed with obstructive sleep apnea  on CPAP.data dated 11/14/2017-12/13/2017 shows compliance 4 hours at 77% for 23 days.  Average usage 5 hours 25 minutes.  Set pressure 10 cm.  EPR level 3 leak 95th percentile 20.  AHI 7.4.  ESS 8 FSS 18.    CPAP compliance 77% greater than 4 hours Increased pressure to 11 cm  F/U 4 months for repeat compliance Nilda Riggs, [facility], [facility], APRN  [facility] [address] [place], [place] [address] [phone]

## 2017-12-15 ENCOUNTER — Encounter: Payer: Self-pay | Admitting: Nurse Practitioner

## 2017-12-15 ENCOUNTER — Ambulatory Visit: Payer: 59 | Admitting: Nurse Practitioner

## 2017-12-15 VITALS — BP 141/88 | HR 77 | Ht 67.0 in | Wt 187.4 lb

## 2017-12-15 DIAGNOSIS — G4733 Obstructive sleep apnea (adult) (pediatric): Secondary | ICD-10-CM

## 2017-12-15 DIAGNOSIS — Z9989 Dependence on other enabling machines and devices: Secondary | ICD-10-CM | POA: Diagnosis not present

## 2017-12-15 NOTE — Patient Instructions (Signed)
ThisCPAP compliance 77% Increased pressure to 11 cm  F/U 4 months for repeat compliance

## 2017-12-16 NOTE — Progress Notes (Signed)
Received order [facility] taylor kiger. 12/16/2017 sy

## 2018-01-26 ENCOUNTER — Encounter: Payer: Self-pay | Admitting: Cardiology

## 2018-02-01 NOTE — Progress Notes (Deleted)
Cardiology Office Note    Date:  02/01/2018   ID:  Jason Munoz, DOB 03-18-1955, MRN [id number]  PCP:  Geoffry Paradise, MD  Cardiologist:   [place], MD    History of Present Illness:  Jason Munoz is a 62 y.o. male seen for follow up CAD. Last seen in August 2014. Has known CAD with single vessel CAD with stenting of the proximal LAD in 2006 with a Vision BMS placed. He had a normal stress Myoview in 2007. He had a normal stress Echo in 2012. In 2014 he had a myoview study that showed a fixed inferior defect. No ischemia and normal EF. Other issues include GERD and HLD. He is followed by Neurology for OSA.  On follow up today he is doing very well from a cardiac standpoint. He denies any chest pain, SOB, palpitations. He is active. He has developed cervical neuralgia and is planning to have cervical fusion with Dr. Danielle Dess. He wants to make sure he is safe to have surgery done.  Past Medical History:  Diagnosis Date  . Colitis   . Coronary artery disease 12/06   stent LAD in 2006  . GERD (gastroesophageal reflux disease)   . HTN (hypertension)   . Hypercholesteremia   . Snoring     Past Surgical History:  Procedure Laterality Date  . APPENDECTOMY    . bilateral ankle fractures    . CARPAL TUNNEL RELEASE    . ELBOW SURGERY Right   . EXTERNAL EAR SURGERY    . heart stint    . NECK SURGERY      Current Medications: Outpatient Medications Prior to Visit  Medication Sig Dispense Refill  . atorvastatin (LIPITOR) 20 MG tablet Take 20 mg by mouth daily.    Marland Kitchen esomeprazole (NEXIUM) 40 MG packet Take 40 mg by mouth daily before breakfast. 30 each 12  . metoprolol succinate (TOPROL-XL) 25 MG 24 hr tablet Take 25 mg by mouth daily.      . Multiple Vitamins-Minerals (MULTIVITAMIN PO) Take 1 tablet by mouth daily.     No facility-administered medications prior to visit.      Allergies:   Patient has no known allergies.   Social History   Socioeconomic History  .  Marital status: Married    Spouse name: Not on file  . Number of children: 3  . Years of education: HS  . Highest education level: Not on file  Occupational History    Employer: [employer]  Social Needs  . Financial resource strain: Not on file  . Food insecurity:    Worry: Not on file    Inability: Not on file  . Transportation needs:    Medical: Not on file    Non-medical: Not on file  Tobacco Use  . Smoking status: Current Every Day Smoker    Packs/day: 1.00    Types: Cigarettes    Last attempt to quit: 05/19/1996    Years since quitting: 21.7  . Smokeless tobacco: Never Used  Substance and Sexual Activity  . Alcohol use: Yes    Comment: social  . Drug use: No  . Sexual activity: Yes    Birth control/protection: None  Lifestyle  . Physical activity:    Days per week: Not on file    Minutes per session: Not on file  . Stress: Not on file  Relationships  . Social connections:    Talks on phone: Not on file    Gets together: Not on  file    Attends religious service: Not on file    Active member of club or organization: Not on file    Attends meetings of clubs or organizations: Not on file    Relationship status: Not on file  Other Topics Concern  . Not on file  Social History Narrative   Lives at home with his wife.   Right-handed.   No caffeine use.     Family History:  The patient's family history includes COPD in his father.   ROS:   Please see the history of present illness.    ROS All other systems reviewed and are negative.   PHYSICAL EXAM:   VS:  There were no vitals taken for this visit.   GEN: Well nourished, well developed, in no acute distress  HEENT: normal  Neck: no JVD, carotid bruits, or masses Cardiac: RRR; no murmurs, rubs, or gallops,no edema  Respiratory:  clear to auscultation bilaterally, normal work of breathing GI: soft, nontender, nondistended, + BS MS: no deformity or atrophy  Skin: warm and dry, no rash Neuro:  Alert and  Oriented x 3, Strength and sensation are intact Psych: euthymic mood, full affect  Wt Readings from Last 3 Encounters:  12/15/17 187 lb 6.4 oz (85 kg)  07/05/17 182 lb 8 oz (82.8 kg)  04/23/17 192 lb (87.1 kg)      Studies/Labs Reviewed:   EKG:  EKG is ordered today.  The ekg ordered today demonstrates NSR with normal Ecg. I have personally reviewed and interpreted this study.   Recent Labs: No results found for requested labs within last 8760 hours.   Lipid Panel No results found for: CHOL, TRIG, HDL, CHOLHDL, VLDL, LDLCALC, LDLDIRECT  Additional studies/ records that were reviewed today include:   Labs dated 01/03/16: cholesterol 146, triglycerides 114, HDL 61, LDL 62. CMET, CBC, TSH normal. A1c 5.5% Dated 01/24/18: cholesterol 159, triglycerides 166, HDL 38, LDL 88. A1c 5.2%. creatinine 1.2. otherwise CBC, chemistries and TSH normal.    Stress Myoview 10/19/15: Cardiology Nuclear Med Study  Jason Munoz is a 62 y.o. male     MRN : [id number]     DOB: 09-23-1955  Procedure Date: 10/18/2012  Nuclear Med Background Indication for Stress Test:  Evaluation for Ischemia and Stent Patency History:  '06 Stent-LAD; '07 NWG:NFAOZH, EF=74% Cardiac Risk Factors: Hypertension, Lipids and Smoker-Occasional Cigar  Symptoms:  Chest Tightness (last episode of chest discomfort was about a month ago), Fatigue and SOB   Nuclear Pre-Procedure Caffeine/Decaff Intake:  None NPO After: 8:00pm   Lungs:  Clear. O2 Sat: 96% on room air. IV 0.9% NS with Angio Cath:  20g  IV Site: R Hand  IV Started by:  Cathlyn Parsons, RN  Chest Size (in):  42 Cup Size: n/a  Height: 5\' 7"  (1.702 m)  Weight:  171 lb (77.565 kg)  BMI:  Body mass index is 26.78 kg/(m^2). Tech Comments:  No Toprol x 48 hrs   Nuclear Med Study 1 or 2 day study: 1 day  Stress Test Type:  Stress  Reading MD: Kristeen Miss, MD  Order Authorizing Provider:   [place], MD  Resting Radionuclide: Technetium 42m Sestamibi   Resting Radionuclide Dose: 11.0 mCi   Stress Radionuclide:  Technetium 69m Sestamibi  Stress Radionuclide Dose: 33.0 mCi           Stress Protocol Rest HR: 78 Stress HR: 146  Rest BP: 132/88 Stress BP: 191/90  Exercise Time (min): 10:00 METS: 11.7  Predicted Max HR: 165 bpm % Max HR: 88.48 bpm Rate Pressure Product: [phone]   Dose of Adenosine (mg):  n/a Dose of Lexiscan: n/a mg  Dose of Atropine (mg): n/a Dose of Dobutamine: n/a mcg/kg/min (at max HR)  Stress Test Technologist: Smiley HousemanSherri Ballard, CMA-N  Nuclear Technologist:  Domenic PoliteStephen Carbone, CNMT     Rest Procedure:  Myocardial perfusion imaging was performed at rest 45 minutes following the intravenous administration of Technetium 2217m Sestamibi.  Rest ECG: NSR - Normal EKG  Stress Procedure:  The patient exercised on the treadmill utilizing the Bruce Protocol for ten minutes. The patient stopped due to fatigue and denied any chest pain.  Technetium 5617m Sestamibi was injected at peak exercise and myocardial perfusion imaging was performed after a brief delay.  Stress ECG: No significant change from baseline ECG  QPS Raw Data Images:  Mild diaphragmatic attenuation.  Normal left ventricular size. Stress Images:  There is a medium sized area of moderate attenuation of the mid and basal inferior and mid/basal lateral  wall.  The remaining myocardium has normal uptake.       Rest Images:  There is a medium sized area of moderate attenuation of the mid and basal inferior and mid/basal lateral  wall.  The remaining myocardium has normal uptake.    Subtraction (SDS):  No evidence of ischemia.  There is a fixed defect that may be due to a previous inferior lateral MI vs diaphragmatic attenuation. Transient Ischemic Dilatation (Normal <1.22):  n/a Lung/Heart Ratio (Normal <0.45):  0.39  Quantitative Gated Spect Images QGS EDV:  90 ml QGS ESV:  32 ml  Impression Exercise Capacity:  Excellent exercise capacity. BP Response:   Normal blood pressure response. Clinical Symptoms:  No significant symptoms noted. ECG Impression:  No significant ST segment change suggestive of ischemia. Comparison with Prior Nuclear Study: The fixed inferior lateral defect is new from the previous Myoview study 01/21/06.  Overall Impression:  Low risk stress nuclear study .  There is a fixed defect in the inferior lateral walls that may be due to a previous inferior lateral MI vs. diaphragmatic attenuation.    The inferior and lateral walls contract normally. .  LV Ejection Fraction: 65%.  LV Wall Motion:  NL LV Function; NL Wall Motion.   Vesta MixerPhilip J. Nahser, Montez HagemanJr., MD, [facility] 10/18/2012, 4:50 PM Office - [phone] Pager [phone]    ASSESSMENT:    No diagnosis found.   PLAN:  In order of problems listed above:  1. Patient is asymptomatic from a cardiac standpoint. He is cleared to proceed with cervical fusion. May stop ASA one week prior. Continue other medical therapy.  2. Lipids well controlled on statin. 3. Tobacco use. Smoking <1 pp/wk. Encourage complete cessation.  I will follow up in one year.    Medication Adjustments/Labs and Tests Ordered: Current medicines are reviewed at length with the patient today.  Concerns regarding medicines are outlined above.  Medication changes, Labs and Tests ordered today are listed in the Patient Instructions below. There are no Patient Instructions on file for this visit.   Signed,  [place], MD  02/01/2018 7:23 AM    [facility] [address], [place], [place], [id number] [phone]

## 2018-02-03 ENCOUNTER — Ambulatory Visit: Payer: 59 | Admitting: Cardiology

## 2018-03-02 ENCOUNTER — Institutional Professional Consult (permissible substitution): Payer: 59 | Admitting: Neurology

## 2018-03-29 NOTE — Progress Notes (Signed)
Cardiology Office Note    Date:  04/04/2018   ID:  Jason QuillSteven L Munoz, DOB Aug 05, 1955, MRN [id number]  PCP:  Jason Munoz, Richard, MD  Cardiologist:   [place], MD    History of Present Illness:  Jason Munoz is a 63 y.o. male seen for follow up CAD. Has known CAD with single vessel CAD with stenting of the proximal LAD in 2006 with a Vision BMS placed. He had a normal stress Myoview in 2007. He had a normal stress Echo in 2012. In 2014 he had a myoview study that showed a fixed inferior defect. No ischemia and normal EF. Other issues include GERD and HLD.   He was  seen 2 years ago for pre op clearance for cervical fusion and was doing well from a cardiac standpoint. He has OSA followed by Jason Munoz. Recently had carpal tunnel surgery on the left.   On follow up today he is doing very well from a cardiac standpoint. He denies chest pain, dyspnea, palpitations or dizziness. Quit smoking for 5 months then resumed for a month. Now stopped again this week. He walks on a treadmill some. Has a new house and planning on a lot of yard work.  Past Medical History:  Diagnosis Date  . Colitis   . Coronary artery disease 12/06   stent LAD in 2006  . GERD (gastroesophageal reflux disease)   . HTN (hypertension)   . Hypercholesteremia   . Snoring     Past Surgical History:  Procedure Laterality Date  . APPENDECTOMY    . bilateral ankle fractures    . CARPAL TUNNEL RELEASE    . ELBOW SURGERY Right   . EXTERNAL EAR SURGERY    . heart stint    . NECK SURGERY      Current Medications: Outpatient Medications Prior to Visit  Medication Sig Dispense Refill  . atorvastatin (LIPITOR) 20 MG tablet Take 20 mg by mouth daily.    Marland Kitchen. esomeprazole (NEXIUM) 40 MG packet Take 40 mg by mouth daily before breakfast. 30 each 12  . losartan-hydrochlorothiazide (HYZAAR) 100-25 MG tablet Take 1 tablet by mouth daily.    . metoprolol succinate (TOPROL-XL) 25 MG 24 hr tablet Take 25 mg by mouth daily.        . Multiple Vitamins-Minerals (MULTIVITAMIN PO) Take 1 tablet by mouth daily.    . valACYclovir (VALTREX) 500 MG tablet TAKE 1 TABLET BY MOUTH TWICE A DAY AS DIRECTED     No facility-administered medications prior to visit.      Allergies:   Patient has no known allergies.   Social History   Socioeconomic History  . Marital status: Married    Spouse name: Not on file  . Number of children: 3  . Years of education: HS  . Highest education level: Not on file  Occupational History    Employer: [employer]  Social Needs  . Financial resource strain: Not on file  . Food insecurity:    Worry: Not on file    Inability: Not on file  . Transportation needs:    Medical: Not on file    Non-medical: Not on file  Tobacco Use  . Smoking status: Current Every Day Smoker    Packs/day: 1.00    Types: Cigarettes    Last attempt to quit: 05/19/1996    Years since quitting: 21.8  . Smokeless tobacco: Never Used  Substance and Sexual Activity  . Alcohol use: Yes    Comment: social  .  Drug use: No  . Sexual activity: Yes    Birth control/protection: None  Lifestyle  . Physical activity:    Days per week: Not on file    Minutes per session: Not on file  . Stress: Not on file  Relationships  . Social connections:    Talks on phone: Not on file    Gets together: Not on file    Attends religious service: Not on file    Active member of club or organization: Not on file    Attends meetings of clubs or organizations: Not on file    Relationship status: Not on file  Other Topics Concern  . Not on file  Social History Narrative   Lives at home with his wife.   Right-handed.   No caffeine use.     Family History:  The patient's family history includes COPD in his father.   ROS:   Please see the history of present illness.    ROS All other systems reviewed and are negative.   PHYSICAL EXAM:   VS:  BP 120/80   Pulse 61   Ht 5\' 7"  (1.702 m)   Wt 187 lb 9.6 oz (85.1 kg)    SpO2 95%   BMI 29.38 kg/m    GENERAL:  Well appearing WM in NAD HEENT:  PERRL, EOMI, sclera are clear. Oropharynx is clear. NECK:  No jugular venous distention, carotid upstroke brisk and symmetric, no bruits, no thyromegaly or adenopathy LUNGS:  Clear to auscultation bilaterally CHEST:  Unremarkable HEART:  RRR,  PMI not displaced or sustained,S1 and S2 within normal limits, no S3, no S4: no clicks, no rubs, no murmurs ABD:  Soft, nontender. BS +, no masses or bruits. No hepatomegaly, no splenomegaly EXT:  2 + pulses throughout, no edema, no cyanosis no clubbing SKIN:  Warm and dry.  No rashes NEURO:  Alert and oriented x 3. Cranial nerves II through XII intact. PSYCH:  Cognitively intact    Wt Readings from Last 3 Encounters:  04/04/18 187 lb 9.6 oz (85.1 kg)  12/15/17 187 lb 6.4 oz (85 kg)  07/05/17 182 lb 8 oz (82.8 kg)      Studies/Labs Reviewed:   EKG:  EKG is ordered today.  The ekg ordered today demonstrates NSR with normal Ecg. Rate 61. I have personally reviewed and interpreted this study.   Recent Labs: No results found for requested labs within last 8760 hours.   Lipid Panel No results found for: CHOL, TRIG, HDL, CHOLHDL, VLDL, LDLCALC, LDLDIRECT  Additional studies/ records that were reviewed today include:   Labs dated 01/03/16: cholesterol 146, triglycerides 114, HDL 61, LDL 62. CMET, CBC, TSH normal. A1c 5.5% Dated 06/15/17: Normal CMET except AST 48, ALT 53.  Dated 01/24/18: cholesterol 159, triglycerides 166, HDL 38, LDL 88. A1c 5.2%. creatinine 1.2. otherwise Chemistries, CBC, TSH normal.   Stress Myoview 10/19/15: Cardiology Nuclear Med Study  Jason Munoz is a 63 y.o. male     MRN : [id number]     DOB: 03/28/1955  Procedure Date: 10/18/2012  Nuclear Med Background Indication for Stress Test:  Evaluation for Ischemia and Stent Patency History:  '06 Stent-LAD; '07 EKC:[id number], EF=74% Cardiac Risk Factors: Hypertension, Lipids and Smoker-Occasional  Cigar  Symptoms:  Chest Tightness (last episode of chest discomfort was about a month ago), Fatigue and SOB   Nuclear Pre-Procedure Caffeine/Decaff Intake:  None NPO After: 8:00pm   Lungs:  Clear. O2 Sat: 96% on room air. IV 0.9% NS  with Angio Cath:  20g  IV Site: R Hand  IV Started by:  Cathlyn Parsonsynthia Hasspacher, RN  Chest Size (in):  42 Cup Size: n/a  Height: 5\' 7"  (1.702 m)  Weight:  171 lb (77.565 kg)  BMI:  Body mass index is 26.78 kg/(m^2). Tech Comments:  No Toprol x 48 hrs   Nuclear Med Study 1 or 2 day study: 1 day  Stress Test Type:  Stress  Reading MD: Kristeen MissPhilip Nahser, MD  Order Authorizing Provider:   [place], MD  Resting Radionuclide: Technetium 4068m Sestamibi  Resting Radionuclide Dose: 11.0 mCi   Stress Radionuclide:  Technetium 6368m Sestamibi  Stress Radionuclide Dose: 33.0 mCi           Stress Protocol Rest HR: 78 Stress HR: 146  Rest BP: 132/88 Stress BP: 191/90  Exercise Time (min): 10:00 METS: 11.7   Predicted Max HR: 165 bpm % Max HR: 88.48 bpm Rate Pressure Product: [id number]   Dose of Adenosine (mg):  n/a Dose of Lexiscan: n/a mg  Dose of Atropine (mg): n/a Dose of Dobutamine: n/a mcg/kg/min (at max HR)  Stress Test Technologist: Smiley HousemanSherri Ballard, CMA-N  Nuclear Technologist:  Domenic PoliteStephen Carbone, CNMT     Rest Procedure:  Myocardial perfusion imaging was performed at rest 45 minutes following the intravenous administration of Technetium 9168m Sestamibi.  Rest ECG: NSR - Normal EKG  Stress Procedure:  The patient exercised on the treadmill utilizing the Bruce Protocol for ten minutes. The patient stopped due to fatigue and denied any chest pain.  Technetium 2668m Sestamibi was injected at peak exercise and myocardial perfusion imaging was performed after a brief delay.  Stress ECG: No significant change from baseline ECG  QPS Raw Data Images:  Mild diaphragmatic attenuation.  Normal left ventricular size. Stress Images:  There is a medium sized area of  moderate attenuation of the mid and basal inferior and mid/basal lateral  wall.  The remaining myocardium has normal uptake.       Rest Images:  There is a medium sized area of moderate attenuation of the mid and basal inferior and mid/basal lateral  wall.  The remaining myocardium has normal uptake.    Subtraction (SDS):  No evidence of ischemia.  There is a fixed defect that may be due to a previous inferior lateral MI vs diaphragmatic attenuation. Transient Ischemic Dilatation (Normal <1.22):  n/a Lung/Heart Ratio (Normal <0.45):  0.39  Quantitative Gated Spect Images QGS EDV:  90 ml QGS ESV:  32 ml  Impression Exercise Capacity:  Excellent exercise capacity. BP Response:  Normal blood pressure response. Clinical Symptoms:  No significant symptoms noted. ECG Impression:  No significant ST segment change suggestive of ischemia. Comparison with Prior Nuclear Study: The fixed inferior lateral defect is new from the previous Myoview study 01/21/06.  Overall Impression:  Low risk stress nuclear study .  There is a fixed defect in the inferior lateral walls that may be due to a previous inferior lateral MI vs. diaphragmatic attenuation.    The inferior and lateral walls contract normally. .  LV Ejection Fraction: 65%.  LV Wall Motion:  NL LV Function; NL Wall Motion.   Vesta MixerPhilip J. Nahser, Montez HagemanJr., MD, [facility] 10/18/2012, 4:50 PM Office - [phone] Pager [phone]    ASSESSMENT:    1. Coronary artery disease involving native coronary artery of native heart without angina pectoris   2. Hypercholesteremia      PLAN:  In order of problems listed above:  1. Patient is asymptomatic  from a cardiac standpoint. We will continue ASA and beta blocker. Stressed importance of smoking cessation.   2. On statin. Last LDL 88. Was 62 in 2017. Work on increasing activity and healthy diet.  3. Tobacco use.  Encourage complete cessation.  I will follow up in one year.    Medication Adjustments/Labs  and Tests Ordered: Current medicines are reviewed at length with the patient today.  Concerns regarding medicines are outlined above.  Medication changes, Labs and Tests ordered today are listed in the Patient Instructions below. There are no Patient Instructions on file for this visit.   Signed,  [place], MD  04/04/2018 10:25 AM    [facility] [address], [place], [place], [address] [phone]

## 2018-04-04 ENCOUNTER — Encounter: Payer: Self-pay | Admitting: Cardiology

## 2018-04-04 ENCOUNTER — Ambulatory Visit: Payer: 59 | Admitting: Cardiology

## 2018-04-04 VITALS — BP 120/80 | HR 61 | Ht 67.0 in | Wt 187.6 lb

## 2018-04-04 DIAGNOSIS — I2510 Atherosclerotic heart disease of native coronary artery without angina pectoris: Secondary | ICD-10-CM | POA: Diagnosis not present

## 2018-04-04 DIAGNOSIS — E7800 Pure hypercholesterolemia, unspecified: Secondary | ICD-10-CM | POA: Diagnosis not present

## 2018-04-18 ENCOUNTER — Ambulatory Visit: Payer: 59 | Admitting: Nurse Practitioner

## 2018-04-18 NOTE — Progress Notes (Deleted)
[facility]  PATIENT: Jason Munoz DOB: 12-18-1955   REASON FOR VISIT: Sleep apnea here for  CPAP compliance HISTORY FROM: Patient   HISTORY OF PRESENT ILLNESS: Update 10/30/2019CM Jason Munoz, 63 year old male returns for follow-up with history of obstructive sleep apnea here for CPAP compliance.  He claims he had trouble getting his supplies recently so did not use his CPAP.  Compliance data dated 11/14/2017-12/13/2017 shows compliance 4 hours at 77% for 23 days.  Average usage 5 hours 25 minutes.  Set pressure 10 cm.  EPR level 3 leak 95th percentile 20.  AHI 7.4.  ESS 8 FSS 18.  He returns for reevaluation     UPDATE 3/8/2019CM Jason Munoz, 63 year old male returns for follow-up for CPAP compliance.  Sleep study done 11/20/2016 shows hypoxemia with mild obstructive sleep apnea and CPAP was recommended.  He has been on his machine for approximately.  He does complain with his mask not fitting properly and he was made aware to follow-up with the equipment company regarding this.  Compliance data dated 03/24/2017-04/22/2017 6 compliance greater than 4 hours at 90%.  Average usage 7 hours 37 minutes.  Set pressure 10 cm.  EPR level 3 AHI 0.6 ESS 7.  He returns for reevaluation   6/26/18CD Jason Munoz is a 63 y.o. male , seen here as in a referral/ revisit  from Dr. Jacky Kindle for sleep consultation. Mr. Novello reports tinnitus, fatigue, snoring, erectile dysfunction and a history of high cholesterol. He has also been treated for GERD. Hypertension has been treated with Toprol and Cozaar. He takes a baby aspirin daily. He smokes one pack per day drinks socially alcohol but does not use nonprescription drugs or caffeine. He has been working for many years at [employer] and is a Education officer, museum. His 12 hour shift will begin at 4 AM and an's at 4:30 PM, other days he will start at 8 AM to 8 PM. He still feels that he gets nighttime sleep and rest.  Sleep habits are as  follows:  Usually the patient will come home after work to eat supper with his spouse, he may do some gardening or lawnmowing. And evening as usually spend watching TV. The patient had neck surgery, his wife hip surgery and there not participating in a regular exercise regimen at this time. Bedtime is usually around 10:30 during the week. The patient reports falling asleep rather promptly, he sleeps on his side, on one pillow. The bedroom is quiet, cool and dark with a ceiling fan. He has up to 3 bathroom breaks each night to go to the bathroom and he wakes up frequently for other reasons 2. He has hip pain, he wakes up and goes back to sleep but he wakes up frequently enough to sleep is fragmented. He rises at the morning at about 3 AM to go to his early shift. He feels that he gets his best sleep just before he has to rise at 3 AM. For this reason he will shift to the 8 to 8 shift only. Currently he rises at 6:50 AM and off this way gets about 5-6 hours of sleep for sure. Most mornings he will be refreshed and restored when waking up. He does not wake with headaches, nausea dizziness chest pain or shortness of breath.  REVIEW OF SYSTEMS: Full 14 system review of systems performed and notable only for those listed, all others are neg:  Constitutional: neg  Cardiovascular: neg Ear/Nose/Throat: neg  Skin: neg  Eyes: neg Respiratory: neg Gastroitestinal: neg  Hematology/Lymphatic: Easy bruising   Endocrine: neg Musculoskeletal:neg Allergy/Immunology: neg Neurological: neg Psychiatric: neg Sleep : Obstructive sleep apnea with CPAP   ALLERGIES: No Known Allergies  HOME MEDICATIONS: Outpatient Medications Prior to Visit  Medication Sig Dispense Refill  . atorvastatin (LIPITOR) 20 MG tablet Take 20 mg by mouth daily.    Marland Kitchen esomeprazole (NEXIUM) 40 MG packet Take 40 mg by mouth daily before breakfast. 30 each 12  . losartan-hydrochlorothiazide (HYZAAR) 100-25 MG tablet Take 1 tablet by mouth  daily.    . metoprolol succinate (TOPROL-XL) 25 MG 24 hr tablet Take 25 mg by mouth daily.      . Multiple Vitamins-Minerals (MULTIVITAMIN PO) Take 1 tablet by mouth daily.    . valACYclovir (VALTREX) 500 MG tablet TAKE 1 TABLET BY MOUTH TWICE A DAY AS DIRECTED     No facility-administered medications prior to visit.     PAST MEDICAL HISTORY: Past Medical History:  Diagnosis Date  . Colitis   . Coronary artery disease 12/06   stent LAD in 2006  . GERD (gastroesophageal reflux disease)   . HTN (hypertension)   . Hypercholesteremia   . Snoring     PAST SURGICAL HISTORY: Past Surgical History:  Procedure Laterality Date  . APPENDECTOMY    . bilateral ankle fractures    . CARPAL TUNNEL RELEASE    . ELBOW SURGERY Right   . EXTERNAL EAR SURGERY    . heart stint    . NECK SURGERY      FAMILY HISTORY: Family History  Problem Relation Age of Onset  . COPD Father     SOCIAL HISTORY: Social History   Socioeconomic History  . Marital status: Married    Spouse name: Not on file  . Number of children: 3  . Years of education: HS  . Highest education level: Not on file  Occupational History    Employer: [employer]  Social Needs  . Financial resource strain: Not on file  . Food insecurity:    Worry: Not on file    Inability: Not on file  . Transportation needs:    Medical: Not on file    Non-medical: Not on file  Tobacco Use  . Smoking status: Current Every Day Smoker    Packs/day: 1.00    Types: Cigarettes    Last attempt to quit: 05/19/1996    Years since quitting: 21.9  . Smokeless tobacco: Never Used  Substance and Sexual Activity  . Alcohol use: Yes    Comment: social  . Drug use: No  . Sexual activity: Yes    Birth control/protection: None  Lifestyle  . Physical activity:    Days per week: Not on file    Minutes per session: Not on file  . Stress: Not on file  Relationships  . Social connections:    Talks on phone: Not on file    Gets together:  Not on file    Attends religious service: Not on file    Active member of club or organization: Not on file    Attends meetings of clubs or organizations: Not on file    Relationship status: Not on file  . Intimate partner violence:    Fear of current or ex partner: Not on file    Emotionally abused: Not on file    Physically abused: Not on file    Forced sexual activity: Not on file  Other Topics Concern  . Not on file  Social History Narrative   Lives at home with his wife.   Right-handed.   No caffeine use.     PHYSICAL EXAM  There were no vitals filed for this visit. There is no height or weight on file to calculate BMI.  Generalized: Well developed, in no acute distress  Head: normocephalic and atraumatic,. Oropharynx benign mallopatti3 Neck: Supple, circumference 16.5 Lungs clear Musculoskeletal: No deformity  Skin no rash or edema Neurological examination   Mentation: Alert oriented to time, place, history taking. Attention span and concentration appropriate. Recent and remote memory intact.  Follows all commands speech and language fluent.   Cranial nerve II-XII: Pupils were equal round reactive to light extraocular movements were full, visual field were full on confrontational test. Facial sensation and strength were normal. hearing was intact to finger rubbing bilaterally. Uvula tongue midline. head turning and shoulder shrug were normal and symmetric.Tongue protrusion into cheek strength was normal. Motor: normal bulk and tone, full strength in the BUE, BLE, Sensory: normal and symmetric to light touch,  Coordination: finger-nose-finger, heel-to-shin bilaterally, no dysmetria Reflexes: Symmetric upper and lower plantar responses were flexor bilaterally. Gait and Station: Rising up from seated position without assistance, normal stance,  moderate stride, good arm swing, smooth turning, able to perform tiptoe, and heel walking without difficulty. Tandem gait is  steady  DIAGNOSTIC DATA (LABS, IMAGING, TESTING) - I reviewed patient records, labs, notes, testing and imaging myself where available.  Lab Results  Component Value Date   WBC 6.8 08/23/2013   HGB 14.6 08/23/2013   HCT 43.3 08/23/2013   MCV 94.5 08/23/2013   PLT 257 08/23/2013      Component Value Date/Time   NA 133 (L) 11/10/2015 1715   K 4.2 11/10/2015 1715   CL 98 (L) 11/10/2015 1715   CO2 26 11/10/2015 1715   GLUCOSE 87 11/10/2015 1715   BUN 10 11/10/2015 1715   CREATININE 1.33 (H) 11/10/2015 1715   CALCIUM 9.2 11/10/2015 1715   PROT 6.7 08/23/2013 0348   ALBUMIN 3.5 08/23/2013 0348   AST 37 08/23/2013 0348   ALT 51 08/23/2013 0348   ALKPHOS 91 08/23/2013 0348   BILITOT 0.4 08/23/2013 0348   GFRNONAA 57 (L) 11/10/2015 1715   GFRAA >60 11/10/2015 1715    ASSESSMENT AND PLAN  63 y.o. year old male  has a past medical history of Colitis, Coronary artery disease (12/06), GERD (gastroesophageal reflux disease), HTN (hypertension), Hypercholesteremia, and Snoring.  Recently diagnosed with obstructive sleep apnea  on CPAP.data dated 11/14/2017-12/13/2017 shows compliance 4 hours at 77% for 23 days.  Average usage 5 hours 25 minutes.  Set pressure 10 cm.  EPR level 3 leak 95th percentile 20.  AHI 7.4.  ESS 8 FSS 18.    CPAP compliance 77% greater than 4 hours Increased pressure to 11 cm  F/U 4 months for repeat compliance Nilda Riggs, [facility], [facility], APRN  [facility] [address] [place], [place] [address] [phone]

## 2018-04-19 ENCOUNTER — Ambulatory Visit: Payer: 59 | Admitting: Nurse Practitioner

## 2018-04-24 ENCOUNTER — Encounter: Payer: Self-pay | Admitting: Nurse Practitioner

## 2018-04-25 NOTE — Progress Notes (Signed)
[facility]  PATIENT: Jason Munoz DOB: 1955/06/20   REASON FOR VISIT: Sleep apnea here for  CPAP compliance HISTORY FROM: Patient   HISTORY OF PRESENT ILLNESS: UPDATE 3/10/2020CM Jason Munoz, 63 year old male returns for follow-up with history of obstructive sleep apnea here for CPAP compliance.  He has been doing okay with his machine except occasionally the machine will make funny noises he is not exactly sure what from.  He was advised to take [facility] to [facility] to get them to make sure it is functioning properly.  Compliance data dated 03/26/2018-04/24/2018 shows compliance greater than 4 hours at 70% less than 4 hours at 30% for total compliance of 100% for 30 days.  Average usage 4 hours 36 minutes.  Set pressure 11 cm AHI 3.7 ESS 5.  He returns for reevaluation  Update 10/30/2019CM Jason Munoz, 63 year old male returns for follow-up with history of obstructive sleep apnea here for CPAP compliance.  He claims he had trouble getting his supplies recently so did not use his CPAP.  Compliance data dated 11/14/2017-12/13/2017 shows compliance 4 hours at 77% for 23 days.  Average usage 5 hours 25 minutes.  Set pressure 10 cm.  EPR level 3 leak 95th percentile 20.  AHI 7.4.  ESS 8 FSS 18.  He returns for reevaluation     UPDATE 3/8/2019CM Jason Munoz, 63 year old male returns for follow-up for CPAP compliance.  Sleep study done 11/20/2016 shows hypoxemia with mild obstructive sleep apnea and CPAP was recommended.  He has been on his machine for approximately.  He does complain with his mask not fitting properly and he was made aware to follow-up with the equipment company regarding this.  Compliance data dated 03/24/2017-04/22/2017 6 compliance greater than 4 hours at 90%.  Average usage 7 hours 37 minutes.  Set pressure 10 cm.  EPR level 3 AHI 0.6 ESS 7.  He returns for reevaluation   6/26/18CD Jason Munoz is a 63 y.o. male , seen here as in a referral/ revisit  from Dr. Jacky Kindle  for sleep consultation. Jason Munoz reports tinnitus, fatigue, snoring, erectile dysfunction and a history of high cholesterol. He has also been treated for GERD. Hypertension has been treated with Toprol and Cozaar. He takes a baby aspirin daily. He smokes one pack per day drinks socially alcohol but does not use nonprescription drugs or caffeine. He has been working for many years at [employer] and is a Education officer, museum. His 12 hour shift will begin at 4 AM and an's at 4:30 PM, other days he will start at 8 AM to 8 PM. He still feels that he gets nighttime sleep and rest.  Sleep habits are as follows:  Usually the patient will come home after work to eat supper with his spouse, he may do some gardening or lawnmowing. And evening as usually spend watching TV. The patient had neck surgery, his wife hip surgery and there not participating in a regular exercise regimen at this time. Bedtime is usually around 10:30 during the week. The patient reports falling asleep rather promptly, he sleeps on his side, on one pillow. The bedroom is quiet, cool and dark with a ceiling fan. He has up to 3 bathroom breaks each night to go to the bathroom and he wakes up frequently for other reasons 2. He has hip pain, he wakes up and goes back to sleep but he wakes up frequently enough to sleep is fragmented. He rises at the morning at about 3  AM to go to his early shift. He feels that he gets his best sleep just before he has to rise at 3 AM. For this reason he will shift to the 8 to 8 shift only. Currently he rises at 6:50 AM and off this way gets about 5-6 hours of sleep for sure. Most mornings he will be refreshed and restored when waking up. He does not wake with headaches, nausea dizziness chest pain or shortness of breath.  REVIEW OF SYSTEMS: Full 14 system review of systems performed and notable only for those listed, all others are neg:  Constitutional: neg  Cardiovascular: neg Ear/Nose/Throat: neg  Skin:  neg Eyes: neg Respiratory: neg Gastroitestinal: neg  Hematology/Lymphatic: Easy bruising   Endocrine: neg Musculoskeletal:neg Allergy/Immunology: neg Neurological: neg Psychiatric: neg Sleep : Obstructive sleep apnea with CPAP   ALLERGIES: No Known Allergies  HOME MEDICATIONS: Outpatient Medications Prior to Visit  Medication Sig Dispense Refill  . atorvastatin (LIPITOR) 20 MG tablet Take 20 mg by mouth daily.    Marland Kitchen esomeprazole (NEXIUM) 40 MG packet Take 40 mg by mouth daily before breakfast. 30 each 12  . losartan-hydrochlorothiazide (HYZAAR) 100-25 MG tablet Take 1 tablet by mouth daily.    . metoprolol succinate (TOPROL-XL) 25 MG 24 hr tablet Take 25 mg by mouth daily.      . Multiple Vitamins-Minerals (MULTIVITAMIN PO) Take 1 tablet by mouth daily.    . valACYclovir (VALTREX) 500 MG tablet TAKE 1 TABLET BY MOUTH TWICE A DAY AS DIRECTED     No facility-administered medications prior to visit.     PAST MEDICAL HISTORY: Past Medical History:  Diagnosis Date  . Colitis   . Coronary artery disease 12/06   stent LAD in 2006  . GERD (gastroesophageal reflux disease)   . HTN (hypertension)   . Hypercholesteremia   . Snoring     PAST SURGICAL HISTORY: Past Surgical History:  Procedure Laterality Date  . APPENDECTOMY    . bilateral ankle fractures    . CARPAL TUNNEL RELEASE    . ELBOW SURGERY Right   . EXTERNAL EAR SURGERY    . heart stint    . NECK SURGERY      FAMILY HISTORY: Family History  Problem Relation Age of Onset  . COPD Father     SOCIAL HISTORY: Social History   Socioeconomic History  . Marital status: Married    Spouse name: Not on file  . Number of children: 3  . Years of education: HS  . Highest education level: Not on file  Occupational History    Employer: [employer]  Social Needs  . Financial resource strain: Not on file  . Food insecurity:    Worry: Not on file    Inability: Not on file  . Transportation needs:     Medical: Not on file    Non-medical: Not on file  Tobacco Use  . Smoking status: Current Every Day Smoker    Packs/day: 1.00    Types: Cigarettes    Last attempt to quit: 05/19/1996    Years since quitting: 21.9  . Smokeless tobacco: Never Used  Substance and Sexual Activity  . Alcohol use: Yes    Comment: social  . Drug use: No  . Sexual activity: Yes    Birth control/protection: None  Lifestyle  . Physical activity:    Days per week: Not on file    Minutes per session: Not on file  . Stress: Not on file  Relationships  .  Social connections:    Talks on phone: Not on file    Gets together: Not on file    Attends religious service: Not on file    Active member of club or organization: Not on file    Attends meetings of clubs or organizations: Not on file    Relationship status: Not on file  . Intimate partner violence:    Fear of current or ex partner: Not on file    Emotionally abused: Not on file    Physically abused: Not on file    Forced sexual activity: Not on file  Other Topics Concern  . Not on file  Social History Narrative   Lives at home with his wife.   Right-handed.   No caffeine use.     PHYSICAL EXAM  Vitals:   04/26/18 1420  BP: 109/80  Pulse: 80  Weight: 185 lb 9.6 oz (84.2 kg)  Height:  (1.702 m)   Body mass index is 29.07 kg/m.  Generalized: Well developed, in no acute distress  Head: normocephalic and atraumatic,. Oropharynx benign mallopatti3 Neck: Supple, circumference 16.5 Lungs clear Musculoskeletal: No deformity  Skin no rash or edema Neurological examination   Mentation: Alert oriented to time, place, history taking. Attention span and concentration appropriate. Recent and remote memory intact.  Follows all commands speech and language fluent.   Cranial nerve II-XII: Pupils were equal round reactive to light extraocular movements were full, visual field were full on confrontational test. Facial sensation and strength were  normal. hearing was intact to finger rubbing bilaterally. Uvula tongue midline. head turning and shoulder shrug were normal and symmetric.Tongue protrusion into cheek strength was normal. Motor: normal bulk and tone, full strength in the BUE, BLE, Sensory: normal and symmetric to light touch,  Coordination: finger-nose-finger, heel-to-shin bilaterally, no dysmetria Gait and Station: Rising up from seated position without assistance, normal stance,  moderate stride, good arm swing, smooth turning, able to perform tiptoe, and heel walking without difficulty. Tandem gait is steady  DIAGNOSTIC DATA (LABS, IMAGING, TESTING) - I reviewed patient records, labs, notes, testing and imaging myself where available.  Lab Results  Component Value Date   WBC 6.8 08/23/2013   HGB 14.6 08/23/2013   HCT 43.3 08/23/2013   MCV 94.5 08/23/2013   PLT 257 08/23/2013      Component Value Date/Time   NA 133 (L) 11/10/2015 1715   K 4.2 11/10/2015 1715   CL 98 (L) 11/10/2015 1715   CO2 26 11/10/2015 1715   GLUCOSE 87 11/10/2015 1715   BUN 10 11/10/2015 1715   CREATININE 1.33 (H) 11/10/2015 1715   CALCIUM 9.2 11/10/2015 1715   PROT 6.7 08/23/2013 0348   ALBUMIN 3.5 08/23/2013 0348   AST 37 08/23/2013 0348   ALT 51 08/23/2013 0348   ALKPHOS 91 08/23/2013 0348   BILITOT 0.4 08/23/2013 0348   GFRNONAA 57 (L) 11/10/2015 1715   GFRAA >60 11/10/2015 1715    ASSESSMENT AND PLAN  63 y.o. year old male  has a past medical history of Colitis, Coronary artery disease (12/06), GERD (gastroesophageal reflux disease), HTN (hypertension), Hypercholesteremia, and Snoring.  Recently diagnosed with obstructive sleep apnea  on CPAP.Compliance data dated 03/26/2018-04/24/2018 shows compliance greater than 4 hours at 70% less than 4 hours at 30% for total compliance of 100% for 30 days.  Average usage 4 hours 36 minutes.  Set pressure 11 cm AHI 3.7 ESS 5.   PLAN: CPAP compliance 70% greater than 4 hours, 30% less  than 4 hours  for total compliance of 100% for 30 days Continue same settings For the unusual sound to hear coming from the machine at times take it to the equipment company to get it checked out but also make sure all of the pieces fit together tightly. F/U 8 months for repeat compliance I spent 15 minutes in total face to face time with the patient more than 50% of which was spent counseling and coordination of care, reviewing test results reviewing medications and discussing and reviewing the diagnosis of obstructive sleep apnea and the importance of compliance staying within insurance parameters for the same., Nilda Riggs, [facility], [facility], APRN  [facility] [address] [place], [place] [address] [phone]

## 2018-04-26 ENCOUNTER — Encounter: Payer: Self-pay | Admitting: Nurse Practitioner

## 2018-04-26 ENCOUNTER — Ambulatory Visit: Payer: 59 | Admitting: Nurse Practitioner

## 2018-04-26 VITALS — BP 109/80 | HR 80 | Ht 67.0 in | Wt 185.6 lb

## 2018-04-26 DIAGNOSIS — G4733 Obstructive sleep apnea (adult) (pediatric): Secondary | ICD-10-CM

## 2018-04-26 DIAGNOSIS — Z9989 Dependence on other enabling machines and devices: Secondary | ICD-10-CM | POA: Diagnosis not present

## 2018-04-26 NOTE — Patient Instructions (Signed)
CPAP compliance 70% greater than 4 hours, 30% less than 4 hours for total compliance of 100% for 30 days Continue same settings F/U 8 months for repeat compliance

## 2018-07-22 DIAGNOSIS — H9211 Otorrhea, right ear: Secondary | ICD-10-CM | POA: Insufficient documentation

## 2018-07-22 DIAGNOSIS — H9193 Unspecified hearing loss, bilateral: Secondary | ICD-10-CM | POA: Insufficient documentation

## 2018-08-22 ENCOUNTER — Ambulatory Visit: Payer: 59 | Admitting: Podiatry

## 2018-08-30 ENCOUNTER — Telehealth: Payer: Self-pay

## 2018-08-30 NOTE — Telephone Encounter (Signed)
Phone screening complete 

## 2018-08-31 ENCOUNTER — Encounter: Payer: Self-pay | Admitting: Internal Medicine

## 2018-08-31 ENCOUNTER — Ambulatory Visit (INDEPENDENT_AMBULATORY_CARE_PROVIDER_SITE_OTHER): Payer: 59 | Admitting: Internal Medicine

## 2018-08-31 VITALS — Ht 67.0 in | Wt 172.0 lb

## 2018-08-31 DIAGNOSIS — R197 Diarrhea, unspecified: Secondary | ICD-10-CM | POA: Diagnosis not present

## 2018-08-31 DIAGNOSIS — R1013 Epigastric pain: Secondary | ICD-10-CM

## 2018-08-31 DIAGNOSIS — Z1211 Encounter for screening for malignant neoplasm of colon: Secondary | ICD-10-CM

## 2018-08-31 DIAGNOSIS — R634 Abnormal weight loss: Secondary | ICD-10-CM | POA: Diagnosis not present

## 2018-08-31 NOTE — Patient Instructions (Signed)
1.  LABORATORIES including CBC, comprehensive metabolic panel, lipase.  Please come by the basement of our building at your convenience between 7:30am and 4:30am.  We will contact youwith the results when available  2.  You have been scheduled for an abdominal ultrasound at [facility] (1st floor of hospital) on 09/05/2018 at 9:30am. Please arrive 15 minutes prior to your appointment for registration. Make certain not to have anything to eat or drink after midnight  prior to your appointment. Should you need to reschedule your appointment, please contact radiology at [phone]. This test typically takes about 30 minutes to perform.  3.  SCHEDULE COLONOSCOPY (screening and recurrent diarrhea) and EGD (recurrent epigastric pain) in [place].   I will call you when Dr. Blanch Media September schedule comes out and we can schedule this.

## 2018-08-31 NOTE — Progress Notes (Signed)
HISTORY OF PRESENT ILLNESS:  Jason Munoz is a 63 y.o. male with coronary artery disease, hypertension, hyperlipidemia, and prior history of chronic tobacco abuse.  He schedules this WebEx audio and visual telehealth medicine visit during the coronavirus pandemic regarding recurrent problems with severe epigastric pain, diarrhea, and prostration.  I last saw the patient in this office Jul 05, 2017 regarding radicular pain.  He confirmed to me that this was indeed secondary to his back after subsequent formal evaluation.  He does have a history of GERD for which he takes Nexium daily.  Generally no breakthrough symptoms.  He describes today in the early portion of this year problems with severe epigastric pain lasting 5 days and associated with diarrhea.  He was extremely fatigued and felt the need to spend time in bed.  No nausea or vomiting.  No bleeding.  He felt that he probably had a gastroenteritis (this was before the pandemic) and eventually recovered without sequelae.  However, proximally 2 weeks ago he experienced similar problems.  Again severe focal epigastric pain without radiation, nausea, or vomiting.  Several days of diarrhea.  Decreased energy and decreased appetite with several pound weight loss.  He was tested for COVID-19 last week and this returned negative.  He is feeling somewhat better at this time.  He is concerned about his recurrent symptoms.  His last colonoscopy was about 10 years ago.  He is interested in his follow-up colonoscopy as well.  Last CT scan April 2019- for acute abnormalities.  Blood work from last year unremarkable.  Patient tells me that he quit smoking recently after retiring  REVIEW OF SYSTEMS:  All non-GI ROS negative unless otherwise stated in the HPI except for back pain  Past Medical History:  Diagnosis Date  . Colitis   . Coronary artery disease 12/06   stent LAD in 2006  . GERD (gastroesophageal reflux disease)   . HTN (hypertension)   .  Hypercholesteremia   . Snoring     Past Surgical History:  Procedure Laterality Date  . APPENDECTOMY    . bilateral ankle fractures    . CARPAL TUNNEL RELEASE    . ELBOW SURGERY Right   . EXTERNAL EAR SURGERY    . heart stint    . NECK SURGERY      Social History Jason QuillSteven L Ferrington  reports that he has never smoked. He has never used smokeless tobacco. He reports that he does not drink alcohol or use drugs.  family history includes COPD in his father.  No Known Allergies     PHYSICAL EXAMINATION: No acute distress alert and oriented.  Cooperative No additional physical examination information with telehealth visit   ASSESSMENT:  1.  Recurrent episodes of persistent epigastric pain with diarrhea, fatigue, and weight loss.  Etiology unclear.  Rule out pancreaticobiliary disorders, eosinophilic gastroenteritis, ulcer disease. 2.  GERD.  Seemingly controlled with daily Nexium 3.  Colonoscopy 2005 and early 2011- for neoplasia.  Normal exams.  About due for routine follow-up  PLAN:  1.  LABORATORIES including CBC, comprehensive metabolic panel, lipase.  We will contact patient with the results when available 2.  SCHEDULE abdominal ultrasound "recurrent epigastric pain".  We will contact patient with results when available 3.  SCHEDULE COLONOSCOPY (screening and recurrent diarrhea) and EGD (recurrent epigastric pain) in [facility].The nature of the procedure, as well as the risks, benefits, and alternatives were carefully and thoroughly reviewed with the patient. Ample time for discussion and questions allowed. The  patient understood, was satisfied, and agreed to proceed. This WebEx audiovisual telehealth medicine visit was initiated by and consented for by the patient.  He was in his home and I was in my office during the encounter.  He understands it may be associated professional charge for this service which totaled 25 minutes

## 2018-08-31 NOTE — Addendum Note (Signed)
Addended by: Audrea Muscat on: 08/31/2018 02:48 PM   Modules accepted: Orders

## 2018-09-05 ENCOUNTER — Ambulatory Visit (HOSPITAL_COMMUNITY)
Admission: RE | Admit: 2018-09-05 | Discharge: 2018-09-05 | Disposition: A | Discharge status: Home / Self Care | Payer: 59 | Source: Ambulatory Visit | Attending: Internal Medicine | Admitting: Internal Medicine

## 2018-09-05 ENCOUNTER — Other Ambulatory Visit: Payer: Self-pay

## 2018-09-05 ENCOUNTER — Other Ambulatory Visit (INDEPENDENT_AMBULATORY_CARE_PROVIDER_SITE_OTHER): Payer: 59

## 2018-09-05 DIAGNOSIS — R634 Abnormal weight loss: Secondary | ICD-10-CM

## 2018-09-05 DIAGNOSIS — Z1211 Encounter for screening for malignant neoplasm of colon: Secondary | ICD-10-CM | POA: Diagnosis not present

## 2018-09-05 DIAGNOSIS — R197 Diarrhea, unspecified: Secondary | ICD-10-CM

## 2018-09-05 DIAGNOSIS — R1013 Epigastric pain: Secondary | ICD-10-CM | POA: Diagnosis not present

## 2018-09-05 LAB — COMPREHENSIVE METABOLIC PANEL
ALT: 10 U/L (ref 0–53)
AST: 10 U/L (ref 0–37)
Albumin: 4.1 g/dL (ref 3.5–5.2)
Alkaline Phosphatase: 88 U/L (ref 39–117)
BUN: 12 mg/dL (ref 6–23)
CO2: 26 mEq/L (ref 19–32)
Calcium: 9.1 mg/dL (ref 8.4–10.5)
Chloride: 103 mEq/L (ref 96–112)
Creatinine, Ser: 1.25 mg/dL (ref 0.40–1.50)
GFR: 58.66 mL/min — ABNORMAL LOW (ref >60.00)
Glucose, Bld: 106 mg/dL — ABNORMAL HIGH (ref 70–99)
Potassium: 3.7 mEq/L (ref 3.5–5.1)
Sodium: 140 mEq/L (ref 135–145)
Total Bilirubin: 1.2 mg/dL (ref 0.2–1.2)
Total Protein: 6.7 g/dL (ref 6.0–8.3)

## 2018-09-05 LAB — CBC WITH DIFFERENTIAL/PLATELET
Basophils Absolute: 0.1 10*3/uL (ref 0.0–0.1)
Basophils Relative: 0.6 % (ref 0.0–3.0)
Eosinophils Absolute: 0.1 10*3/uL (ref 0.0–0.7)
Eosinophils Relative: 0.7 % (ref 0.0–5.0)
HCT: 44.8 % (ref 39.0–52.0)
Hemoglobin: 15.1 g/dL (ref 13.0–17.0)
Lymphocytes Relative: 13.2 % (ref 12.0–46.0)
Lymphs Abs: 1.1 10*3/uL (ref 0.7–4.0)
MCHC: 33.7 g/dL (ref 30.0–36.0)
MCV: 90 fl (ref 78.0–100.0)
Monocytes Absolute: 0.6 10*3/uL (ref 0.1–1.0)
Monocytes Relative: 8 % (ref 3.0–12.0)
Neutro Abs: 6.2 10*3/uL (ref 1.4–7.7)
Neutrophils Relative %: 77.5 % — ABNORMAL HIGH (ref 43.0–77.0)
Platelets: 234 10*3/uL (ref 150.0–400.0)
RBC: 4.98 Mil/uL (ref 4.22–5.81)
RDW: 15.7 % — ABNORMAL HIGH (ref 11.5–15.5)
WBC: 8 10*3/uL (ref 4.0–10.5)

## 2018-09-05 LAB — LIPASE: Lipase: 28 U/L (ref 11.0–59.0)

## 2018-09-07 ENCOUNTER — Other Ambulatory Visit: Payer: Self-pay

## 2018-09-07 ENCOUNTER — Ambulatory Visit (AMBULATORY_SURGERY_CENTER): Payer: Self-pay | Admitting: *Deleted

## 2018-09-07 VITALS — Ht 67.0 in | Wt 174.0 lb

## 2018-09-07 DIAGNOSIS — R197 Diarrhea, unspecified: Secondary | ICD-10-CM

## 2018-09-07 DIAGNOSIS — R1013 Epigastric pain: Secondary | ICD-10-CM

## 2018-09-07 MED ORDER — SUPREP BOWEL PREP KIT 17.5-3.13-1.6 GM/177ML PO SOLN: 1.0000 | Freq: Once | ORAL | 0 refills | Status: AC

## 2018-09-07 NOTE — Progress Notes (Signed)
Previsit via telephone. ID per name,dob and address  No egg or soy allergy known to patient  No issues with past sedation with any surgeries  or procedures, no intubation problems  No diet pills per patient No home 02 use per patient  No blood thinners per patient  Pt denies issues with constipation  No A fib or A flutter  EMMI information,consent, acknowledgement form(patient to return at time of procedure), instructions and Suprep coupon

## 2018-09-12 ENCOUNTER — Encounter: Payer: Self-pay | Admitting: Podiatry

## 2018-09-12 ENCOUNTER — Other Ambulatory Visit: Payer: Self-pay

## 2018-09-12 ENCOUNTER — Ambulatory Visit (INDEPENDENT_AMBULATORY_CARE_PROVIDER_SITE_OTHER): Payer: 59

## 2018-09-12 ENCOUNTER — Ambulatory Visit: Payer: 59 | Admitting: Podiatry

## 2018-09-12 VITALS — Temp 97.6°F

## 2018-09-12 DIAGNOSIS — M7661 Achilles tendinitis, right leg: Secondary | ICD-10-CM | POA: Diagnosis not present

## 2018-09-12 DIAGNOSIS — M779 Enthesopathy, unspecified: Secondary | ICD-10-CM

## 2018-09-12 DIAGNOSIS — M722 Plantar fascial fibromatosis: Secondary | ICD-10-CM | POA: Diagnosis not present

## 2018-09-13 ENCOUNTER — Telehealth: Payer: Self-pay | Admitting: Internal Medicine

## 2018-09-13 NOTE — Progress Notes (Signed)
Subjective:   Patient ID: Jason Munoz, male   DOB: 63 y.o.   MRN: [id number]   HPI Patient presents stating that he started developed a lot of pain in his right ankle and he knows he is probably getting need new orthotics as it is been a while since he has had   ROS      Objective:  Physical Exam  Neurovascular status intact with patient found to have inflammation pain sinus tarsi right with inflammation fluid around the sinus tarsi with no range of motion loss but some splinting currently in     Assessment:   capsulitis of the sinus tarsi right     Plan:  Today I did a sterile prep injected sinus tarsi right 3 mg Kenalog 5 mg Xylocaine and advised on range of motion exercises discussed orthotics and reappoint again in 4 weeks  X-rays indicate that there is no signs of joint loss of motion or indications of arthritis with patient most likely be needing orthotics 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

## 2018-09-13 NOTE — Telephone Encounter (Signed)

## 2018-09-14 ENCOUNTER — Ambulatory Visit (AMBULATORY_SURGERY_CENTER): Payer: 59 | Admitting: Internal Medicine

## 2018-09-14 ENCOUNTER — Encounter: Payer: Self-pay | Admitting: Internal Medicine

## 2018-09-14 ENCOUNTER — Other Ambulatory Visit: Payer: Self-pay

## 2018-09-14 VITALS — BP 116/81 | HR 53 | Temp 97.8°F | Resp 10 | Ht 67.0 in | Wt 174.0 lb

## 2018-09-14 DIAGNOSIS — K219 Gastro-esophageal reflux disease without esophagitis: Secondary | ICD-10-CM

## 2018-09-14 DIAGNOSIS — Z1211 Encounter for screening for malignant neoplasm of colon: Secondary | ICD-10-CM | POA: Diagnosis not present

## 2018-09-14 DIAGNOSIS — R634 Abnormal weight loss: Secondary | ICD-10-CM | POA: Diagnosis not present

## 2018-09-14 DIAGNOSIS — R1013 Epigastric pain: Secondary | ICD-10-CM

## 2018-09-14 DIAGNOSIS — R197 Diarrhea, unspecified: Secondary | ICD-10-CM

## 2018-09-14 MED ORDER — SODIUM CHLORIDE 0.9 % IV SOLN: 500.0000 mL | Freq: Once | INTRAVENOUS | Status: DC

## 2018-09-14 NOTE — Op Note (Signed)
Jason Munoz Patient Name: Jason Munoz Procedure Date: 09/14/2018 9:52 AM MRN: [id number] Endoscopist: Docia Chuck. Henrene Pastor , MD Age: 63 Referring MD:  Date of Birth: Aug 01, 1955 Gender: Male Account #: [id number] Procedure:                Colonoscopy Indications:              Colon cancer screening. Average risk. Negative                            previous examinations 2005 and 2011 Medicines:                Monitored Anesthesia Care Procedure:                Pre-Anesthesia Assessment:                           - Prior to the procedure, a History and Physical                            was performed, and patient medications and                            allergies were reviewed. The patient's tolerance of                            previous anesthesia was also reviewed. The risks                            and benefits of the procedure and the sedation                            options and risks were discussed with the patient.                            All questions were answered, and informed consent                            was obtained. Prior Anticoagulants: The patient has                            taken no previous anticoagulant or antiplatelet                            agents. ASA Grade Assessment: II - A patient with                            mild systemic disease. After reviewing the risks                            and benefits, the patient was deemed in                            satisfactory condition to undergo the procedure.  After obtaining informed consent, the colonoscope                            was passed under direct vision. Throughout the                            procedure, the patient's blood pressure, pulse, and                            oxygen saturations were monitored continuously. The                            Colonoscope was introduced through the anus and                            advanced to the the cecum,  identified by                            appendiceal orifice and ileocecal valve. The                            terminal ileum, ileocecal valve, appendiceal                            orifice, and rectum were photographed. The quality                            of the bowel preparation was excellent. The                            colonoscopy was performed without difficulty. The                            patient tolerated the procedure well. The bowel                            preparation used was SUPREP via split dose                            instruction. Scope In: 10:02:54 AM Scope Out: 10:15:21 AM Scope Withdrawal Time: 0 hours 10 minutes 50 seconds  Total Procedure Duration: 0 hours 12 minutes 27 seconds  Findings:                 The terminal ileum appeared normal.                           Multiple diverticula were found in the sigmoid                            colon. Internal hemorrhoids present                           The exam was otherwise without abnormality on  direct and retroflexion views. Complications:            No immediate complications. Estimated blood loss:                            None. Estimated Blood Loss:     Estimated blood loss: none. Impression:               - The examined portion of the ileum was normal.                           - Diverticulosis in the sigmoid colon. Internal                            hemorrhoids.                           - The examination was otherwise normal on direct                            and retroflexion views.                           - No specimens collected. Recommendation:           - Repeat colonoscopy in 10 years for screening                            purposes.                           - Patient has a contact number available for                            emergencies. The signs and symptoms of potential                            delayed complications were discussed with the                             patient. Return to normal activities tomorrow.                            Written discharge instructions were provided to the                            patient.                           - Resume previous diet.                           - Continue present medications.                           - EGD today. Please see report regarding findings  and final recommendations Jason BonitoJohn N. Marina GoodellPerry, MD 09/14/2018 10:29:23 AM This report has been signed electronically.

## 2018-09-14 NOTE — Op Note (Signed)
[place] Patient Name: Jason Munoz Procedure Date: 09/14/2018 9:51 AM MRN: [id number] Endoscopist: Docia Chuck. Henrene Pastor , MD Age: 63 Referring MD:  Date of Birth: [birth year] Gender: Male Account #: [id number] Procedure:                Upper GI endoscopy Indications:              Epigastric abdominal pain, Weight loss. Known GERD.                            On PPI Medicines:                Monitored Anesthesia Care Procedure:                Pre-Anesthesia Assessment:                           - Prior to the procedure, a History and Physical                            was performed, and patient medications and                            allergies were reviewed. The patient's tolerance of                            previous anesthesia was also reviewed. The risks                            and benefits of the procedure and the sedation                            options and risks were discussed with the patient.                            All questions were answered, and informed consent                            was obtained. Prior Anticoagulants: The patient has                            taken no previous anticoagulant or antiplatelet                            agents. ASA Grade Assessment: II - A patient with                            mild systemic disease. After reviewing the risks                            and benefits, the patient was deemed in                            satisfactory condition to undergo the procedure.  After obtaining informed consent, the endoscope was                            passed under direct vision. Throughout the                            procedure, the patient's blood pressure, pulse, and                            oxygen saturations were monitored continuously. The                            Endoscope was introduced through the mouth, and                            advanced to the second part of duodenum. The upper                             GI endoscopy was accomplished without difficulty.                            The patient tolerated the procedure well. Scope In: Scope Out: Findings:                 The esophagus was normal.                           The stomach was normal.                           The examined duodenum was normal.                           The cardia and gastric fundus were normal on                            retroflexion. Complications:            No immediate complications. Estimated Blood Loss:     Estimated blood loss: none. Impression:               1. Normal EGD                           2. GERD. Recommendation:           1. SCHEDULE contrast-enhanced CT scan of the                            abdomen and pelvis. "Persistent mid abdominal pain                            and weight loss, rule out occult lesion"                           2. Continue current medications  3. Resume previous diet                           4. Follow-up to be determined after the results of                            the CT scan are available. Wilhemina BonitoJohn N. Marina GoodellPerry, MD 09/14/2018 10:33:11 AM This report has been signed electronically.

## 2018-09-14 NOTE — Patient Instructions (Signed)
Diverticulosis noted. Read GERD handout. Dr Blanch Media office will call you to schedule CT. Contrast and blank instructions given today.     YOU HAD AN ENDOSCOPIC PROCEDURE TODAY AT [facility]:   Refer to the procedure report that was given to you for any specific questions about what was found during the examination.  If the procedure report does not answer your questions, please call your gastroenterologist to clarify.  If you requested that your care partner not be given the details of your procedure findings, then the procedure report has been included in a sealed envelope for you to review at your convenience later.  YOU SHOULD EXPECT: Some feelings of bloating in the abdomen. Passage of more gas than usual.  Walking can help get rid of the air that was put into your GI tract during the procedure and reduce the bloating. If you had a lower endoscopy (such as a colonoscopy or flexible sigmoidoscopy) you may notice spotting of blood in your stool or on the toilet paper. If you underwent a bowel prep for your procedure, you may not have a normal bowel movement for a few days.  Please Note:  You might notice some irritation and congestion in your nose or some drainage.  This is from the oxygen used during your procedure.  There is no need for concern and it should clear up in a day or so.  SYMPTOMS TO REPORT IMMEDIATELY:   Following lower endoscopy (colonoscopy or flexible sigmoidoscopy):  Excessive amounts of blood in the stool  Significant tenderness or worsening of abdominal pains  Swelling of the abdomen that is new, acute  Fever of 100F or higher   Following upper endoscopy (EGD)  Vomiting of blood or coffee ground material  New chest pain or pain under the shoulder blades  Painful or persistently difficult swallowing  New shortness of breath  Fever of 100F or higher  Black, tarry-looking stools  For urgent or emergent issues, a gastroenterologist can be reached  at any hour by calling [phone].   DIET:  We do recommend a small meal at first, but then you may proceed to your regular diet.  Drink plenty of fluids but you should avoid alcoholic beverages for 24 hours.  ACTIVITY:  You should plan to take it easy for the rest of today and you should NOT DRIVE or use heavy machinery until tomorrow (because of the sedation medicines used during the test).    FOLLOW UP: Our staff will call the number listed on your records 48-72 hours following your procedure to check on you and address any questions or concerns that you may have regarding the information given to you following your procedure. If we do not reach you, we will leave a message.  We will attempt to reach you two times.  During this call, we will ask if you have developed any symptoms of COVID 19. If you develop any symptoms (ie: fever, flu-like symptoms, shortness of breath, cough etc.) before then, please call [phone].  If you test positive for Covid 19 in the 2 weeks post procedure, please call and report this information to [place].    If any biopsies were taken you will be contacted by phone or by letter within the next 1-3 weeks.  Please call us at [phone] if you have not heard about the biopsies in 3 weeks.    SIGNATURES/CONFIDENTIALITY: You and/or your care partner have signed paperwork which will be entered into your electronic  medical record.  These signatures attest to the fact that that the information above on your After Visit Summary has been reviewed and is understood.  Full responsibility of the confidentiality of this discharge information lies with you and/or your care-partner.

## 2018-09-14 NOTE — Progress Notes (Signed)
Jason Munoz  Vital signs-courtney [employer]

## 2018-09-14 NOTE — Progress Notes (Signed)
Report to PACU, RN, vss, BBS= Clear.  

## 2018-09-15 ENCOUNTER — Other Ambulatory Visit: Payer: Self-pay

## 2018-09-15 ENCOUNTER — Telehealth: Payer: Self-pay

## 2018-09-15 DIAGNOSIS — R634 Abnormal weight loss: Secondary | ICD-10-CM

## 2018-09-15 DIAGNOSIS — R1013 Epigastric pain: Secondary | ICD-10-CM

## 2018-09-15 NOTE — Telephone Encounter (Signed)
Pt scheduled for CT of A/P at [facility] 10/07/18@2pm , pt to arrive there at 1:45pm. Pt to be NPO after 10am except he will drink bottle 1 of contrast at 12noon and bottle 2 at 1pm. Pt aware of appt.

## 2018-09-16 ENCOUNTER — Telehealth: Payer: Self-pay

## 2018-09-16 NOTE — Telephone Encounter (Signed)
  Follow up Call-  Call back number 09/14/2018  Post procedure Call Back phone  # [phone]  Permission to leave phone message Yes  Some recent data might be hidden     Patient questions:  Do you have a fever, pain , or abdominal swelling? No. Pain Score  0 *  Have you tolerated food without any problems? Yes.    Have you been able to return to your normal activities? Yes.    Do you have any questions about your discharge instructions: Diet   No. Medications  No. Follow up visit  No.  Do you have questions or concerns about your Care? No.  Actions: * If pain score is 4 or above: No action needed, pain <4.  Have you developed a fever since your procedure? no 2.   Have you had an respiratory symptoms (SOB or cough) since your procedure? no  3.   Have you tested positive for COVID 19 since your procedure no  4.   Have you had any family members/close contacts diagnosed with the COVID 19 since your procedure?  no  If yes to any of these questions please route to Joylene John, RN and Alphonsa Gin, Therapist, sports.

## 2018-09-17 DIAGNOSIS — I714 Abdominal aortic aneurysm, without rupture, unspecified: Secondary | ICD-10-CM

## 2018-09-17 HISTORY — DX: Abdominal aortic aneurysm, without rupture: I71.4

## 2018-09-17 HISTORY — DX: Abdominal aortic aneurysm, without rupture, unspecified: I71.40

## 2018-09-19 ENCOUNTER — Ambulatory Visit: Payer: Self-pay | Admitting: Cardiology

## 2018-09-26 ENCOUNTER — Ambulatory Visit: Payer: 59 | Admitting: Podiatry

## 2018-09-28 ENCOUNTER — Ambulatory Visit: Payer: 59 | Admitting: Podiatry

## 2018-09-30 ENCOUNTER — Telehealth: Payer: Self-pay | Admitting: Cardiology

## 2018-09-30 NOTE — Telephone Encounter (Signed)
Spoke with pt, last Saturday the patient fell against a golf cart and since then he has had pain under his breast. It goes around from side to side. It occurs at rest, with taking a deep breath and also with movement. Advised patient it does not sound like heart pain. He also reports SOB that comes and goes, occurs with little exertion. He denies swelling or orthopnea. He was recently diagnosed with AAA and wants to discuss that. His bp is 113/80. Patient requested and appointment to be seen. Follow up scheduled with app.

## 2018-09-30 NOTE — Telephone Encounter (Signed)
New Message    Pt c/o of Chest Pain: STAT if CP now or developed within 24 hours  1. Are you having CP right now? No, chest pain occurred last Saturday   2. Are you experiencing any other symptoms (ex. SOB, nausea, vomiting, sweating)? Sob, on and off since last Saturday   3. How long have you been experiencing CP?  6 days  4. Is your CP continuous or coming and going? Coming and going   5. Have you taken Nitroglycerin? No   Patients wife states that he had an ultrasound at Dr. Henrene Pastor office and he had an aorta of the abdomen   ?

## 2018-10-07 ENCOUNTER — Ambulatory Visit (INDEPENDENT_AMBULATORY_CARE_PROVIDER_SITE_OTHER)
Admission: RE | Admit: 2018-10-07 | Discharge: 2018-10-07 | Disposition: A | Discharge status: Home / Self Care | Payer: 59 | Source: Ambulatory Visit | Attending: Internal Medicine | Admitting: Internal Medicine

## 2018-10-07 ENCOUNTER — Other Ambulatory Visit: Payer: Self-pay

## 2018-10-07 DIAGNOSIS — R634 Abnormal weight loss: Secondary | ICD-10-CM

## 2018-10-07 DIAGNOSIS — R1013 Epigastric pain: Secondary | ICD-10-CM

## 2018-10-07 MED ORDER — IOHEXOL 300 MG/ML  SOLN
100.0000 mL | Freq: Once | INTRAMUSCULAR | Status: AC | PRN
  Administered 2018-10-07: 100 mL via INTRAVENOUS

## 2018-10-11 NOTE — Progress Notes (Signed)
Cardiology Office Note   Date:  10/12/2018   ID:  Jason Munoz, DOB 1955-06-15, MRN [id number]  PCP:  Burnard Bunting, MD  Cardiologist:  Peter Martinique, MD EP: None  CC: chest pain and shortness of breath    History of Present Illness: Jason Munoz is a 63 y.o. male with a PMH of CAD s/p PCI with BMS to LAD in 2006, HLD, OSA, GERD, and tobacco abuse, who presents for the evaluation of chest pain and SOB.  He was last evaluated by cardiology at an outpatient visit with Dr. Martinique 03/2018, at which time he was doing well from a cardiac standpoint. No medication changes occurred and he was recommended to follow-up in 1 year. His last ischemic evaluation was a NST in 2014 which was negative.   He called our office 09/30/2018 with complaints of pain under his breasts after falling against a golf cart. Pain was worse with deep breathing and movement with intermittent SOB. He subsequently went to the ED 10/03/2018 with the same complaints where he was found to have non-displaced fractures of his left lateral 7th/8th ribs. He was given antiinflammatories and narcotics to help with pain, as well as an incentive spirometer.   He presents today for further evaluation and states that he is feeling better.  He states he took 2 days of pain medication for his rib fractures and then discontinued use of the medication.  His blood pressure is elevated today however, he is only taking half a tablet of his Hyzaar medication.  He denies chest pain, increased shortness of breath, lower extremity edema, fatigue, palpitations, melena, hematuria, hemoptysis, diaphoresis, weakness, presyncope, syncope, orthopnea, and PND.  Past Medical History:  Diagnosis Date  . Allergy   . Anxiety   . Arthritis   . Colitis   . Coronary artery disease 12/06   stent LAD in 2006  . GERD (gastroesophageal reflux disease)   . HTN (hypertension)   . Hypercholesteremia   . Myocardial infarction ([place])   . Sleep apnea    uses cpap  . Snoring   . Substance abuse [facility])     Past Surgical History:  Procedure Laterality Date  . APPENDECTOMY    . bilateral ankle fractures    . CARPAL TUNNEL RELEASE    . COLONOSCOPY    . ELBOW SURGERY Right   . EXTERNAL EAR SURGERY    . heart stint    . NECK SURGERY       Current Outpatient Medications  Medication Sig Dispense Refill  . aspirin 81 MG chewable tablet Chew by mouth daily.    Marland Kitchen atorvastatin (LIPITOR) 20 MG tablet Take 20 mg by mouth daily.    . celecoxib (CELEBREX) 200 MG capsule     . escitalopram (LEXAPRO) 10 MG tablet     . esomeprazole (NEXIUM) 40 MG packet Take 40 mg by mouth daily before breakfast. 30 each 12  . fluticasone (FLONASE) 50 MCG/ACT nasal spray     . losartan-hydrochlorothiazide (HYZAAR) 100-25 MG tablet Take 1 tablet by mouth daily.    . metoprolol succinate (TOPROL-XL) 25 MG 24 hr tablet Take 25 mg by mouth daily.      . Multiple Vitamins-Minerals (MULTIVITAMIN PO) Take 1 tablet by mouth daily.    Marland Kitchen NICORETTE 4 MG gum     . nitroGLYCERIN (NITROSTAT) 0.4 MG SL tablet     . valACYclovir (VALTREX) 500 MG tablet daily.      No current facility-administered medications for  this visit.     Allergies:   Patient has no known allergies.    Social History:  The patient  reports that he quit smoking about 22 years ago. His smoking use included cigarettes. He smoked 1.00 pack per day. He has quit using smokeless tobacco. He reports that he does not drink alcohol or use drugs.   Family History:  The patient's family history includes COPD in his father.    ROS:  Please see the history of present illness.   Otherwise, review of systems are positive for none.   All other systems are reviewed and negative.    PHYSICAL EXAM: VS:  BP (!) 179/95   Pulse (!) 54   Temp (!) 97 F (36.1 C)   Ht 5\' 7"  (1.702 m)   Wt 181 lb 3.2 oz (82.2 kg)   BMI 28.38 kg/m  , BMI Body mass index is 28.38 kg/m. GEN: Well nourished, well developed, in no acute  distress HEENT: normal Neck: no JVD, carotid bruits, or masses Cardiac:RRR; no murmurs, rubs, or gallops,no edema  Respiratory:  clear to auscultation bilaterally, normal work of breathing GI: soft, nontender, nondistended, + BS MS: no deformity or atrophy Skin: warm and dry, no rash Neuro:  Strength and sensation are intact Psych: euthymic mood, full affect   EKG:  EKG is ordered today.  Sinus bradycardia 54 bpm, no acute changes.    Recent Labs: 09/05/2018: ALT 10; BUN 12; Creatinine, Ser 1.25; Hemoglobin 15.1; Platelets 234.0; Potassium 3.7; Sodium 140    Lipid Panel No results found for: CHOL, TRIG, HDL, CHOLHDL, VLDL, LDLCALC, LDLDIRECT    Wt Readings from Last 3 Encounters:  10/12/18 181 lb 3.2 oz (82.2 kg)  09/14/18 174 lb (78.9 kg)  09/07/18 174 lb (78.9 kg)      Other studies Reviewed: Additional studies/ records that were reviewed today include:   EKG 04/04/2018 Normal sinus rhythm 61 bpm   ASSESSMENT AND PLAN:  1. Recent chest pain 2/2 rib fractures: occurred after a mechanical fall into a golf cart. Improving with NSAIDs and norco.  Continue to monitor  2. CAD s/p PCI to LAD in 2006: no recent exertional angina.  - Continue aspirin 81 mg tablet daily Continue metoprolol succinate 25 mg daily Continue sublingual nitroglycerin 0.4 mg tablet as needed Low-sodium heart healthy diet Increase physical activity as tolerated  3. Abdominal aortic aneurysm: noted to have a 3.4cm aneurysm on abdominal US 08/2018, as well as CT A/P 10/07/2018.  - Will continue routine monitoring - recommended for repeat abdominal US in 3 years (2023).   4. HTN: BP 179/95 today - Continue metoprolol succinate 25 mg daily Increase from half a tablet to full tablet losartan-HCTZ 100-25 mg tablet daily Low-sodium heart healthy diet Increase physical activity as tolerated Keep blood pressure log and bring to next visit  5. HLD:- LDL 88 01/24/2018  Continue atorvastatin 20 mg tablet  daily Heart healthy low-sodium diet Increase physical activity as tolerated Monitored by PCP  Current medicines are reviewed at length with the patient today.  The patient does not have concerns regarding medicines.  The following changes have been made: Hyzaar increased to 100-25 mg tablet daily.  Labs/ tests ordered today include: none today No orders of the defined types were placed in this encounter.    Disposition:   FU with APP in 1 month  Signed, Ronney AstersJesse M , NP-C  10/12/2018 11:41 AM

## 2018-10-12 ENCOUNTER — Ambulatory Visit: Payer: 59 | Admitting: General Practice

## 2018-10-12 ENCOUNTER — Other Ambulatory Visit: Payer: Self-pay

## 2018-10-12 ENCOUNTER — Encounter: Payer: Self-pay | Admitting: Medical

## 2018-10-12 VITALS — BP 179/95 | HR 54 | Temp 97.0°F | Ht 67.0 in | Wt 181.2 lb

## 2018-10-12 DIAGNOSIS — I2510 Atherosclerotic heart disease of native coronary artery without angina pectoris: Secondary | ICD-10-CM | POA: Diagnosis not present

## 2018-10-12 DIAGNOSIS — E7800 Pure hypercholesterolemia, unspecified: Secondary | ICD-10-CM | POA: Diagnosis not present

## 2018-10-12 DIAGNOSIS — I714 Abdominal aortic aneurysm, without rupture, unspecified: Secondary | ICD-10-CM

## 2018-10-12 DIAGNOSIS — S2242XD Multiple fractures of ribs, left side, subsequent encounter for fracture with routine healing: Secondary | ICD-10-CM

## 2018-10-12 DIAGNOSIS — I10 Essential (primary) hypertension: Secondary | ICD-10-CM | POA: Diagnosis not present

## 2018-10-12 NOTE — Patient Instructions (Signed)
Medication Instructions:  Take 1 full tablet of losartan-HCTZ daily.  Take Metoprolol in the evenings.  If you need a refill on your cardiac medications before your next appointment, please call your pharmacy.    Follow-Up: At [facility], you and your health needs are our priority.  As part of our continuing mission to provide you with exceptional heart care, we have created designated Provider Care Teams.  These Care Teams include your primary Cardiologist (physician) and Advanced Practice Providers (APPs -  Physician Assistants and Nurse Practitioners) who all work together to provide you with the care you need, when you need it. . Please schedule a 1 month f/u appt with an APP.

## 2018-11-14 ENCOUNTER — Ambulatory Visit: Payer: 59 | Admitting: Cardiology

## 2018-12-27 ENCOUNTER — Ambulatory Visit: Payer: 59 | Admitting: Neurology

## 2019-01-02 NOTE — Progress Notes (Signed)
01/02/2019 Jason Munoz [id number] 1955-10-25   History of Present Illness: Jason Munoz is a 63 year old male with a past medical history of anxiety, hypertension, CAD, stent x 1 2006, AAA, obstructive sleep apnea uses Cpap, left rib fracture secondary to a mechanical fall 09/2018 and GERD.  Current smoker.  He presents today for further evaluation regarding recurrent episodes of nausea without vomiting, epigastric pain which radiates into his chest and diarrhea.  The first episode occurred March 2020.  No specific food or stress triggers. He developed upper chest pain, epigastric pain with nausea and explosive nonbloody diarrhea.  His appetite was decreased during this time.  His energy level was significantly diminished.  These symptoms lasted for 1-1/2 weeks.  The second episode occurred July 2020. He reported having severe epigastric pain, diarrhea, and felt wiped out for 5 days.  He was evaluated by Dr. Henrene Pastor 08/31/2018.  An abdominal ultrasound 09/05/2018 showed a normal gallbladder and liver.  A 3.4 cm abdominal aortic aneurysm was identified.  He underwent an EGD 09/14/2018 which was normal.  Colonoscopy was done on the same date which identified a normal terminal ileum, diverticulosis in the sigmoid colon and internal hemorrhoids.  He was advised to repeat a colonoscopy in 10 years.  An abdominal/pelvic CT 10/07/2018 identified sigmoid diverticulosis without evidence of diverticulitis, abdominal aortic aneurysm 3.4 cm, aortic atherosclerosis, chronic sacroiliitis and a small umbilical hernia containing adipose tissue.  No explanation for his episodic abdominal pain, diarrhea and weight loss was identified.  A third episode of nausea, epigastric pain which radiated into the chest with diarrhea occurred approximately 2 weeks ago.  He reported passing bloody diarrhea with this episode which was new.  He did not pass any bloody diarrhea with the other 2 episodes.  He presented to [facility]  in [place] on 12/23/2018.  Laboratory studies showed a WBC 8.9.  Hemoglobin 15.1.  Hematocrit 44.5.  Platelet 260.  Sodium 135.  Potassium 4.1.  Glucose 100.  BUN 12.  Creatinine 1.26.  Calcium 9.5.  Alk phos 119.  Total bili 0.84.  Albumin 4.0.  ALT 11.  AST 14.  Lipase 21.  Troponin less than 0.010.  Urinalysis was normal.  A chest/abdominal/pelvic CT angiogram did not show any evidence of a dissecting aortic aneurysm.  Inflammatory wall thickening with surrounding stranding involving the duodenum possibly representing duodenitis or inflammation was noted.  No evidence of perforation.  Diverticulosis without evidence of diverticulitis.  A fatty liver was noted as well. He was prescribed Carafate 1 g p.o. 4 times daily and he was discharged home. Currently, he denies having any dysphagia, heartburn, upper or lower abdominal pain.  No nausea.  He takes aspirin 81 mg once daily.  He takes Celebrex 200 mg 1 tab for 5 days monthly for arthritis pain.  No recent antibiotics.  He drinks 6 beers weekly or less.  History of hypertension, however, his blood pressure has been running low.  He stopped taking Losartan Hydrochlorothiazide 2 weeks ago.  He has lost 6 pounds since February 2020.  No fever, sweats or chills.  His father had a history of gallbladder disease.  No family history of upper or lower GI cancer.  Wife is present.   EGD 09/14/2018 by Dr. Henrene Pastor: - The esophagus was normal. - The stomach was normal. - The examined duodenum was normal. - The cardia and gastric fundus were normal on retroflexion.  Colonoscopy 09/14/2018 by Dr. Henrene Pastor: - The examined portion  of the ileum was normal. - Diverticulosis in the sigmoid colon. Internal hemorrhoids. - The examination was otherwise normal on direct and retroflexion views. - No specimens collected. -Recall colonoscopy 10 years.  Abdominal/pelvic CT with contrast 10/07/2018: 1. A specific cause for the patient's episodic abdominal pain and weight loss  is not identified. 2. Sigmoid colon diverticulosis. 3. Fusiform infrarenal abdominal aortic aneurysm 3.4 cm in diameter. Recommend followup by ultrasound in 3 years. 4.  Aortic Atherosclerosis 5. Bilateral chronic sacroiliitis. 6. Small umbilical hernia contains adipose tissue. 7. Fluid density partially exophytic 4.4 by 4.0 cm hypodense lesion of the right kidney upper pole compatible with cyst. This previously measured 3.6 by 3.1 cm  Abdominal sonogram 09/05/2018:  3.4 cm abdominal aortic aneurysm. Recommend followup by ultrasound in 3 years. 4 cm benign-appearing right upper pole renal cyst. Normal gallbladder and liver. No acute findings.   Current Medications, Allergies, Past Medical History, Past Surgical History, Family History and Social History were reviewed in [employer] record.  Physical Exam: BP (!) 106/58    Pulse 76    Temp (!) 97 F (36.1 C)    Ht '5\' 7"'  (1.702 m)    Wt 181 lb 12.8 oz (82.5 kg)    SpO2 98%    BMI 28.47 kg/m   General: Well developed  63 year old male in no acute distress Head: Normocephalic and atraumatic Eyes:  sclerae anicteric, conjunctiva pink  Ears: Normal auditory acuity Mouth: Dentition intact, no ulcers or lesions Lungs: Clear throughout to auscultation Heart: Regular rate and rhythm Abdomen: Soft, non tender and non distended. No masses, no hepatomegaly. Normal bowel sounds x 4 quads. RLQ scar.  Rectal: Deferred. Musculoskeletal: Symmetrical with no gross deformities  Extremities: No edema  Neurological: Alert oriented x 4, grossly nonfocal Psychological:  Alert and cooperative. Normal mood and affect  Assessment and Recommendations:   2. 63 year old male with episodic nausea, epigastric pain/chest pain and diarrhea (bloody diarrhea with his most recent attack).  Abd/pelvic CTA 11/6 showed inflammatory wall thickening with surrounding stranding involving the duodenum possibly representing duodenitis.  No  evidence of ischemic colitis.  Normal EGD 09/14/2018. Colonoscopy 09/14/2018 showed diverticulosis, no colitis or polyps.  No evidence of carcinoid. -CCK HIDA -Patient to call office with next episode of abdominal/chest pain, will order CBC, CMP, lipase, CRP, C1 esterase inhibitor and C4 levels at that time -H. pylori breath test -Avoid taking any NSAID including Celebrex -Follow-up in the office with Dr. Henrene Pastor 2 to 3 months  2.  Hypertension.  Patient previously on Losartan Hydrochlorothiazide which was discontinued 2 weeks ago. It will be interesting to see if the patient has any further episodes of nausea, epigastric pain while he is off Losartan.  It is possible he is having episodes of abdominal angioedema secondary to ARB.  Less likely ischemic colitis as CT angiogram and colonoscopy were negative.  3. CAD, stent 2006.  He was last seen by his cardiologist Dr. Martinique 03/2018 -Recommended patient to follow-up with his cardiologist   4. AAA measured 3.4cm on abdominal US 08/2018 and CT A/P 10/07/2018. Under surveillance, repeat abd sono 2023 planned.

## 2019-01-03 ENCOUNTER — Ambulatory Visit: Payer: 59 | Admitting: Nurse Practitioner

## 2019-01-03 ENCOUNTER — Encounter: Payer: Self-pay | Admitting: Nurse Practitioner

## 2019-01-03 VITALS — BP 106/58 | HR 76 | Temp 97.0°F | Ht 67.0 in | Wt 181.8 lb

## 2019-01-03 DIAGNOSIS — R079 Chest pain, unspecified: Secondary | ICD-10-CM

## 2019-01-03 DIAGNOSIS — R1013 Epigastric pain: Secondary | ICD-10-CM

## 2019-01-03 DIAGNOSIS — R110 Nausea: Secondary | ICD-10-CM | POA: Diagnosis not present

## 2019-01-03 DIAGNOSIS — R197 Diarrhea, unspecified: Secondary | ICD-10-CM

## 2019-01-03 NOTE — Patient Instructions (Signed)
If you are age 61 or older, your body mass index should be between 23-30. Your Body mass index is 28.47 kg/m. If this is out of the aforementioned range listed, please consider follow up with your Primary Care Provider.  If you are age 30 or younger, your body mass index should be between 19-25. Your Body mass index is 28.47 kg/m. If this is out of the aformentioned range listed, please consider follow up with your Primary Care Provider.   Today your blood pressure was low. Please follow up with your Primary Care Provider for blood pressure management.  Call our office if you develop chest pain, nausea or diarrhea.  Follow up with Dr Henrene Pastor in 2-3 months.    You have been scheduled for a HIDA scan at [facility] (1st floor) on 01-17-2019. Please arrive 15 minutes prior to your scheduled appointment at  [id number]. Make certain not to have anything to eat or drink at least 6 hours prior to your test. Should this appointment date or time not work well for you, please call radiology scheduling at [phone].  _____________________________________________________________________ hepatobiliary (HIDA) scan is an imaging procedure used to diagnose problems in the liver, gallbladder and bile ducts. In the HIDA scan, a radioactive chemical or tracer is injected into a vein in your arm. The tracer is handled by the liver like bile. Bile is a fluid produced and excreted by your liver that helps your digestive system break down fats in the foods you eat. Bile is stored in your gallbladder and the gallbladder releases the bile when you eat a meal. A special nuclear medicine scanner (gamma camera) tracks the flow of the tracer from your liver into your gallbladder and small intestine.  During your HIDA scan  You'll be asked to change into a hospital gown before your HIDA scan begins. Your health care team will position you on a table, usually on your back. The radioactive tracer is then injected into a vein  in your arm.The tracer travels through your bloodstream to your liver, where it's taken up by the bile-producing cells. The radioactive tracer travels with the bile from your liver into your gallbladder and through your bile ducts to your small intestine.You may feel some pressure while the radioactive tracer is injected into your vein. As you lie on the table, a special gamma camera is positioned over your abdomen taking pictures of the tracer as it moves through your body. The gamma camera takes pictures continually for about an hour. You'll need to keep still during the HIDA scan. This can become uncomfortable, but you may find that you can lessen the discomfort by taking deep breaths and thinking about other things. Tell your health care team if you're uncomfortable. The radiologist will watch on a computer the progress of the radioactive tracer through your body. The HIDA scan may be stopped when the radioactive tracer is seen in the gallbladder and enters your small intestine. This typically takes about an hour. In some cases extra imaging will be performed if original images aren't satisfactory, if morphine is given to help visualize the gallbladder or if the medication CCK is given to look at the contraction of the gallbladder. This test typically takes 2 hours to complete. ________________________________________________________________________ .

## 2019-01-04 ENCOUNTER — Encounter: Payer: Self-pay | Admitting: *Deleted

## 2019-01-04 ENCOUNTER — Telehealth: Payer: Self-pay

## 2019-01-04 DIAGNOSIS — R110 Nausea: Secondary | ICD-10-CM

## 2019-01-04 DIAGNOSIS — R1013 Epigastric pain: Secondary | ICD-10-CM

## 2019-01-04 NOTE — Progress Notes (Signed)
Excellent assessment and plan reviewed 

## 2019-01-04 NOTE — Telephone Encounter (Signed)
-----   Message from Noralyn Pick, NP sent at 01/04/2019  1:07 PM EST ----- Lesly Rubenstein, Please order a H. Pylori breath test for this patient. I called the pt and left a msg on his voicemail that I have ordered this test. Thx

## 2019-01-04 NOTE — Telephone Encounter (Signed)
Called and spoke with patient-patient informed of need for H.Pylori breath test to be completed at [facility] of [address]patient is aware of need to pick up paperwork from [place] office to take with him for the breath test-order for h.pylori breath test entered into Epic per patient recommendation; Patient advised to call back to the office at [phone] should questions/concerns arise;  Patient verbalized understanding of information/instructions;

## 2019-01-16 ENCOUNTER — Telehealth: Payer: Self-pay | Admitting: Nurse Practitioner

## 2019-01-16 NOTE — Telephone Encounter (Signed)
Pt is scheduled for a HIDA scan tomorrow. He is also scheduled for lab work right after that. He wants to know if dye from HIDA test would interfere with his lab work. He stated that he called [facility] to ask that, after waiting for 30 minutes the person that picked up the phone referred him back to [place]. He also wants to know if he needs to continue fasting between HIDA and blood work. Pls call him.

## 2019-01-16 NOTE — Telephone Encounter (Signed)
Called and spoke with patient- patient is requesting to know if he can have the H. Pylori breath test after the HIDA scan- patient informed that the H. Pylori was not a blood test-patient advised to proceed with HIDA scan and then the H. Pylori breath test- patient was advised to not eat/drink anything prior to the H. Pylori test; Patient advised to call back to the office at [phone] should questions/concerns arise;  Patient verbalized understanding of information/instructions;

## 2019-01-17 ENCOUNTER — Encounter (HOSPITAL_COMMUNITY)
Admission: RE | Admit: 2019-01-17 | Discharge: 2019-01-17 | Disposition: A | Discharge status: Home / Self Care | Payer: 59 | Source: Ambulatory Visit | Attending: Nurse Practitioner | Admitting: Nurse Practitioner

## 2019-01-17 ENCOUNTER — Encounter: Payer: Self-pay | Admitting: Nurse Practitioner

## 2019-01-17 ENCOUNTER — Other Ambulatory Visit: Payer: Self-pay

## 2019-01-17 DIAGNOSIS — R197 Diarrhea, unspecified: Secondary | ICD-10-CM | POA: Diagnosis present

## 2019-01-17 DIAGNOSIS — R1013 Epigastric pain: Secondary | ICD-10-CM

## 2019-01-17 DIAGNOSIS — R110 Nausea: Secondary | ICD-10-CM | POA: Insufficient documentation

## 2019-01-17 DIAGNOSIS — R079 Chest pain, unspecified: Secondary | ICD-10-CM | POA: Insufficient documentation

## 2019-01-17 MED ORDER — TECHNETIUM TC 99M MEBROFENIN IV KIT
4.8700 | PACK | Freq: Once | INTRAVENOUS | Status: AC | PRN
  Administered 2019-01-17: 4.87 via INTRAVENOUS

## 2019-01-24 ENCOUNTER — Telehealth: Payer: Self-pay | Admitting: Nurse Practitioner

## 2019-01-24 NOTE — Telephone Encounter (Signed)
Please contact the patient and inform him his H. pylori breath was negative thank you

## 2019-01-24 NOTE — Telephone Encounter (Signed)
Called and spoke with patient-patient informed of result note;  Patient verbalized understanding of information/instructions;  Patient was advised to call the office at [phone] if questions/concerns arise;

## 2019-02-13 ENCOUNTER — Ambulatory Visit: Payer: 59 | Admitting: Neurology

## 2019-02-13 ENCOUNTER — Other Ambulatory Visit: Payer: Self-pay

## 2019-02-13 ENCOUNTER — Encounter: Payer: Self-pay | Admitting: Neurology

## 2019-02-13 VITALS — BP 159/95 | HR 62 | Temp 97.9°F | Ht 67.0 in | Wt 189.0 lb

## 2019-02-13 DIAGNOSIS — G4739 Other sleep apnea: Secondary | ICD-10-CM

## 2019-02-13 DIAGNOSIS — I714 Abdominal aortic aneurysm, without rupture, unspecified: Secondary | ICD-10-CM

## 2019-02-13 DIAGNOSIS — G4710 Hypersomnia, unspecified: Secondary | ICD-10-CM

## 2019-02-13 DIAGNOSIS — G4733 Obstructive sleep apnea (adult) (pediatric): Secondary | ICD-10-CM

## 2019-02-13 DIAGNOSIS — Z9989 Dependence on other enabling machines and devices: Secondary | ICD-10-CM

## 2019-02-13 DIAGNOSIS — G4730 Sleep apnea, unspecified: Secondary | ICD-10-CM

## 2019-02-13 DIAGNOSIS — G4731 Primary central sleep apnea: Secondary | ICD-10-CM

## 2019-02-13 NOTE — Patient Instructions (Signed)
Patient has central apneas under CPAP of 11 cm , he needs to return for a full night titration.  Please use BiPAP if central apneas arise in CPAP at or over 15 cm water. Patient uses a nasal pillow.

## 2019-02-13 NOTE — Progress Notes (Signed)
SLEEP MEDICINE CLINIC   Provider:  Melvyn Novasarmen  , M D  Primary Care Physician:  Geoffry ParadiseAronson, Richard, MD   Referring Provider: Geoffry ParadiseAronson, Richard, MD   Chief Complaint  Patient presents with  . Follow-up    pt alone, rm 10. pt states that things are well. DME [employer]    02-13-2019-  Jason Munoz is a 63 y.o. male , was seen for multiple Rv by Sherril CroonNp Martin. He had undergone a sleep study on 11-20-2016 and [facility] with mostly obstructive , [facility], RDI 9.3/h -  REM AHI 16.5/h. 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  I reviewed the patient's download and also he has used the machine on every also last 30 days he has not always been able to use it for longer than 2 hours, the average user time is 2 hours and 54 minutes on the, he uses CPAP with a set pressure of 11 cmH2O and 3 cm EPR, his residual AHI is 19.3 almost double the apnea count he had a baseline, and these are central dominant apneas.  My suspicion is that the patient has treatment emergent central apnea and needs to be titrated to a BiPAP, air leakage seems not to be the problem. He is using a nasal pillow, which he likes.  In summer he broke 3 ribs , had to sleep in a recliner, for 3 weeks in september he went out of town without CPAP, has abdominal pain, and was less and less compliant.  He went with his wife to [place] before christmas.    2018- seen here as in a referral/ revisit  from Dr. Jacky KindleAronson for sleep consultation. [name] reports tinnitus, fatigue, snoring, erectile dysfunction and a history of high cholesterol. He has also been treated for GERD. Hypertension has been treated with Toprol and Cozaar. He takes a baby aspirin daily. He smokes one pack per day drinks socially alcohol but does not use nonprescription drugs or caffeine. 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 His 12 hour shift will begin at 4 AM and an's at 4:30 PM, other  days he will start at 8 AM to 8 PM. He still feels that he gets nighttime sleep and rest.  Sleep habits are as follows:  Usually the patient will come home after work to eat supper with his spouse, he may do some gardening or lawnmowing. And evening as usually spend watching TV. The patient had neck surgery, his wife hip surgery and there not participating in a regular exercise regimen at this time. Bedtime is usually around 10:30 during the week. The patient reports falling asleep rather promptly, he sleeps on his side, on one pillow. The bedroom is quiet, cool and dark with a ceiling fan. He has up to 3 bathroom breaks each night to go to the bathroom and he wakes up frequently for other reasons 2. He has hip pain, he wakes up and goes back to sleep but he wakes up frequently enough to sleep is fragmented. He rises at the morning at about 3 AM to go to his early shift. He feels that he gets his best sleep just before he has to rise at 3 AM. For this reason he will shift to the 8 to 8 shift only. Currently he rises at 6:50 AM and off this way gets about 5-6 hours of sleep for sure. Most mornings he will be  refreshed and restored when waking up. He does not wake with headaches, nausea dizziness chest pain or shortness of breath.  Sleep medical history and family sleep history:  One sister, father wears CPAP. No history of sleep walking or night terrors.   Social history: newlywed, lives with spouse, patient has grown children from a previous marriage.  Review of Systems: Out of a complete 14 system review, the patient complains of only the following symptoms, and all other reviewed systems are negative. Snoring,  Dysuria, insomnia, no longer shift work.  Abdominal pain , AAA -  2.5 cm  Poor controlled hypertension  Gastritis, colitis ,   How likely are you to doze in the following situations: 0 = not likely, 1 = slight chance, 2 = moderate chance, 3 = high chance  Sitting and Reading? Watching  Television? Sitting inactive in a public place (theater or meeting)? Lying down in the afternoon when circumstances permit? Sitting and talking to someone? Sitting quietly after lunch without alcohol? In a car, while stopped for a few minutes in traffic? As a passenger in a car for an hour without a break?  Total =11    Epworth Sleepiness  score was 10 before CPAP(!), Fatigue severity score : 21    , depression score  3/ 15    Social History   Socioeconomic History  . Marital status: Married    Spouse name: Not on file  . Number of children: 3  . Years of education: HS  . Highest education level: Not on file  Occupational History  . Occupation: retired     Associate Professor: [employer]  Tobacco Use  . Smoking status: Former Smoker    Packs/day: 1.00    Types: Cigarettes    Quit date: 05/19/1996    Years since quitting: 22.7  . Smokeless tobacco: Former Engineer, water and Sexual Activity  . Alcohol use: Never  . Drug use: No  . Sexual activity: Yes    Birth control/protection: None  Other Topics Concern  . Not on file  Social History Narrative   Lives at home with his wife.   Right-handed.   No caffeine use.   Social Determinants of Health   Financial Resource Strain:   . Difficulty of Paying Living Expenses: Not on file  Food Insecurity:   . Worried About Programme researcher, broadcasting/film/video in the Last Year: Not on file  . Ran Out of Food in the Last Year: Not on file  Transportation Needs:   . Lack of Transportation (Medical): Not on file  . Lack of Transportation (Non-Medical): Not on file  Physical Activity:   . Days of Exercise per Week: Not on file  . Minutes of Exercise per Session: Not on file  Stress:   . Feeling of Stress : Not on file  Social Connections:   . Frequency of Communication with Friends and Family: Not on file  . Frequency of Social Gatherings with Friends and Family: Not on file  . Attends Religious Services: Not on file  . Active Member of Clubs or  Organizations: Not on file  . Attends Banker Meetings: Not on file  . Marital Status: Not on file  Intimate Partner Violence:   . Fear of Current or Ex-Partner: Not on file  . Emotionally Abused: Not on file  . Physically Abused: Not on file  . Sexually Abused: Not on file    Family History  Problem Relation Age of Onset  . COPD  Father   . Stomach cancer Neg Hx   . Rectal cancer Neg Hx   . Esophageal cancer Neg Hx   . Colon cancer Neg Hx   . Colon polyps Neg Hx     Past Medical History:  Diagnosis Date  . AAA (abdominal aortic aneurysm) ([place]) 09/2018   3.4cm  . Allergy   . Anxiety   . Arthritis   . Colitis   . Coronary artery disease 12/06   stent LAD in 2006  . GERD (gastroesophageal reflux disease)   . HTN (hypertension)   . Hypercholesteremia   . Myocardial infarction ([place])   . Sleep apnea    uses cpap  . Snoring   . Substance abuse ([place])   . Umbilical hernia     Past Surgical History:  Procedure Laterality Date  . 2006    . APPENDECTOMY    . bilateral ankle fractures    . CARPAL TUNNEL RELEASE    . COLONOSCOPY    . ELBOW SURGERY Right   . EXTERNAL EAR SURGERY    . NECK SURGERY    . UPPER GASTROINTESTINAL ENDOSCOPY      Current Outpatient Medications  Medication Sig Dispense Refill  . aspirin 81 MG chewable tablet Chew by mouth daily.    Marland Kitchen atorvastatin (LIPITOR) 20 MG tablet Take 20 mg by mouth daily.    . celecoxib (CELEBREX) 200 MG capsule     . escitalopram (LEXAPRO) 10 MG tablet     . esomeprazole (NEXIUM) 40 MG packet Take 40 mg by mouth daily before breakfast. 30 each 12  . fluticasone (FLONASE) 50 MCG/ACT nasal spray     . losartan-hydrochlorothiazide (HYZAAR) 100-25 MG tablet Take 1 tablet by mouth daily.    . metoprolol succinate (TOPROL-XL) 25 MG 24 hr tablet Take 25 mg by mouth daily.      . Multiple Vitamins-Minerals (MULTIVITAMIN PO) Take 1 tablet by mouth daily.    Marland Kitchen NICORETTE 4 MG gum     . nitroGLYCERIN (NITROSTAT) 0.4  MG SL tablet     . sucralfate (CARAFATE) 1 g tablet Take 1 g by mouth 4 (four) times daily.    . valACYclovir (VALTREX) 500 MG tablet daily.      No current facility-administered medications for this visit.    Allergies as of 02/13/2019  . (No Known Allergies)    Vitals: BP (!) 159/95   Pulse 62   Temp 97.9 F (36.6 C)   Ht 5\' 7"  (1.702 m)   Wt 189 lb (85.7 kg)   BMI 29.60 kg/m  Last Weight:  Wt Readings from Last 1 Encounters:  02/13/19 189 lb (85.7 kg)   JSE:GBTD mass index is 29.6 kg/m.     Last Height:   Ht Readings from Last 1 Encounters:  02/13/19 5\' 7"  (1.702 m)    Physical exam:  General: The patient is awake, alert and appears not in acute distress. The patient is well groomed. Head: Normocephalic, atraumatic. Neck is supple. Mallampati 3,  neck circumference: 16. 5 . Nasal airflow patent , TMJ is evident . Retrognathia is seen.  Cardiovascular:  Regular rate and rhythm , without  murmurs or carotid bruit, and without distended neck veins. Respiratory: Lungs are clear to auscultation. Skin:  Without evidence of edema, or rash Trunk: BMI is 29. The patient's posture is erect   Neurologic exam : The patient is awake and alert, oriented to place and time.   Memory subjective described as intact.  Attention  span & concentration ability appears normal.  Speech is fluent,  without  dysarthria, dysphonia or aphasia.  Mood and affect are appropriate. Cranial nerves: Pupils are equal and briskly reactive to light.  No loss of taste or smell.   Funduscopic exam without evidence of pallor or edema. Extraocular movements  in vertical and horizontal planes intact and without nystagmus. Visual fields by finger perimetry are intact. Hearing to finger rub intact. Facial sensation intact .Marland Kitchen Facial motor strength is symmetric , and his tongue and uvula move midline. Shoulder shrug symmetrical.  Motor exam: Normal tone, muscle bulk and symmetric strength in all  extremities. Sensory:  Fine touch, pinprick and vibration were tested in all extremities. Proprioception tested in the upper extremities was normal. Coordination: Rapid alternating movements in the fingers/hands was normal. Finger-to-nose maneuver  normal without evidence of ataxia, dysmetria or tremor. Gait and station: Patient walks without assistive device  Deep tendon reflexes: in the  upper and lower extremities are symmetric and intact.   I was able to review the referral papers for the patient, he had a sleep study prior at [facility] in [place] in 2009. At the time he was diagnosed with apnea "but not severely enough to justify CPAP use?"    Assessment:  After physical and neurologic examination, review of laboratory studies,  Personal review of imaging studies, reports of other /same  Imaging studies, results of polysomnography and / or neurophysiology testing and pre-existing records as far as provided in visit., my assessment is   1) Mr. 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  He feels OK. He has been having abdominal symptoms, was diagnosed with AAA and is worried, but not enough to use CPAP compliantly.   I will  Need to have him come back for a d full night titration, possible change to PAP modalities such as BiPAP and may be changing him form a nasal pillow.   The patient was advised of the nature of the diagnosed disorder , the treatment options and the  risks for general health and wellness arising from not treating the condition.   I spent more than 25  minutes of face to face time with the patient.  Greater than 50% of time was spent in counseling and coordination of care. We have discussed the diagnosis and differential and I answered the patient's questions.    Plan:  Treatment plan and additional workup : PAP attended sleep study.    Melvyn Novas, MD 02/13/2019, 3:58 PM  Certified in Neurology by ABPN Certified in Sleep Medicine by [facility] [address] [place], [place] [address]

## 2019-03-03 ENCOUNTER — Ambulatory Visit: Payer: 59 | Admitting: Internal Medicine

## 2019-03-31 NOTE — Progress Notes (Deleted)
Cardiology Office Note    Date:  03/31/2019   ID:  Jason Munoz, DOB 1955/03/23, MRN [id number]  PCP:  Geoffry Paradise, MD  Cardiologist:   [place], MD    History of Present Illness:  Jason Munoz is a 64 y.o. male seen for follow up CAD. Has known CAD with single vessel CAD with stenting of the proximal LAD in 2006 with a Vision BMS placed. He had a normal stress Myoview in 2007. He had a normal stress Echo in 2012. In 2014 he had a myoview study that showed a fixed inferior defect. No ischemia and normal EF. Other issues include GERD and HLD.   He was  seen 2 years ago for pre op clearance for cervical fusion and was doing well from a cardiac standpoint. He has OSA followed by Dr. Vickey Huger  On  09/30/2018 he called with complaints of pain under his breasts after falling against a golf cart. Pain was worse with deep breathing and movement with intermittent SOB. He subsequently went to the ED 10/03/2018 with the same complaints where he was found to have non-displaced fractures of his left lateral 7th/8th ribs. He was given antiinflammatories and narcotics to help with pain, as well as an incentive spirometer.   On follow up today he is doing very well from a cardiac standpoint. He denies chest pain, dyspnea, palpitations or dizziness. Quit smoking for 5 months then resumed for a month. Now stopped again this week. He walks on a treadmill some. Has a new house and planning on a lot of yard work.  Past Medical History:  Diagnosis Date  . AAA (abdominal aortic aneurysm) (HCC) 09/2018   3.4cm  . Allergy   . Anxiety   . Arthritis   . Colitis   . Coronary artery disease 12/06   stent LAD in 2006  . GERD (gastroesophageal reflux disease)   . HTN (hypertension)   . Hypercholesteremia   . Myocardial infarction (HCC)   . Sleep apnea    uses cpap  . Snoring   . Substance abuse (HCC)   . Umbilical hernia     Past Surgical History:  Procedure Laterality Date  . 2006    .  APPENDECTOMY    . bilateral ankle fractures    . CARPAL TUNNEL RELEASE    . COLONOSCOPY    . ELBOW SURGERY Right   . EXTERNAL EAR SURGERY    . NECK SURGERY    . UPPER GASTROINTESTINAL ENDOSCOPY      Current Medications: Outpatient Medications Prior to Visit  Medication Sig Dispense Refill  . aspirin 81 MG chewable tablet Chew by mouth daily.    Marland Kitchen atorvastatin (LIPITOR) 20 MG tablet Take 20 mg by mouth daily.    . celecoxib (CELEBREX) 200 MG capsule     . escitalopram (LEXAPRO) 10 MG tablet     . esomeprazole (NEXIUM) 40 MG packet Take 40 mg by mouth daily before breakfast. 30 each 12  . fluticasone (FLONASE) 50 MCG/ACT nasal spray     . losartan-hydrochlorothiazide (HYZAAR) 100-25 MG tablet Take 1 tablet by mouth daily.    . metoprolol succinate (TOPROL-XL) 25 MG 24 hr tablet Take 25 mg by mouth daily.      . Multiple Vitamins-Minerals (MULTIVITAMIN PO) Take 1 tablet by mouth daily.    Marland Kitchen NICORETTE 4 MG gum     . nitroGLYCERIN (NITROSTAT) 0.4 MG SL tablet     . sucralfate (CARAFATE) 1 g tablet Take 1  g by mouth 4 (four) times daily.    . valACYclovir (VALTREX) 500 MG tablet daily.      No facility-administered medications prior to visit.     Allergies:   Patient has no known allergies.   Social History   Socioeconomic History  . Marital status: Married    Spouse name: Not on file  . Number of children: 3  . Years of education: HS  . Highest education level: Not on file  Occupational History  . Occupation: retired     Fish farm manager: [employer]  Tobacco Use  . Smoking status: Former Smoker    Packs/day: 1.00    Types: Cigarettes    Quit date: 05/19/1996    Years since quitting: 22.8  . Smokeless tobacco: Former Network engineer and Sexual Activity  . Alcohol use: Never  . Drug use: No  . Sexual activity: Yes    Birth control/protection: None  Other Topics Concern  . Not on file  Social History Narrative   Lives at home with his wife.   Right-handed.   No  caffeine use.   Social Determinants of Health   Financial Resource Strain:   . Difficulty of Paying Living Expenses: Not on file  Food Insecurity:   . Worried About Charity fundraiser in the Last Year: Not on file  . Ran Out of Food in the Last Year: Not on file  Transportation Needs:   . Lack of Transportation (Medical): Not on file  . Lack of Transportation (Non-Medical): Not on file  Physical Activity:   . Days of Exercise per Week: Not on file  . Minutes of Exercise per Session: Not on file  Stress:   . Feeling of Stress : Not on file  Social Connections:   . Frequency of Communication with Friends and Family: Not on file  . Frequency of Social Gatherings with Friends and Family: Not on file  . Attends Religious Services: Not on file  . Active Member of Clubs or Organizations: Not on file  . Attends Archivist Meetings: Not on file  . Marital Status: Not on file     Family History:  The patient's family history includes COPD in his father.   ROS:   Please see the history of present illness.    ROS All other systems reviewed and are negative.   PHYSICAL EXAM:   VS:  There were no vitals taken for this visit.   GENERAL:  Well appearing WM in NAD HEENT:  PERRL, EOMI, sclera are clear. Oropharynx is clear. NECK:  No jugular venous distention, carotid upstroke brisk and symmetric, no bruits, no thyromegaly or adenopathy LUNGS:  Clear to auscultation bilaterally CHEST:  Unremarkable HEART:  RRR,  PMI not displaced or sustained,S1 and S2 within normal limits, no S3, no S4: no clicks, no rubs, no murmurs ABD:  Soft, nontender. BS +, no masses or bruits. No hepatomegaly, no splenomegaly EXT:  2 + pulses throughout, no edema, no cyanosis no clubbing SKIN:  Warm and dry.  No rashes NEURO:  Alert and oriented x 3. Cranial nerves II through XII intact. PSYCH:  Cognitively intact    Wt Readings from Last 3 Encounters:  02/13/19 189 lb (85.7 kg)  01/03/19 181 lb  12.8 oz (82.5 kg)  10/12/18 181 lb 3.2 oz (82.2 kg)      Studies/Labs Reviewed:   EKG:  EKG is ordered today.  The ekg ordered today demonstrates NSR with normal Ecg. Rate 61.  I have personally reviewed and interpreted this study.   Recent Labs: 09/05/2018: ALT 10; BUN 12; Creatinine, Ser 1.25; Hemoglobin 15.1; Platelets 234.0; Potassium 3.7; Sodium 140   Lipid Panel No results found for: CHOL, TRIG, HDL, CHOLHDL, VLDL, LDLCALC, LDLDIRECT  Additional studies/ records that were reviewed today include:   Labs dated 01/03/16: cholesterol 146, triglycerides 114, HDL 61, LDL 62. CMET, CBC, TSH normal. A1c 5.5% Dated 06/15/17: Normal CMET except AST 48, ALT 53.  Dated 01/24/18: cholesterol 159, triglycerides 166, HDL 38, LDL 88. A1c 5.2%. creatinine 1.2. otherwise Chemistries, CBC, TSH normal. Dated 01/30/19: cholesterol 190, triglycerides 107, HDL 40, LDL 129. A1c 5.3%. creatinine 1.2. otherwise CMET, CBC and TSH normal.     Stress Myoview 10/19/15: Cardiology Nuclear Med Study  NARESH ALTHAUS is a 63 y.o. male     MRN : [id number]     DOB: 03-15-1955  Procedure Date: 10/18/2012  Nuclear Med Background Indication for Stress Test:  Evaluation for Ischemia and Stent Patency History:  '06 Stent-LAD; '07 QPR:FFMBWG, EF=74% Cardiac Risk Factors: Hypertension, Lipids and Smoker-Occasional Cigar  Symptoms:  Chest Tightness (last episode of chest discomfort was about a month ago), Fatigue and SOB   Nuclear Pre-Procedure Caffeine/Decaff Intake:  None NPO After: 8:00pm   Lungs:  Clear. O2 Sat: 96% on room air. IV 0.9% NS with Angio Cath:  20g  IV Site: R Hand  IV Started by:  Cathlyn Parsons, RN  Chest Size (in):  42 Cup Size: n/a  Height: 5\' 7"  (1.702 m)  Weight:  171 lb (77.565 kg)  BMI:  Body mass index is 26.78 kg/(m^2). Tech Comments:  No Toprol x 48 hrs   Nuclear Med Study 1 or 2 day study: 1 day  Stress Test Type:  Stress  Reading MD: , MD  Order Authorizing  Provider:   Kristeen Miss, MD  Resting Radionuclide: Technetium 13m Sestamibi  Resting Radionuclide Dose: 11.0 mCi   Stress Radionuclide:  Technetium 12m Sestamibi  Stress Radionuclide Dose: 33.0 mCi           Stress Protocol Rest HR: 78 Stress HR: 146  Rest BP: 132/88 Stress BP: 191/90  Exercise Time (min): 10:00 METS: 11.7   Predicted Max HR: 165 bpm % Max HR: 88.48 bpm Rate Pressure Product: 84m   Dose of Adenosine (mg):  n/a Dose of Lexiscan: n/a mg  Dose of Atropine (mg): n/a Dose of Dobutamine: n/a mcg/kg/min (at max HR)  Stress Test Technologist: [id number], CMA-N  Nuclear Technologist:  Smiley Houseman, CNMT     Rest Procedure:  Myocardial perfusion imaging was performed at rest 45 minutes following the intravenous administration of Technetium 73m Sestamibi.  Rest ECG: NSR - Normal EKG  Stress Procedure:  The patient exercised on the treadmill utilizing the Bruce Protocol for ten minutes. The patient stopped due to fatigue and denied any chest pain.  Technetium 34m Sestamibi was injected at peak exercise and myocardial perfusion imaging was performed after a brief delay.  Stress ECG: No significant change from baseline ECG  QPS Raw Data Images:  Mild diaphragmatic attenuation.  Normal left ventricular size. Stress Images:  There is a medium sized area of moderate attenuation of the mid and basal inferior and mid/basal lateral  wall.  The remaining myocardium has normal uptake.       Rest Images:  There is a medium sized area of moderate attenuation of the mid and basal inferior and mid/basal lateral  wall.  The remaining myocardium has  normal uptake.    Subtraction (SDS):  No evidence of ischemia.  There is a fixed defect that may be due to a previous inferior lateral MI vs diaphragmatic attenuation. Transient Ischemic Dilatation (Normal <1.22):  n/a Lung/Heart Ratio (Normal <0.45):  0.39  Quantitative Gated Spect Images QGS EDV:  90 ml QGS ESV:  32 ml   Impression Exercise Capacity:  Excellent exercise capacity. BP Response:  Normal blood pressure response. Clinical Symptoms:  No significant symptoms noted. ECG Impression:  No significant ST segment change suggestive of ischemia. Comparison with Prior Nuclear Study: The fixed inferior lateral defect is new from the previous Myoview study 01/21/06.  Overall Impression:  Low risk stress nuclear study .  There is a fixed defect in the inferior lateral walls that may be due to a previous inferior lateral MI vs. diaphragmatic attenuation.    The inferior and lateral walls contract normally. .  LV Ejection Fraction: 65%.  LV Wall Motion:  NL LV Function; NL Wall Motion.   Vesta Mixer, Montez Hageman., MD, [facility] 10/18/2012, 4:50 PM Office - [phone] Pager [phone]    ASSESSMENT:    No diagnosis found.   PLAN:  In order of problems listed above:  1. CAD s/p remote stenting of the LAD in 2006 with BMS. Patient is asymptomatic from a cardiac standpoint. We will continue ASA and beta blocker. Stressed importance of smoking cessation.   2. HDL On statin. Last LDL 88. Was 62 in 2017. Work on increasing activity and healthy diet.  3. Tobacco use.  Encourage complete cessation.  I will follow up in one year.    Medication Adjustments/Labs and Tests Ordered: Current medicines are reviewed at length with the patient today.  Concerns regarding medicines are outlined above.  Medication changes, Labs and Tests ordered today are listed in the Patient Instructions below. There are no Patient Instructions on file for this visit.   Signed,  [place], MD  03/31/2019 12:26 PM    [facility] [address], [place], [place], [address] [phone]

## 2019-04-04 ENCOUNTER — Ambulatory Visit: Payer: 59 | Admitting: Cardiology

## 2019-04-05 ENCOUNTER — Telehealth: Payer: Self-pay

## 2019-04-05 NOTE — Telephone Encounter (Signed)
Waiting for patient to receive new insurance card. Once pt calls back with that info, will schedule CPAP study.

## 2019-04-21 NOTE — Progress Notes (Signed)
Cardiology Office Note   Date:  04/24/2019   ID:  Jason Munoz, DOB 1955/12/03, MRN [id number]  PCP:  Burnard Bunting, MD  Cardiologist:   [place], MD EP: None  CC: CAD    History of Present Illness: Jason Munoz is a 64 y.o. male with a PMH of CAD s/p PCI with BMS to LAD in 2006, HLD, OSA, GERD, and tobacco abuse who is seen for follow up  His last ischemic evaluation was a NST in 2014 which was negative.   This past summer he was having a lot of abdominal pain. US showed a AAA and CT was also done. AAA measured 3.4 cm. He stopped taking losartan HCT and said his pain went away. He has no chest pain. He is off and on smoking. Planning to quit again for his birthday. He has gained weight. BP has been well controlled.    Past Medical History:  Diagnosis Date  . AAA (abdominal aortic aneurysm) ([place]) 09/2018   3.4cm  . Allergy   . Anxiety   . Arthritis   . Colitis   . Coronary artery disease 12/06   stent LAD in 2006  . GERD (gastroesophageal reflux disease)   . HTN (hypertension)   . Hypercholesteremia   . Myocardial infarction ([place])   . Sleep apnea    uses cpap  . Snoring   . Substance abuse ([place])   . Umbilical hernia     Past Surgical History:  Procedure Laterality Date  . 2006    . APPENDECTOMY    . bilateral ankle fractures    . CARPAL TUNNEL RELEASE    . COLONOSCOPY    . ELBOW SURGERY Right   . EXTERNAL EAR SURGERY    . NECK SURGERY    . UPPER GASTROINTESTINAL ENDOSCOPY       Current Outpatient Medications  Medication Sig Dispense Refill  . aspirin 81 MG chewable tablet Chew by mouth daily.    Marland Kitchen atorvastatin (LIPITOR) 20 MG tablet Take 20 mg by mouth daily.    . celecoxib (CELEBREX) 200 MG capsule     . escitalopram (LEXAPRO) 10 MG tablet     . esomeprazole (NEXIUM) 40 MG packet Take 40 mg by mouth daily before breakfast. 30 each 12  . fluticasone (FLONASE) 50 MCG/ACT nasal spray     . metoprolol succinate (TOPROL-XL) 25 MG 24 hr tablet  Take 25 mg by mouth daily.      . Multiple Vitamins-Minerals (MULTIVITAMIN PO) Take 1 tablet by mouth daily.    Marland Kitchen NICORETTE 4 MG gum     . nitroGLYCERIN (NITROSTAT) 0.4 MG SL tablet     . valACYclovir (VALTREX) 500 MG tablet daily.      No current facility-administered medications for this visit.    Allergies:   Patient has no known allergies.    Social History:  The patient  reports that he quit smoking about 22 years ago. His smoking use included cigarettes. He smoked 1.00 pack per day. He has quit using smokeless tobacco. He reports that he does not drink alcohol or use drugs.   Family History:  The patient's family history includes COPD in his father.    ROS:  Please see the history of present illness.   Otherwise, review of systems are positive for none.   All other systems are reviewed and negative.    PHYSICAL EXAM: VS:  BP 120/78   Pulse 85   Temp (!) 97.4 F (36.3  C)   Resp (!) 21   Ht 5\' 7"  (1.702 m)   Wt 194 lb 9.6 oz (88.3 kg)   SpO2 99%   BMI 30.48 kg/m  , BMI Body mass index is 30.48 kg/m. GEN: Well nourished, overweight, in no acute distress HEENT: normal Neck: no JVD, carotid bruits, or masses Cardiac:RRR; no murmurs, rubs, or gallops,no edema  Respiratory:  clear to auscultation bilaterally, normal work of breathing GI: soft, nontender, nondistended, + BS MS: no deformity or atrophy Skin: warm and dry, no rash Neuro:  Strength and sensation are intact Psych: euthymic mood, full affect   EKG:  EKG is not ordered today.     Recent Labs: 09/05/2018: ALT 10; BUN 12; Creatinine, Ser 1.25; Hemoglobin 15.1; Platelets 234.0; Potassium 3.7; Sodium 140    Lipid Panel No results found for: CHOL, TRIG, HDL, CHOLHDL, VLDL, LDLCALC, LDLDIRECT   Dated 01/30/19: cholesterol 190, triglycerides 107, HDL 40, LDL 129. A1c 5.3%. creatinine 1.2. otherwise CMET and CBC normal. TSH normal      Wt Readings from Last 3 Encounters:  04/24/19 194 lb 9.6 oz (88.3 kg)    02/13/19 189 lb (85.7 kg)  01/03/19 181 lb 12.8 oz (82.5 kg)      Other studies Reviewed: Additional studies/ records that were reviewed today include:   EKG 04/04/2018 Normal sinus rhythm 61 bpm   ASSESSMENT AND PLAN:  1. CAD s/p PCI to LAD in 2006 with BMS:  - no angina.  - Myoview in 2014 was normal - Continue aspirin 81 mg tablet daily Continue metoprolol succinate 25 mg daily Low-sodium heart healthy diet Increase physical activity as tolerated  2. Abdominal aortic aneurysm: noted to have a 3.4cm aneurysm on abdominal 2015 08/2018, as well as CT A/P 10/07/2018.  - recommend repeat 10/09/2018 in August.   3. HTN: BP well controlled on Toprol only. Losartan HCT stopped due to abdominal pain  4. HLD:- LDL 88 01/24/2018  More recent LDL up to 129 Reports that Dr 14/10/2017 is going to repeat in 6 months. If not at goal < 70 I would recommend increasing lipitor to 80 mg daily.     Current medicines are reviewed at length with the patient today.  The patient does not have concerns regarding medicines.   Labs/ tests ordered today include: none today No orders of the defined types were placed in this encounter.    Disposition:  FU 6 months   Signed,  Jacky Kindle, MD-C  04/24/2019 2:51 PM

## 2019-04-24 ENCOUNTER — Encounter: Payer: Self-pay | Admitting: Cardiology

## 2019-04-24 ENCOUNTER — Other Ambulatory Visit: Payer: Self-pay

## 2019-04-24 ENCOUNTER — Ambulatory Visit: Payer: 59 | Admitting: Cardiology

## 2019-04-24 VITALS — BP 120/78 | HR 85 | Temp 97.4°F | Resp 21 | Ht 67.0 in | Wt 194.6 lb

## 2019-04-24 DIAGNOSIS — Z720 Tobacco use: Secondary | ICD-10-CM

## 2019-04-24 DIAGNOSIS — I714 Abdominal aortic aneurysm, without rupture, unspecified: Secondary | ICD-10-CM

## 2019-04-24 DIAGNOSIS — I2510 Atherosclerotic heart disease of native coronary artery without angina pectoris: Secondary | ICD-10-CM | POA: Diagnosis not present

## 2019-04-24 DIAGNOSIS — I10 Essential (primary) hypertension: Secondary | ICD-10-CM

## 2019-04-24 DIAGNOSIS — E7800 Pure hypercholesterolemia, unspecified: Secondary | ICD-10-CM

## 2019-08-03 ENCOUNTER — Ambulatory Visit
Admission: RE | Admit: 2019-08-03 | Discharge: 2019-08-03 | Disposition: A | Discharge status: Home / Self Care | Payer: 59 | Source: Ambulatory Visit | Attending: Internal Medicine | Admitting: Internal Medicine

## 2019-08-03 ENCOUNTER — Other Ambulatory Visit: Payer: Self-pay | Admitting: Internal Medicine

## 2019-08-03 DIAGNOSIS — R109 Unspecified abdominal pain: Secondary | ICD-10-CM

## 2019-08-03 MED ORDER — IOPAMIDOL (ISOVUE-300) INJECTION 61%
100.0000 mL | Freq: Once | INTRAVENOUS | Status: AC | PRN
  Administered 2019-08-03: 100 mL via INTRAVENOUS

## 2019-08-04 ENCOUNTER — Encounter: Payer: Self-pay | Admitting: Physician Assistant

## 2019-08-04 ENCOUNTER — Telehealth: Payer: Self-pay | Admitting: Cardiology

## 2019-08-04 ENCOUNTER — Ambulatory Visit: Payer: 59 | Admitting: Physician Assistant

## 2019-08-04 ENCOUNTER — Other Ambulatory Visit: Payer: Self-pay

## 2019-08-04 VITALS — BP 125/70 | HR 69 | Temp 97.3°F | Ht 67.0 in | Wt 192.4 lb

## 2019-08-04 DIAGNOSIS — I25118 Atherosclerotic heart disease of native coronary artery with other forms of angina pectoris: Secondary | ICD-10-CM | POA: Diagnosis not present

## 2019-08-04 DIAGNOSIS — R079 Chest pain, unspecified: Secondary | ICD-10-CM | POA: Diagnosis not present

## 2019-08-04 DIAGNOSIS — I714 Abdominal aortic aneurysm, without rupture, unspecified: Secondary | ICD-10-CM

## 2019-08-04 DIAGNOSIS — Z87891 Personal history of nicotine dependence: Secondary | ICD-10-CM

## 2019-08-04 DIAGNOSIS — R0600 Dyspnea, unspecified: Secondary | ICD-10-CM | POA: Diagnosis not present

## 2019-08-04 MED ORDER — NITROGLYCERIN 0.4 MG SL SUBL: 0.4000 mg | SUBLINGUAL_TABLET | SUBLINGUAL | 3 refills | Status: DC | PRN

## 2019-08-04 NOTE — Patient Instructions (Signed)
Medication Instructions:  No Changes *If you need a refill on your cardiac medications before your next appointment, please call your pharmacy*   Lab Work: None If you have labs (blood work) drawn today and your tests are completely normal, you will receive your results only by: Marland Kitchen MyChart Message (if you have MyChart) OR . A paper copy in the mail If you have any lab test that is abnormal or we need to change your treatment, we will call you to review the results.   Testing/Procedures: [address] Your physician has requested that you have an echocardiogram. Echocardiography is a painless test that uses sound waves to create images of your heart. It provides your doctor with information about the size and shape of your heart and how well your heart's chambers and valves are working. This procedure takes approximately one hour. There are no restrictions for this procedure.  Your physician has requested that you have a lexiscan myoview. For further information please visit [email or web address] Please follow instruction sheet, as given.     Follow-Up: At [facility], you and your health needs are our priority.  As part of our continuing mission to provide you with exceptional heart care, we have created designated Provider Care Teams.  These Care Teams include your primary Cardiologist (physician) and Advanced Practice Providers (APPs -  Physician Assistants and Nurse Practitioners) who all work together to provide you with the care you need, when you need it.  Your next appointment:   2 weeks  The format for your next appointment:   In Person  Provider:   Azalee Course PA-C

## 2019-08-04 NOTE — Progress Notes (Signed)
Cardiology Office Note:    Date:  08/07/2019   ID:  Jason Munoz, DOB [birth year], MRN [id number]  PCP:  Burnard Bunting, MD  [facility] Cardiologist:  Peter Martinique, MD  [facility] Electrophysiologist:  None   Referring MD: Burnard Bunting, MD   Chief Complaint  Patient presents with  . Chest Pain    seen for Dr. Martinique    History of Present Illness:    DREY SHAFF is a 64 y.o. male with a hx of CAD, hyperlipidemia, AAA, obstructive sleep apnea, GERD and the tobacco abuse.  He underwent BMS to LAD in 2006.  Nuclear stress test in 2014 was negative.  Ultrasound obtained in 2020 showed AAA measuring at 3.4 cm.  He stopped taking the losartan HCTZ and that his abdominal pain went away.  He was last seen by Dr. Martinique on 04/24/2019 at which time he was doing well.  He had CT abdomen and pelvis with contrast on 08/03/2019, this revealed a stable 3.4 cm infrarenal abdominal aortic aneurysm.  Recommend follow-up ultrasound in 3 years.  Patient presents today along with his wife.  For the past month they have he has been having worsening dyspnea on exertion.  He says he quit smoking in April.  He also has been noticing intermittent tightness across the entire chest.  Symptoms seems to be quite transient when compared to the previous angina in 2006.  He also complained of associated right abdominal discomfort.  He is undergoing evaluation by GI service next week.  The chest discomfort seems to occur more so with exertion.  Overall symptom is concerning, I recommended echocardiogram and a stress test.  He says he tended to run out of breath after doing minimal exertion.  I will try to get the stress test done within the next week to make sure his symptom is noncardiac related.  He also had blood work performed by Dr. Reynaldo Minium yesterday, we will request those blood work.    Past Medical History:  Diagnosis Date  . AAA (abdominal aortic aneurysm) ([place]) 09/2018   3.4cm  . Allergy   .  Anxiety   . Arthritis   . Colitis   . Coronary artery disease 12/06   stent LAD in 2006  . GERD (gastroesophageal reflux disease)   . HTN (hypertension)   . Hypercholesteremia   . Myocardial infarction ([place])   . Sleep apnea    uses cpap  . Snoring   . Substance abuse ([place])   . Umbilical hernia     Past Surgical History:  Procedure Laterality Date  . 2006    . APPENDECTOMY    . bilateral ankle fractures    . CARPAL TUNNEL RELEASE    . COLONOSCOPY    . ELBOW SURGERY Right   . EXTERNAL EAR SURGERY    . NECK SURGERY    . UPPER GASTROINTESTINAL ENDOSCOPY      Current Medications: Current Meds  Medication Sig  . aspirin 81 MG chewable tablet Chew by mouth daily.  Marland Kitchen atorvastatin (LIPITOR) 20 MG tablet Take 20 mg by mouth daily.  . celecoxib (CELEBREX) 200 MG capsule   . escitalopram (LEXAPRO) 10 MG tablet   . esomeprazole (NEXIUM) 40 MG packet Take 40 mg by mouth daily before breakfast.  . fluticasone (FLONASE) 50 MCG/ACT nasal spray   . metoprolol succinate (TOPROL-XL) 25 MG 24 hr tablet Take 25 mg by mouth daily.    . Multiple Vitamins-Minerals (MULTIVITAMIN PO) Take 1 tablet by  mouth daily.  . nitroGLYCERIN (NITROSTAT) 0.4 MG SL tablet Place 1 tablet (0.4 mg total) under the tongue every 5 (five) minutes as needed for chest pain (Up to 3 doses. If chest pain persistent after the 3rd dose, call 911).  . valACYclovir (VALTREX) 500 MG tablet daily.   . [DISCONTINUED] NICORETTE 4 MG gum   . [DISCONTINUED] nitroGLYCERIN (NITROSTAT) 0.4 MG SL tablet      Allergies:   Patient has no known allergies.   Social History   Socioeconomic History  . Marital status: Married    Spouse name: Not on file  . Number of children: 3  . Years of education: HS  . Highest education level: Not on file  Occupational History  . Occupation: retired     Associate Professor: [employer]  Tobacco Use  . Smoking status: Former Smoker    Packs/day: 1.00    Types: Cigarettes    Quit date: 05/19/1996     Years since quitting: 23.2  . Smokeless tobacco: Former Clinical biochemist  . Vaping Use: Former  Substance and Sexual Activity  . Alcohol use: Never  . Drug use: No  . Sexual activity: Yes    Birth control/protection: None  Other Topics Concern  . Not on file  Social History Narrative   Lives at home with his wife.   Right-handed.   No caffeine use.   Social Determinants of Health   Financial Resource Strain:   . Difficulty of Paying Living Expenses:   Food Insecurity:   . Worried About Programme researcher, broadcasting/film/video in the Last Year:   . Barista in the Last Year:   Transportation Needs:   . Freight forwarder (Medical):   Marland Kitchen Lack of Transportation (Non-Medical):   Physical Activity:   . Days of Exercise per Week:   . Minutes of Exercise per Session:   Stress:   . Feeling of Stress :   Social Connections:   . Frequency of Communication with Friends and Family:   . Frequency of Social Gatherings with Friends and Family:   . Attends Religious Services:   . Active Member of Clubs or Organizations:   . Attends Banker Meetings:   Marland Kitchen Marital Status:      Family History: The patient's family history includes COPD in his father. There is no history of Stomach cancer, Rectal cancer, Esophageal cancer, Colon cancer, or Colon polyps.  ROS:   Please see the history of present illness.     All other systems reviewed and are negative.  EKGs/Labs/Other Studies Reviewed:    The following studies were reviewed today:  Cath 01/21/2005  ANGIOGRAPHIC DATA:  The left coronary artery arises and distributes  normally.  There is mild irregularity at the ostium of the left main  coronary less than 20%.   The left anterior descending artery is a very large branch which extends  around the apex. There is severe high-grade stenosis in the proximal vessel  up to 90-95%. The diagonal branch is relatively small and without  significant disease.   The left circumflex  coronary gives rise to a single large marginal vessel  and then terminates in the AV groove.  The marginal vessel has somewhat  diffuse plaque up to 30%.   The right coronary arises and distributes normally.  It is a dominant  vessel.  It has scattered plaque throughout the proximal mid vessel up to  30%.   Left ventricular angiography was performed  in the RAO view.  This  demonstrates normal left ventricular size and contractility with normal  systolic function.  Ejection fraction is estimated 60%.  There is no mitral  regurgitation or prolapse.  FINAL INTERPRETATION:  1.  Single-vessel obstructive atherosclerotic coronary disease.  2.  Normal left ventricular function.  3.  Successful intracoronary stenting of the proximal left anterior      descending.   EKG:  EKG is ordered today.  The ekg ordered today demonstrates normal sinus rhythm without significant ST-T wave changes  Recent Labs: 09/05/2018: ALT 10; BUN 12; Creatinine, Ser 1.25; Hemoglobin 15.1; Platelets 234.0; Potassium 3.7; Sodium 140  Recent Lipid Panel No results found for: CHOL, TRIG, HDL, CHOLHDL, VLDL, LDLCALC, LDLDIRECT  Physical Exam:    VS:  BP 125/70   Pulse 69   Temp (!) 97.3 F (36.3 C)   Ht 5\' 7"  (1.702 m)   Wt 192 lb 6.4 oz (87.3 kg)   SpO2 96%   BMI 30.13 kg/m     Wt Readings from Last 3 Encounters:  08/04/19 192 lb 6.4 oz (87.3 kg)  04/24/19 194 lb 9.6 oz (88.3 kg)  02/13/19 189 lb (85.7 kg)     GEN:  Well nourished, well developed in no acute distress HEENT: Normal NECK: No JVD; No carotid bruits LYMPHATICS: No lymphadenopathy CARDIAC: RRR, no murmurs, rubs, gallops RESPIRATORY:  Clear to auscultation without rales, wheezing or rhonchi  ABDOMEN: Soft, non-tender, non-distended MUSCULOSKELETAL:  No edema; No deformity  SKIN: Warm and dry NEUROLOGIC:  Alert and oriented x 3 PSYCHIATRIC:  Normal affect   ASSESSMENT:    1. Chest pain of uncertain etiology   2. Dyspnea,  unspecified type   3. Coronary artery disease involving native coronary artery of native heart with other form of angina pectoris (HCC)   4. AAA (abdominal aortic aneurysm) without rupture (HCC)   5. Former tobacco use    PLAN:    In order of problems listed above:  1. Chest pain: Symptom appears to be transient when compared to the previous angina.  However given noticeable progression of dyspnea and transient chest discomfort, I am highly concerned that this is cardiac in nature.  I recommended initial study using echocardiogram and Myoview.  I will try to get these tests completed in the next week.  If abnormal, he will need a cardiac catheterization  2. Dyspnea: He has been smoking for many years and just recently quit in April, I recommend a repeat echocardiogram to make sure his dyspnea is not cardiac in nature  3. CAD: Previous cardiac catheterization and stent placement was in 2006.  See cath report above.  Continue aspirin and statin  4. AAA: Ultrasound study last year showed AAA measuring 3.4 cm.  This is unchanged when compared to repeat CT abdomen/pelvis yesterday.  Per radiologist recommendation, follow-up by ultrasound in 3 years.  5. Former tobacco use: Quit in April.  I congratulated him on this.   Medication Adjustments/Labs and Tests Ordered: Current medicines are reviewed at length with the patient today.  Concerns regarding medicines are outlined above.  Orders Placed This Encounter  Procedures  . MYOCARDIAL PERFUSION IMAGING  . EKG 12-Lead  . ECHOCARDIOGRAM COMPLETE   Meds ordered this encounter  Medications  . nitroGLYCERIN (NITROSTAT) 0.4 MG SL tablet    Sig: Place 1 tablet (0.4 mg total) under the tongue every 5 (five) minutes as needed for chest pain (Up to 3 doses. If chest pain persistent after the 3rd  dose, call 911).    Dispense:  25 tablet    Refill:  3    Order Specific Question:   Supervising Provider    Answer:   Lewayne Bunting [[id number]]     Patient Instructions  Medication Instructions:  No Changes *If you need a refill on your cardiac medications before your next appointment, please call your pharmacy*   Lab Work: None If you have labs (blood work) drawn today and your tests are completely normal, you will receive your results only by: Marland Kitchen MyChart Message (if you have MyChart) OR . A paper copy in the mail If you have any lab test that is abnormal or we need to change your treatment, we will call you to review the results.   Testing/Procedures: [address], [address] Your physician has requested that you have an echocardiogram. Echocardiography is a painless test that uses sound waves to create images of your heart. It provides your doctor with information about the size and shape of your heart and how well your heart's chambers and valves are working. This procedure takes approximately one hour. There are no restrictions for this procedure.  Your physician has requested that you have a lexiscan myoview. For further information please visit [email or web address] Please follow instruction sheet, as given.     Follow-Up: At [facility], you and your health needs are our priority.  As part of our continuing mission to provide you with exceptional heart care, we have created designated Provider Care Teams.  These Care Teams include your primary Cardiologist (physician) and Advanced Practice Providers (APPs -  Physician Assistants and Nurse Practitioners) who all work together to provide you with the care you need, when you need it.  Your next appointment:   2 weeks  The format for your next appointment:   In Person  Provider:   Azalee Course PA-C        Signed, Azalee Course, [place]  08/07/2019 12:06 AM    [facility]

## 2019-08-04 NOTE — Telephone Encounter (Signed)
Pt c/o Shortness Of Breath: STAT if SOB developed within the last 24 hours or pt is noticeably SOB on the phone  1. Are you currently SOB (can you hear that pt is SOB on the phone)? No  2. How long have you been experiencing SOB? Around a month/month and a half  3. Are you SOB when sitting or when up moving around? Moving around  4. Are you currently experiencing any other symptoms? Difficulty breathing; disoriented; nausea; lightheadedness; dizziness; sleeping a lot; no energy;

## 2019-08-04 NOTE — Telephone Encounter (Signed)
Received a call from patient.He stated he has been sob with exertion for the past 2 months.Stated is getting worse.He has occasional chest pain.He has noticed he is tiring easy,no energy.Appointment scheduled with Azalee Course PA this afternoon 6/18 at 3:45 pm.

## 2019-08-07 ENCOUNTER — Encounter: Payer: Self-pay | Admitting: Physician Assistant

## 2019-08-09 ENCOUNTER — Ambulatory Visit: Payer: 59 | Admitting: Nurse Practitioner

## 2019-08-09 ENCOUNTER — Encounter: Payer: Self-pay | Admitting: Nurse Practitioner

## 2019-08-09 VITALS — BP 110/78 | HR 64 | Ht 65.5 in | Wt 192.1 lb

## 2019-08-09 DIAGNOSIS — R112 Nausea with vomiting, unspecified: Secondary | ICD-10-CM | POA: Diagnosis not present

## 2019-08-09 DIAGNOSIS — R159 Full incontinence of feces: Secondary | ICD-10-CM

## 2019-08-09 DIAGNOSIS — R1030 Lower abdominal pain, unspecified: Secondary | ICD-10-CM | POA: Diagnosis not present

## 2019-08-09 DIAGNOSIS — M6208 Separation of muscle (nontraumatic), other site: Secondary | ICD-10-CM | POA: Diagnosis not present

## 2019-08-09 DIAGNOSIS — K429 Umbilical hernia without obstruction or gangrene: Secondary | ICD-10-CM | POA: Diagnosis not present

## 2019-08-09 NOTE — Patient Instructions (Signed)
If you are age 62 or older, your body mass index should be between 23-30. Your Body mass index is 31.49 kg/m. If this is out of the aforementioned range listed, please consider follow up with your Primary Care Provider.  If you are age 1 or younger, your body mass index should be between 19-25. Your Body mass index is 31.49 kg/m. If this is out of the aformentioned range listed, please consider follow up with your Primary Care Provider.   Please purchase the following medications over the counter and take as directed:  IBguard- 4 sample boxes given   We are going to refer you to general surgery for evaluation of an umbilical hernia and distasis recti.  Complete cardiac evaluation. Call if your symtoms worsen.  Follow up with Dr Marina Goodell in 6 weeks  Due to recent changes in healthcare laws, you may see the results of your imaging and laboratory studies on MyChart before your provider has had a chance to review them.  We understand that in some cases there may be results that are confusing or concerning to you. Not all laboratory results come back in the same time frame and the provider may be waiting for multiple results in order to interpret others.  Please give [place] 48 hours in order for your provider to thoroughly review all the results before contacting the office for clarification of your results.

## 2019-08-09 NOTE — Progress Notes (Signed)
08/09/2019 Jason Munoz [id number] 1956-02-10   Chief Complaint:  Jason Munoz. Jason Munoz is a 64 year old male with a past medical history of anxiety, hypertension, CAD,  MI stent x 1 2006, AAA, obstructive sleep apnea uses Cpap, left rib fracture secondary to a mechanical fall 09/2018 and GERD. Current smoker. He complains of having occasional nausea  3 to 4 days weekly for the past 3 months. No vomiting. He complains of having left mid abdominal pain which sometimes radiates across his abdomen which started 2 months ago. He describes his abdominal pain as sharp at times with a constant dull ache. His PCP prescribed Pantoprazole 40mg  po bid one week ago and his nausea and abdominal pain have improved. He underwent an abdominal/pelvic CT with contrast 6/17/20201 which showed sigmoid diverticulosis without evidence of diverticulitis and a stable 3.4 cm infrarenal abdominal aortic aneurysm. Normal pancreas. He has occasional fecal incontinence which started 6 months ago. He reported having 4 to 5 episodes of fecal incontinence which typically occurs when he is away from home which is quite frustrating. He does not have warning and finds himself soiled with peanut butter consistency stool. No watery diarrhea. No rectal bleeding. No specific food triggers. No new medications within the past 6 months. He eats cheese most days, no other dairy products. He underwent an EGD 09/14/2018 which was normal.  Colonoscopy was done on the same date which identified a normal terminal ileum, diverticulosis in the sigmoid colon and internal hemorrhoids.  He complain of feeling fatigued.  He has occasional dizziness. Last week, he felt SOB, he went to the bathroom to urinate and he was briefly disoriented. He thought the sink was pulling away from the wall. He is having left chest pain which is intermittent, not severe. He contacted his cardiologist, Dr. Martinique. He is scheduled for a stress test on 08/17/2019. No current chest  pain. His wife is present.    Current Medications, Allergies, Past Medical History, Past Surgical History, Family History and Social History were reviewed in [employer] record.   Physical Exam: BP 110/78 (BP Location: Left Arm, Patient Position: Sitting, Cuff Size: Normal)   Pulse 64   Ht 5' 5.5" (1.664 m) Comment: height measured without shoes  Wt 192 lb 2 oz (87.1 kg)   BMI 31.49 kg/m  General: Well developed 64 year old male in no acute distress. Head: Normocephalic and atraumatic. Eyes: No scleral icterus. Conjunctiva pink . Ears: Normal auditory acuity. Mouth: Dentition intact. No ulcers or lesions.  Lungs: Clear throughout to auscultation. Heart: Regular rate and rhythm, no murmur. Abdomen: Soft,  Nondistended. Mild tenderness left of the umbilicus without rebound or guarding. Small umbilical hernia. Moderate diastasis recti, possible underlying ventral hernia.  No masses or hepatomegaly. Normal bowel sounds x 4 quadrants.  Rectal: No mass. Diminished sphincter tone. No stool in the rectal vault.  Musculoskeletal: Symmetrical with no gross deformities. Extremities: No edema. Neurological: Alert oriented x 4. No focal deficits.  Psychological: Alert and cooperative. Normal mood and affect  Assessment and Recommendations:  24. 63 year old male with left mid abdominal pain, small umbilical hernia, moderate diastasis recti. -General surgery consult to to further evaluate umbilical hernia and diastasis recti  -Request labs done by Dr. Reynaldo Minium 07/2019 -IBGARD 1 tab po bid PRN  2. Nausea, improving.  Normal EGD 08/2018. Patient is on ASA 81mg  daily and Celebrex.  -Continue Pantoprazole 40 mg p.o. twice daily for now  2. Fecal incontinence  -  Fiber as tolerated to bulk up stool -PT to call office if symptoms worsen  3. History of CAD. CP. Fatigue.  -Proceed with cardiac evaluation   Follow up in office in 6 weeks

## 2019-08-10 ENCOUNTER — Telehealth: Payer: Self-pay | Admitting: General Surgery

## 2019-08-10 NOTE — Telephone Encounter (Signed)
Sent referral to [facility] for an appointment for consult of hernia. Fax transmittle states successful.

## 2019-08-10 NOTE — Progress Notes (Signed)
Assessment and plan noted ?

## 2019-08-15 ENCOUNTER — Telehealth (HOSPITAL_COMMUNITY): Payer: Self-pay

## 2019-08-15 NOTE — Telephone Encounter (Signed)
Encounter complete. 

## 2019-08-16 ENCOUNTER — Telehealth: Payer: Self-pay | Admitting: Nurse Practitioner

## 2019-08-17 ENCOUNTER — Other Ambulatory Visit: Payer: Self-pay

## 2019-08-17 ENCOUNTER — Ambulatory Visit (HOSPITAL_COMMUNITY)
Admission: RE | Admit: 2019-08-17 | Discharge: 2019-08-17 | Disposition: A | Discharge status: Home / Self Care | Payer: 59 | Source: Ambulatory Visit | Attending: Cardiology | Admitting: Cardiology

## 2019-08-17 DIAGNOSIS — R0600 Dyspnea, unspecified: Secondary | ICD-10-CM | POA: Diagnosis not present

## 2019-08-17 DIAGNOSIS — R079 Chest pain, unspecified: Secondary | ICD-10-CM

## 2019-08-17 LAB — MYOCARDIAL PERFUSION IMAGING
LV dias vol: 94 mL (ref 62–150)
LV sys vol: 51 mL
Peak HR: 96 {beats}/min
Rest HR: 64 {beats}/min
SDS: 3
SRS: 4
SSS: 7
TID: 1.03

## 2019-08-17 MED ORDER — TECHNETIUM TC 99M TETROFOSMIN IV KIT
30.6000 | PACK | Freq: Once | INTRAVENOUS | Status: AC | PRN
  Administered 2019-08-17: 30.6 via INTRAVENOUS
  Filled 2019-08-17: qty 31

## 2019-08-17 MED ORDER — REGADENOSON 0.4 MG/5ML IV SOLN
0.4000 mg | Freq: Once | INTRAVENOUS | Status: AC
  Administered 2019-08-17: 0.4 mg via INTRAVENOUS

## 2019-08-17 MED ORDER — TECHNETIUM TC 99M TETROFOSMIN IV KIT
10.4000 | PACK | Freq: Once | INTRAVENOUS | Status: AC | PRN
  Administered 2019-08-17: 10.4 via INTRAVENOUS
  Filled 2019-08-17: qty 11

## 2019-08-17 NOTE — Telephone Encounter (Signed)
Left a message for Jason Munoz explaining that I contacted [facility] regarding his referral and left a message for the NP coordinator [place]. Blanca at [facility] informed me that they do have his information to schedule.

## 2019-08-22 ENCOUNTER — Telehealth: Payer: Self-pay

## 2019-08-22 NOTE — Telephone Encounter (Addendum)
Left a voice message for the patient to give the office a call to get his results for recent lexisccan and to get scheduled for a follow up appointment after his upcoming ECHO.   ----- Message from Azalee Course, [place] sent at 08/22/2019  8:27 AM EDT ----- Low risk study, however mildly abnormal perfusion defect on the bottom side of the heart, pumping function borderline low. Recommend move up the echo and followup earlier after the echo.

## 2019-08-23 ENCOUNTER — Telehealth: Payer: Self-pay | Admitting: Physician Assistant

## 2019-08-23 NOTE — Telephone Encounter (Signed)
Left message for patient to call regarding new appointment for Echo (08/24/19 at 10:15 am at [facility]---arrival time is 10:00am---Entrance "C") and new follow up appointment with Azalee Course, PA (08/29/19 at 1:15pm)

## 2019-08-23 NOTE — Telephone Encounter (Signed)
Patient returned my call. The patient has been notified of the result and made aware of possibly moving his Echocardiogram and follow up appointment to sooner times. Patient verbalized understanding. All questions (if any) were answered. Dorris Fetch, CMA 08/23/2019 9:36 AM

## 2019-08-23 NOTE — Progress Notes (Signed)
This encounter was created in error - please disregard.

## 2019-08-24 ENCOUNTER — Other Ambulatory Visit: Payer: Self-pay

## 2019-08-24 ENCOUNTER — Ambulatory Visit (HOSPITAL_COMMUNITY)
Admission: RE | Admit: 2019-08-24 | Discharge: 2019-08-24 | Disposition: A | Discharge status: Home / Self Care | Payer: 59 | Source: Ambulatory Visit | Attending: Physician Assistant | Admitting: Physician Assistant

## 2019-08-24 ENCOUNTER — Ambulatory Visit (HOSPITAL_COMMUNITY): Payer: 59

## 2019-08-24 DIAGNOSIS — R0600 Dyspnea, unspecified: Secondary | ICD-10-CM | POA: Insufficient documentation

## 2019-08-24 DIAGNOSIS — I2510 Atherosclerotic heart disease of native coronary artery without angina pectoris: Secondary | ICD-10-CM | POA: Diagnosis not present

## 2019-08-24 DIAGNOSIS — R079 Chest pain, unspecified: Secondary | ICD-10-CM | POA: Diagnosis not present

## 2019-08-24 DIAGNOSIS — I252 Old myocardial infarction: Secondary | ICD-10-CM | POA: Diagnosis not present

## 2019-08-24 DIAGNOSIS — I10 Essential (primary) hypertension: Secondary | ICD-10-CM | POA: Insufficient documentation

## 2019-08-24 NOTE — Telephone Encounter (Signed)
Spoke with patient's wife Andrey Campanile on [place]) regarding appointment for Echo and follow up.  Patient is having his Echo today 08/24/19 and follow up is Tuesday 08/29/19 at 1:15pm---patient to arrive at 1:00pm for check in.  Mrs. Golinski voiced her understanding.

## 2019-08-24 NOTE — Progress Notes (Signed)
  Echocardiogram 2D Echocardiogram has been performed.  Jason Munoz 08/24/2019, 11:02 AM

## 2019-08-24 NOTE — Telephone Encounter (Signed)
Left message for patient to call regarding Echo appt

## 2019-08-24 NOTE — Telephone Encounter (Signed)
Left message for patient to call regarding Echo appointment scheduled Friday 08/25/19 at 9:00 am at [facility].. Requested he call ASAP so I can relay the appointment information

## 2019-08-25 ENCOUNTER — Other Ambulatory Visit: Payer: Self-pay | Admitting: Physician Assistant

## 2019-08-25 ENCOUNTER — Other Ambulatory Visit (HOSPITAL_COMMUNITY): Payer: 59

## 2019-08-25 ENCOUNTER — Telehealth: Payer: Self-pay | Admitting: Physician Assistant

## 2019-08-25 MED ORDER — AMLODIPINE BESYLATE 2.5 MG PO TABS: 2.5000 mg | ORAL_TABLET | Freq: Every day | ORAL | 0 refills | Status: DC

## 2019-08-25 NOTE — Telephone Encounter (Signed)
Pt c/o of Chest Pain: STAT if CP now or developed within 24 hours  1. Are you having CP right now? no  2. Are you experiencing any other symptoms (ex. SOB, nausea, vomiting, sweating)? SOB, nausea, dizziness  3. How long have you been experiencing CP? This morning  4. Is your CP continuous or coming and going? Comes and goes  5. Have you taken Nitroglycerin? No  Patient's wife states the patient had chest pain this morning and his SOB has been getting worse.

## 2019-08-25 NOTE — Telephone Encounter (Signed)
Spoke with pt who report pt is continuing to have CP, SOB, and no energy daily. Wife voiced pt recently had a stress test and ECHO and is very concerned. Wife voiced pt denies current symptoms. Will route to PA for recommendations but wife informed to have pt seek emergent care if symptoms reoccur. Pt verbalized understanding.

## 2019-08-25 NOTE — Telephone Encounter (Signed)
Spoke with Jason Munoz on the phone, with intermittent chest pain and worsening dyspnea with exertion, and combined with recent somewhat abnormal myoview, I am concerned he might be developing a new blockage. We talked about options today such as going to ED vs moving his followup to a earlier date. He prefers to avoid ED for now. I will move him to Monday morning. He likely will need a cardiac catheterization. During the mean time, I started him on amlodipine 2.5mg  daily. He is aware that if symptom worsens he will need to go to the ED.

## 2019-08-28 ENCOUNTER — Ambulatory Visit: Payer: 59 | Admitting: Physician Assistant

## 2019-08-28 ENCOUNTER — Other Ambulatory Visit: Payer: Self-pay

## 2019-08-28 ENCOUNTER — Encounter: Payer: Self-pay | Admitting: Physician Assistant

## 2019-08-28 VITALS — BP 122/66 | HR 62 | Ht 65.5 in | Wt 191.6 lb

## 2019-08-28 DIAGNOSIS — Z01810 Encounter for preprocedural cardiovascular examination: Secondary | ICD-10-CM | POA: Diagnosis not present

## 2019-08-28 DIAGNOSIS — I714 Abdominal aortic aneurysm, without rupture, unspecified: Secondary | ICD-10-CM

## 2019-08-28 DIAGNOSIS — E785 Hyperlipidemia, unspecified: Secondary | ICD-10-CM | POA: Diagnosis not present

## 2019-08-28 DIAGNOSIS — I25110 Atherosclerotic heart disease of native coronary artery with unstable angina pectoris: Secondary | ICD-10-CM

## 2019-08-28 NOTE — H&P (View-Only) (Signed)
Cardiology Office Note:    Date:  08/29/2019   ID:  Jason Munoz, DOB Jan 21, 1956, MRN [id number]  PCP:  Geoffry Paradise, MD  [facility] Cardiologist:  Peter Swaziland, MD  [facility] Electrophysiologist:  None   Referring MD: Geoffry Paradise, MD   Chief Complaint  Patient presents with  . Follow-up    Echocardiogram, Nuclear stress     History of Present Illness:    Jason Munoz is a 64 y.o. male with a hx of CAD, hyperlipidemia, AAA, obstructive sleep apnea, GERD and the tobacco abuse.  He underwent BMS to LAD in 2006.  Nuclear stress test in 2014 was negative. Ultrasound obtained in 2020 showed AAA measuring at 3.4 cm. He stopped taking the losartan HCTZ and his abdominal pain went away.  He was last seen by Dr. Swaziland on 04/24/2019 at which time he was doing well. He had CT abdomen and pelvis with contrast on 08/03/2019, this revealed a stable 3.4 cm infrarenal abdominal aortic aneurysm.  Recommend follow-up ultrasound in 3 years. I last saw the patient on 08/04/2019 along with his wife. He was complaining of worsening dyspnea on exertion at the time. He quit smoking in April however has since noticing intermittent chest tightness across the entire chest.  Symptom was quite transient when compared to the previous angina in 2006.  He also complained of associated right abdominal discomfort.  I recommended echocardiogram and Myoview at the time.  Echocardiogram obtained on 08/24/2019 showed EF 60 to 65%, grade 1 DD.  Myoview obtained on 08/17/2019 showed EF 46%, small defect of mild severity present in the basal inferior and mid inferior location, this is consistent with ischemia, overall it was a low risk study.  I spoke with the patient and his wife last Friday, he continued to have intermittent chest pain and worsening dyspnea.  It was unclear to me whether his symptom was related to cardiac versus pulmonary etiology.  However was persistent symptom, I think it is prudent to rule out  cardiac issue.  I started him on 2.5 mg daily of amlodipine for antianginal purposes.  I discussed the case with Dr. Swaziland today, we recommend a left and right heart cath.  On exam, he has no lower extremity edema and his lung is clear.  He does have diminished breath sound in his lung consistent with history of smoking.   Past Medical History:  Diagnosis Date  . AAA (abdominal aortic aneurysm) (HCC) 09/2018   3.4cm  . Allergy   . Anxiety   . Arthritis   . Colitis   . Coronary artery disease 12/06   stent LAD in 2006  . GERD (gastroesophageal reflux disease)   . HTN (hypertension)   . Hypercholesteremia   . Myocardial infarction (HCC)   . Sleep apnea    uses cpap  . Snoring   . Substance abuse (HCC)   . Umbilical hernia   . Ventral hernia     Past Surgical History:  Procedure Laterality Date  . 2006    . APPENDECTOMY    . bilateral ankle fractures    . CARPAL TUNNEL RELEASE    . COLONOSCOPY    . ELBOW SURGERY Right   . EXTERNAL EAR SURGERY    . NECK SURGERY    . UPPER GASTROINTESTINAL ENDOSCOPY      Current Medications: Current Meds  Medication Sig  . amLODipine (NORVASC) 2.5 MG tablet Take 1 tablet (2.5 mg total) by mouth daily. (Patient taking differently:  Take 2.5 mg by mouth every evening. )  . aspirin 81 MG chewable tablet Chew 81 mg by mouth daily.   Marland Kitchen. atorvastatin (LIPITOR) 10 MG tablet Take 10 mg by mouth daily.  . celecoxib (CELEBREX) 200 MG capsule Take 200 mg by mouth 2 (two) times daily as needed (pain.).   Marland Kitchen. escitalopram (LEXAPRO) 10 MG tablet Take 10 mg by mouth daily.   . fluticasone (FLONASE) 50 MCG/ACT nasal spray Place 1-2 sprays into both nostrils daily as needed (allergies.).   Marland Kitchen. metoprolol succinate (TOPROL-XL) 25 MG 24 hr tablet Take 25 mg by mouth every evening.   . nitroGLYCERIN (NITROSTAT) 0.4 MG SL tablet Place 1 tablet (0.4 mg total) under the tongue every 5 (five) minutes as needed for chest pain (Up to 3 doses. If chest pain persistent  after the 3rd dose, call 911).  . pantoprazole (PROTONIX) 40 MG tablet Take 40 mg by mouth 2 (two) times daily.  . tamsulosin (FLOMAX) 0.4 MG CAPS capsule Take 0.4 mg by mouth every evening.   . valACYclovir (VALTREX) 500 MG tablet Take 500 mg by mouth daily.   . [DISCONTINUED] Multiple Vitamins-Minerals (MULTIVITAMIN PO) Take 1 tablet by mouth daily.     Allergies:   Patient has no known allergies.   Social History   Socioeconomic History  . Marital status: Married    Spouse name: Not on file  . Number of children: 3  . Years of education: HS  . Highest education level: Not on file  Occupational History  . Occupation: retired     Associate Professormployer: [employer]  Tobacco Use  . Smoking status: Former Smoker    Packs/day: 1.00    Types: Cigarettes    Quit date: 05/19/1996    Years since quitting: 23.2  . Smokeless tobacco: Former Clinical biochemistUser  Vaping Use  . Vaping Use: Former  Substance and Sexual Activity  . Alcohol use: Never  . Drug use: No  . Sexual activity: Yes    Birth control/protection: None  Other Topics Concern  . Not on file  Social History Narrative   Lives at home with his wife.   Right-handed.   No caffeine use.   Social Determinants of Health   Financial Resource Strain:   . Difficulty of Paying Living Expenses:   Food Insecurity:   . Worried About Programme researcher, broadcasting/film/videounning Out of Food in the Last Year:   . Baristaan Out of Food in the Last Year:   Transportation Needs:   . Freight forwarderLack of Transportation (Medical):   Marland Kitchen. Lack of Transportation (Non-Medical):   Physical Activity:   . Days of Exercise per Week:   . Minutes of Exercise per Session:   Stress:   . Feeling of Stress :   Social Connections:   . Frequency of Communication with Friends and Family:   . Frequency of Social Gatherings with Friends and Family:   . Attends Religious Services:   . Active Member of Clubs or Organizations:   . Attends BankerClub or Organization Meetings:   Marland Kitchen. Marital Status:      Family History: The  patient's family history includes COPD in his father. There is no history of Stomach cancer, Rectal cancer, Esophageal cancer, Colon cancer, or Colon polyps.  ROS:   Please see the history of present illness.     All other systems reviewed and are negative.  EKGs/Labs/Other Studies Reviewed:    The following studies were reviewed today:  Myoview 08/17/2019  The left ventricular ejection fraction is  mildly decreased (45-54%).  Nuclear stress EF: 46%.  There was no ST segment deviation noted during stress.  Defect 1: There is a small defect of mild severity present in the basal inferior and mid inferior location.  Findings consistent with ischemia.  This is a low risk study.   Low risk stress nuclear study with mild inferior wall ischemia and mildly reduced left ventricular global systolic function.    Echo 08/24/2019 1. Left ventricular ejection fraction, by estimation, is 60 to 65%. The  left ventricle has normal function. The left ventricle has no regional  wall motion abnormalities. Left ventricular diastolic parameters are  consistent with Grade I diastolic  dysfunction (impaired relaxation).  2. Right ventricular systolic function is normal. The right ventricular  size is normal.  3. The mitral valve is grossly normal. Trivial mitral valve  regurgitation. No evidence of mitral stenosis.  4. The aortic valve is tricuspid. Aortic valve regurgitation is not  visualized. No aortic stenosis is present.   EKG:  EKG is not ordered today.    Recent Labs: 09/05/2018: ALT 10 08/28/2019: BUN 13; Creatinine, Ser 1.35; Hemoglobin 16.8; Platelets 213; Potassium 4.9; Sodium 143  Recent Lipid Panel No results found for: CHOL, TRIG, HDL, CHOLHDL, VLDL, LDLCALC, LDLDIRECT  Physical Exam:    VS:  BP 122/66   Pulse 62   Ht 5' 5.5" (1.664 m)   Wt 191 lb 9.6 oz (86.9 kg)   SpO2 94%   BMI 31.40 kg/m     Wt Readings from Last 3 Encounters:  08/28/19 191 lb 9.6 oz (86.9 kg)    08/17/19 192 lb (87.1 kg)  08/09/19 192 lb 2 oz (87.1 kg)     GEN:  Well nourished, well developed in no acute distress HEENT: Normal NECK: No JVD; No carotid bruits LYMPHATICS: No lymphadenopathy CARDIAC: RRR, no murmurs, rubs, gallops RESPIRATORY:  Clear to auscultation without rales, wheezing or rhonchi  ABDOMEN: Soft, non-tender, non-distended MUSCULOSKELETAL:  No edema; No deformity  SKIN: Warm and dry NEUROLOGIC:  Alert and oriented x 3 PSYCHIATRIC:  Normal affect   ASSESSMENT:    1. Coronary artery disease involving native coronary artery of native heart with unstable angina pectoris (HCC)   2. Pre-procedural cardiovascular examination   3. Hyperlipidemia LDL goal <70   4. AAA (abdominal aortic aneurysm) without rupture (HCC)    PLAN:    In order of problems listed above:  1. CAD: Recent Myoview showed EF 46%, small defect of mild severity present in the basal inferior and mid inferior location consistent with ischemia.  He continued to have progressive dyspnea on exertion and intermittent chest discomfort.  I discussed the case with Dr. Swaziland who recommended left and right heart cath.  Patient was agreeable  - Risk and benefit of procedure explained to the patient who display clear understanding and agree to proceed.  Discussed with patient possible procedural risk include bleeding, vascular injury, renal injury, arrythmia, MI, stroke and loss of limb or life.  2. Hyperlipidemia: On Lipitor  3. AAA: Recent CT abdomen and pelvis with contrast obtained on 08/03/2019 showed a stable 3.4 cm infrarenal abdominal aortic aneurysm, recommend follow-up ultrasound in 3 years.   Medication Adjustments/Labs and Tests Ordered: Current medicines are reviewed at length with the patient today.  Concerns regarding medicines are outlined above.  Orders Placed This Encounter  Procedures  . CBC  . Basic metabolic panel   No orders of the defined types were placed in this  encounter.  Patient Instructions  Medication Instructions:  Your physician recommends that you continue on your current medications as directed. Please refer to the Current Medication list given to you today.  *If you need a refill on your cardiac medications before your next appointment, please call your pharmacy*  Lab Work: Your physician recommends that you return for lab work TODAY:   BMET   CBC If you have labs (blood work) drawn today and your tests are completely normal, you will receive your results only by: Marland Kitchen MyChart Message (if you have MyChart) OR . A paper copy in the mail If you have any lab test that is abnormal or we need to change your treatment, we will call you to review the results.  Testing/Procedures: Your physician has requested that you have a cardiac catheterization. Cardiac catheterization is used to diagnose and/or treat various heart conditions. Doctors may recommend this procedure for a number of different reasons. The most common reason is to evaluate chest pain. Chest pain can be a symptom of coronary artery disease (CAD), and cardiac catheterization can show whether plaque is narrowing or blocking your heart's arteries. This procedure is also used to evaluate the valves, as well as measure the blood flow and oxygen levels in different parts of your heart. For further information please visit [email or web address] Please follow instruction sheet, as given.   Follow-Up: At [facility], you and your health needs are our priority.  As part of our continuing mission to provide you with exceptional heart care, we have created designated Provider Care Teams.  These Care Teams include your primary Cardiologist (physician) and Advanced Practice Providers (APPs -  Physician Assistants and Nurse Practitioners) who all work together to provide you with the care you need, when you need it.  Your next appointment:   2 week(s) after cardiac catheterization  The format  for your next appointment:   In Person  Provider:   You may see Peter Swaziland, MD or one of the following Advanced Practice Providers on your designated Care Team:    Azalee Course, PA-C  Micah Flesher, [place] or   Judy Pimple, [place]  Other Instructions       [facility] CARDIOVASCULAR DIVISION [facility] [address] [place] [address] [place] [address] Dept: [phone] Loc: [phone]  Jason Munoz  08/28/2019  You are scheduled for a Cardiac Catheterization on Friday, July 16 with Dr. Peter Swaziland.  1. Please arrive at the [facility] (Main Entrance A) at [facility]: [address] [place], [place] [address] at 10:00 AM (This time is two hours before your procedure to ensure your preparation). Free valet parking service is available.   Special note: Every effort is made to have your procedure done on time. Please understand that emergencies sometimes delay scheduled procedures.  2. Diet: Do not eat solid foods after midnight.  The patient may have clear liquids until 5am upon the day of the procedure.  3. Labs: You will need to have blood drawn on Monday, July 12 at [facility] [address], [place]  Open: 8am - 5pm (Lunch 12:30 - 1:30)   Phone: [phone]. You do not need to be fasting.  4. Medication instructions in preparation for your procedure:   Contrast Allergy: No   On the morning of your procedure, take your Aspirin and any morning medicines NOT listed above.  You may use sips of water.  5. Plan for one night stay--bring personal belongings. 6.  Bring a current list of your medications and current insurance cards. 7. You MUST have a responsible person to drive you home. 8. Someone MUST be with you the first 24 hours after you arrive home or your discharge will be delayed. 9. Please wear clothes that are easy to get on and off and wear slip-on shoes.  Thank you for allowing [place] to care for you!    -- [facility] Invasive Cardiovascular services        Signed, Azalee Course, [place]  08/29/2019 11:22 PM    [facility]

## 2019-08-28 NOTE — Patient Instructions (Addendum)
Medication Instructions:  Your physician recommends that you continue on your current medications as directed. Please refer to the Current Medication list given to you today.  *If you need a refill on your cardiac medications before your next appointment, please call your pharmacy*  Lab Work: Your physician recommends that you return for lab work TODAY:   BMET   CBC If you have labs (blood work) drawn today and your tests are completely normal, you will receive your results only by: Marland Kitchen MyChart Message (if you have MyChart) OR . A paper copy in the mail If you have any lab test that is abnormal or we need to change your treatment, we will call you to review the results.  Testing/Procedures: Your physician has requested that you have a cardiac catheterization. Cardiac catheterization is used to diagnose and/or treat various heart conditions. Doctors may recommend this procedure for a number of different reasons. The most common reason is to evaluate chest pain. Chest pain can be a symptom of coronary artery disease (CAD), and cardiac catheterization can show whether plaque is narrowing or blocking your heart's arteries. This procedure is also used to evaluate the valves, as well as measure the blood flow and oxygen levels in different parts of your heart. For further information please visit [email or web address] Please follow instruction sheet, as given.   Follow-Up: At [facility], you and your health needs are our priority.  As part of our continuing mission to provide you with exceptional heart care, we have created designated Provider Care Teams.  These Care Teams include your primary Cardiologist (physician) and Advanced Practice Providers (APPs -  Physician Assistants and Nurse Practitioners) who all work together to provide you with the care you need, when you need it.  Your next appointment:   2 week(s) after cardiac catheterization  The format for your next appointment:   In  Person  Provider:   You may see Peter Swaziland, MD or one of the following Advanced Practice Providers on your designated Care Team:    Azalee Course, PA-C  Micah Flesher, [place] or   Judy Pimple, [place]  Other Instructions       [facility] CARDIOVASCULAR DIVISION [facility] [address] [place] [address] Dept: [phone] Loc: [phone]  Jason Munoz  08/28/2019  You are scheduled for a Cardiac Catheterization on Friday, July 16 with Dr. Peter Swaziland.  1. Please arrive at the [facility] (Main Entrance A) at [facility]: [address] [place], [place] [address] at 10:00 AM (This time is two hours before your procedure to ensure your preparation). Free valet parking service is available.   Special note: Every effort is made to have your procedure done on time. Please understand that emergencies sometimes delay scheduled procedures.  2. Diet: Do not eat solid foods after midnight.  The patient may have clear liquids until 5am upon the day of the procedure.  3. Labs: You will need to have blood drawn on Monday, July 12 at [facility] [address], [place]  Open: 8am - 5pm (Lunch 12:30 - 1:30)   Phone: [phone]. You do not need to be fasting.  4. Medication instructions in preparation for your procedure:   Contrast Allergy: No   On the morning of your procedure, take your Aspirin and any morning medicines NOT listed above.  You may use sips of water.  5. Plan for one night stay--bring personal belongings. 6. Bring a current  list of your medications and current insurance cards. 7. You MUST have a responsible person to drive you home. 8. Someone MUST be with you the first 24 hours after you arrive home or your discharge will be delayed. 9. Please wear clothes that are easy to get on and off and wear slip-on shoes.  Thank you for allowing [place] to care for you!   -- [place] [facility]  Cardiovascular services

## 2019-08-28 NOTE — Progress Notes (Signed)
Cardiology Office Note:    Date:  08/29/2019   ID:  Jason Munoz, DOB Jan 21, 1956, MRN [id number]  PCP:  Geoffry Paradise, MD  [facility] Cardiologist:  Peter Swaziland, MD  [facility] Electrophysiologist:  None   Referring MD: Geoffry Paradise, MD   Chief Complaint  Patient presents with  . Follow-up    Echocardiogram, Nuclear stress     History of Present Illness:    Jason Munoz is a 64 y.o. male with a hx of CAD, hyperlipidemia, AAA, obstructive sleep apnea, GERD and the tobacco abuse.  He underwent BMS to LAD in 2006.  Nuclear stress test in 2014 was negative. Ultrasound obtained in 2020 showed AAA measuring at 3.4 cm. He stopped taking the losartan HCTZ and his abdominal pain went away.  He was last seen by Dr. Swaziland on 04/24/2019 at which time he was doing well. He had CT abdomen and pelvis with contrast on 08/03/2019, this revealed a stable 3.4 cm infrarenal abdominal aortic aneurysm.  Recommend follow-up ultrasound in 3 years. I last saw the patient on 08/04/2019 along with his wife. He was complaining of worsening dyspnea on exertion at the time. He quit smoking in April however has since noticing intermittent chest tightness across the entire chest.  Symptom was quite transient when compared to the previous angina in 2006.  He also complained of associated right abdominal discomfort.  I recommended echocardiogram and Myoview at the time.  Echocardiogram obtained on 08/24/2019 showed EF 60 to 65%, grade 1 DD.  Myoview obtained on 08/17/2019 showed EF 46%, small defect of mild severity present in the basal inferior and mid inferior location, this is consistent with ischemia, overall it was a low risk study.  I spoke with the patient and his wife last Friday, he continued to have intermittent chest pain and worsening dyspnea.  It was unclear to me whether his symptom was related to cardiac versus pulmonary etiology.  However was persistent symptom, I think it is prudent to rule out  cardiac issue.  I started him on 2.5 mg daily of amlodipine for antianginal purposes.  I discussed the case with Dr. Swaziland today, we recommend a left and right heart cath.  On exam, he has no lower extremity edema and his lung is clear.  He does have diminished breath sound in his lung consistent with history of smoking.   Past Medical History:  Diagnosis Date  . AAA (abdominal aortic aneurysm) (HCC) 09/2018   3.4cm  . Allergy   . Anxiety   . Arthritis   . Colitis   . Coronary artery disease 12/06   stent LAD in 2006  . GERD (gastroesophageal reflux disease)   . HTN (hypertension)   . Hypercholesteremia   . Myocardial infarction (HCC)   . Sleep apnea    uses cpap  . Snoring   . Substance abuse (HCC)   . Umbilical hernia   . Ventral hernia     Past Surgical History:  Procedure Laterality Date  . 2006    . APPENDECTOMY    . bilateral ankle fractures    . CARPAL TUNNEL RELEASE    . COLONOSCOPY    . ELBOW SURGERY Right   . EXTERNAL EAR SURGERY    . NECK SURGERY    . UPPER GASTROINTESTINAL ENDOSCOPY      Current Medications: Current Meds  Medication Sig  . amLODipine (NORVASC) 2.5 MG tablet Take 1 tablet (2.5 mg total) by mouth daily. (Patient taking differently:  Take 2.5 mg by mouth every evening. )  . aspirin 81 MG chewable tablet Chew 81 mg by mouth daily.   Marland Kitchen. atorvastatin (LIPITOR) 10 MG tablet Take 10 mg by mouth daily.  . celecoxib (CELEBREX) 200 MG capsule Take 200 mg by mouth 2 (two) times daily as needed (pain.).   Marland Kitchen. escitalopram (LEXAPRO) 10 MG tablet Take 10 mg by mouth daily.   . fluticasone (FLONASE) 50 MCG/ACT nasal spray Place 1-2 sprays into both nostrils daily as needed (allergies.).   Marland Kitchen. metoprolol succinate (TOPROL-XL) 25 MG 24 hr tablet Take 25 mg by mouth every evening.   . nitroGLYCERIN (NITROSTAT) 0.4 MG SL tablet Place 1 tablet (0.4 mg total) under the tongue every 5 (five) minutes as needed for chest pain (Up to 3 doses. If chest pain persistent  after the 3rd dose, call 911).  . pantoprazole (PROTONIX) 40 MG tablet Take 40 mg by mouth 2 (two) times daily.  . tamsulosin (FLOMAX) 0.4 MG CAPS capsule Take 0.4 mg by mouth every evening.   . valACYclovir (VALTREX) 500 MG tablet Take 500 mg by mouth daily.   . [DISCONTINUED] Multiple Vitamins-Minerals (MULTIVITAMIN PO) Take 1 tablet by mouth daily.     Allergies:   Patient has no known allergies.   Social History   Socioeconomic History  . Marital status: Married    Spouse name: Not on file  . Number of children: 3  . Years of education: HS  . Highest education level: Not on file  Occupational History  . Occupation: retired     Associate Professormployer: [employer]  Tobacco Use  . Smoking status: Former Smoker    Packs/day: 1.00    Types: Cigarettes    Quit date: 05/19/1996    Years since quitting: 23.2  . Smokeless tobacco: Former Clinical biochemistUser  Vaping Use  . Vaping Use: Former  Substance and Sexual Activity  . Alcohol use: Never  . Drug use: No  . Sexual activity: Yes    Birth control/protection: None  Other Topics Concern  . Not on file  Social History Narrative   Lives at home with his wife.   Right-handed.   No caffeine use.   Social Determinants of Health   Financial Resource Strain:   . Difficulty of Paying Living Expenses:   Food Insecurity:   . Worried About Programme researcher, broadcasting/film/videounning Out of Food in the Last Year:   . Baristaan Out of Food in the Last Year:   Transportation Needs:   . Freight forwarderLack of Transportation (Medical):   Marland Kitchen. Lack of Transportation (Non-Medical):   Physical Activity:   . Days of Exercise per Week:   . Minutes of Exercise per Session:   Stress:   . Feeling of Stress :   Social Connections:   . Frequency of Communication with Friends and Family:   . Frequency of Social Gatherings with Friends and Family:   . Attends Religious Services:   . Active Member of Clubs or Organizations:   . Attends BankerClub or Organization Meetings:   Marland Kitchen. Marital Status:      Family History: The  patient's family history includes COPD in his father. There is no history of Stomach cancer, Rectal cancer, Esophageal cancer, Colon cancer, or Colon polyps.  ROS:   Please see the history of present illness.     All other systems reviewed and are negative.  EKGs/Labs/Other Studies Reviewed:    The following studies were reviewed today:  Myoview 08/17/2019  The left ventricular ejection fraction is  mildly decreased (45-54%).  Nuclear stress EF: 46%.  There was no ST segment deviation noted during stress.  Defect 1: There is a small defect of mild severity present in the basal inferior and mid inferior location.  Findings consistent with ischemia.  This is a low risk study.   Low risk stress nuclear study with mild inferior wall ischemia and mildly reduced left ventricular global systolic function.    Echo 08/24/2019 1. Left ventricular ejection fraction, by estimation, is 60 to 65%. The  left ventricle has normal function. The left ventricle has no regional  wall motion abnormalities. Left ventricular diastolic parameters are  consistent with Grade I diastolic  dysfunction (impaired relaxation).  2. Right ventricular systolic function is normal. The right ventricular  size is normal.  3. The mitral valve is grossly normal. Trivial mitral valve  regurgitation. No evidence of mitral stenosis.  4. The aortic valve is tricuspid. Aortic valve regurgitation is not  visualized. No aortic stenosis is present.   EKG:  EKG is not ordered today.    Recent Labs: 09/05/2018: ALT 10 08/28/2019: BUN 13; Creatinine, Ser 1.35; Hemoglobin 16.8; Platelets 213; Potassium 4.9; Sodium 143  Recent Lipid Panel No results found for: CHOL, TRIG, HDL, CHOLHDL, VLDL, LDLCALC, LDLDIRECT  Physical Exam:    VS:  BP 122/66   Pulse 62   Ht 5' 5.5" (1.664 m)   Wt 191 lb 9.6 oz (86.9 kg)   SpO2 94%   BMI 31.40 kg/m     Wt Readings from Last 3 Encounters:  08/28/19 191 lb 9.6 oz (86.9 kg)    08/17/19 192 lb (87.1 kg)  08/09/19 192 lb 2 oz (87.1 kg)     GEN:  Well nourished, well developed in no acute distress HEENT: Normal NECK: No JVD; No carotid bruits LYMPHATICS: No lymphadenopathy CARDIAC: RRR, no murmurs, rubs, gallops RESPIRATORY:  Clear to auscultation without rales, wheezing or rhonchi  ABDOMEN: Soft, non-tender, non-distended MUSCULOSKELETAL:  No edema; No deformity  SKIN: Warm and dry NEUROLOGIC:  Alert and oriented x 3 PSYCHIATRIC:  Normal affect   ASSESSMENT:    1. Coronary artery disease involving native coronary artery of native heart with unstable angina pectoris (HCC)   2. Pre-procedural cardiovascular examination   3. Hyperlipidemia LDL goal <70   4. AAA (abdominal aortic aneurysm) without rupture (HCC)    PLAN:    In order of problems listed above:  1. CAD: Recent Myoview showed EF 46%, small defect of mild severity present in the basal inferior and mid inferior location consistent with ischemia.  He continued to have progressive dyspnea on exertion and intermittent chest discomfort.  I discussed the case with Dr. Swaziland who recommended left and right heart cath.  Patient was agreeable  - Risk and benefit of procedure explained to the patient who display clear understanding and agree to proceed.  Discussed with patient possible procedural risk include bleeding, vascular injury, renal injury, arrythmia, MI, stroke and loss of limb or life.  2. Hyperlipidemia: On Lipitor  3. AAA: Recent CT abdomen and pelvis with contrast obtained on 08/03/2019 showed a stable 3.4 cm infrarenal abdominal aortic aneurysm, recommend follow-up ultrasound in 3 years.   Medication Adjustments/Labs and Tests Ordered: Current medicines are reviewed at length with the patient today.  Concerns regarding medicines are outlined above.  Orders Placed This Encounter  Procedures  . CBC  . Basic metabolic panel   No orders of the defined types were placed in this  encounter.  Patient Instructions  Medication Instructions:  Your physician recommends that you continue on your current medications as directed. Please refer to the Current Medication list given to you today.  *If you need a refill on your cardiac medications before your next appointment, please call your pharmacy*  Lab Work: Your physician recommends that you return for lab work TODAY:   BMET   CBC If you have labs (blood work) drawn today and your tests are completely normal, you will receive your results only by: . MyChart Message (if you have MyChart) OR . A paper copy in the mail If you have any lab test that is abnormal or we need to change your treatment, we will call you to review the results.  Testing/Procedures: Your physician has requested that you have a cardiac catheterization. Cardiac catheterization is used to diagnose and/or treat various heart conditions. Doctors may recommend this procedure for a number of different reasons. The most common reason is to evaluate chest pain. Chest pain can be a symptom of coronary artery disease (CAD), and cardiac catheterization can show whether plaque is narrowing or blocking your heart's arteries. This procedure is also used to evaluate the valves, as well as measure the blood flow and oxygen levels in different parts of your heart. For further information please visit www.cardiosmart.org. Please follow instruction sheet, as given.   Follow-Up: At [facility], you and your health needs are our priority.  As part of our continuing mission to provide you with exceptional heart care, we have created designated Provider Care Teams.  These Care Teams include your primary Cardiologist (physician) and Advanced Practice Providers (APPs -  Physician Assistants and Nurse Practitioners) who all work together to provide you with the care you need, when you need it.  Your next appointment:   2 week(s) after cardiac catheterization  The format  for your next appointment:   In Person  Provider:   You may see Peter Jordan, MD or one of the following Advanced Practice Providers on your designated Care Team:     , PA-C  Angela Duke, PA-C or   Krista Kroeger, PA-C  Other Instructions       [facility] [address] [place] [address] Dept: [phone] Loc: [phone]  Teagen L Stuard  08/28/2019  You are scheduled for a Cardiac Catheterization on Friday, July 16 with Dr. Peter Jordan.  1. Please arrive at the [facility] (Main Entrance A) at [facility]: [address] [place],  [address] at 10:00 AM (This time is two hours before your procedure to ensure your preparation). Free valet parking service is available.   Special note: Every effort is made to have your procedure done on time. Please understand that emergencies sometimes delay scheduled procedures.  2. Diet: Do not eat solid foods after midnight.  The patient may have clear liquids until 5am upon the day of the procedure.  3. Labs: You will need to have blood drawn on Monday, July 12 at [facility] [address], [place]  Open: 8am - 5pm (Lunch 12:30 - 1:30)   Phone: [phone]. You do not need to be fasting.  4. Medication instructions in preparation for your procedure:   Contrast Allergy: No   On the morning of your procedure, take your Aspirin and any morning medicines NOT listed above.  You may use sips of water.  5. Plan for one night stay--bring personal belongings. 6.   Bring a current list of your medications and current insurance cards. 7. You MUST have a responsible person to drive you home. 8. Someone MUST be with you the first 24 hours after you arrive home or your discharge will be delayed. 9. Please wear clothes that are easy to get on and off and wear slip-on shoes.  Thank you for allowing [place] to care for you!    -- [facility] services        Signed, Azalee Course, [place]  08/29/2019 11:22 PM    [facility]

## 2019-08-28 NOTE — H&P (View-Only) (Signed)
Cardiology Office Note:    Date:  08/29/2019   ID:  Jason Munoz, DOB Jan 21, 1956, MRN [id number]  PCP:  Geoffry Paradise, MD  [facility] Cardiologist:  Peter Swaziland, MD  [facility] Electrophysiologist:  None   Referring MD: Geoffry Paradise, MD   Chief Complaint  Patient presents with  . Follow-up    Echocardiogram, Nuclear stress     History of Present Illness:    Jason Munoz is a 64 y.o. male with a hx of CAD, hyperlipidemia, AAA, obstructive sleep apnea, GERD and the tobacco abuse.  He underwent BMS to LAD in 2006.  Nuclear stress test in 2014 was negative. Ultrasound obtained in 2020 showed AAA measuring at 3.4 cm. He stopped taking the losartan HCTZ and his abdominal pain went away.  He was last seen by Dr. Swaziland on 04/24/2019 at which time he was doing well. He had CT abdomen and pelvis with contrast on 08/03/2019, this revealed a stable 3.4 cm infrarenal abdominal aortic aneurysm.  Recommend follow-up ultrasound in 3 years. I last saw the patient on 08/04/2019 along with his wife. He was complaining of worsening dyspnea on exertion at the time. He quit smoking in April however has since noticing intermittent chest tightness across the entire chest.  Symptom was quite transient when compared to the previous angina in 2006.  He also complained of associated right abdominal discomfort.  I recommended echocardiogram and Myoview at the time.  Echocardiogram obtained on 08/24/2019 showed EF 60 to 65%, grade 1 DD.  Myoview obtained on 08/17/2019 showed EF 46%, small defect of mild severity present in the basal inferior and mid inferior location, this is consistent with ischemia, overall it was a low risk study.  I spoke with the patient and his wife last Friday, he continued to have intermittent chest pain and worsening dyspnea.  It was unclear to me whether his symptom was related to cardiac versus pulmonary etiology.  However was persistent symptom, I think it is prudent to rule out  cardiac issue.  I started him on 2.5 mg daily of amlodipine for antianginal purposes.  I discussed the case with Dr. Swaziland today, we recommend a left and right heart cath.  On exam, he has no lower extremity edema and his lung is clear.  He does have diminished breath sound in his lung consistent with history of smoking.   Past Medical History:  Diagnosis Date  . AAA (abdominal aortic aneurysm) (HCC) 09/2018   3.4cm  . Allergy   . Anxiety   . Arthritis   . Colitis   . Coronary artery disease 12/06   stent LAD in 2006  . GERD (gastroesophageal reflux disease)   . HTN (hypertension)   . Hypercholesteremia   . Myocardial infarction (HCC)   . Sleep apnea    uses cpap  . Snoring   . Substance abuse (HCC)   . Umbilical hernia   . Ventral hernia     Past Surgical History:  Procedure Laterality Date  . 2006    . APPENDECTOMY    . bilateral ankle fractures    . CARPAL TUNNEL RELEASE    . COLONOSCOPY    . ELBOW SURGERY Right   . EXTERNAL EAR SURGERY    . NECK SURGERY    . UPPER GASTROINTESTINAL ENDOSCOPY      Current Medications: Current Meds  Medication Sig  . amLODipine (NORVASC) 2.5 MG tablet Take 1 tablet (2.5 mg total) by mouth daily. (Patient taking differently:  Take 2.5 mg by mouth every evening. )  . aspirin 81 MG chewable tablet Chew 81 mg by mouth daily.   Marland Kitchen. atorvastatin (LIPITOR) 10 MG tablet Take 10 mg by mouth daily.  . celecoxib (CELEBREX) 200 MG capsule Take 200 mg by mouth 2 (two) times daily as needed (pain.).   Marland Kitchen. escitalopram (LEXAPRO) 10 MG tablet Take 10 mg by mouth daily.   . fluticasone (FLONASE) 50 MCG/ACT nasal spray Place 1-2 sprays into both nostrils daily as needed (allergies.).   Marland Kitchen. metoprolol succinate (TOPROL-XL) 25 MG 24 hr tablet Take 25 mg by mouth every evening.   . nitroGLYCERIN (NITROSTAT) 0.4 MG SL tablet Place 1 tablet (0.4 mg total) under the tongue every 5 (five) minutes as needed for chest pain (Up to 3 doses. If chest pain persistent  after the 3rd dose, call 911).  . pantoprazole (PROTONIX) 40 MG tablet Take 40 mg by mouth 2 (two) times daily.  . tamsulosin (FLOMAX) 0.4 MG CAPS capsule Take 0.4 mg by mouth every evening.   . valACYclovir (VALTREX) 500 MG tablet Take 500 mg by mouth daily.   . [DISCONTINUED] Multiple Vitamins-Minerals (MULTIVITAMIN PO) Take 1 tablet by mouth daily.     Allergies:   Patient has no known allergies.   Social History   Socioeconomic History  . Marital status: Married    Spouse name: Not on file  . Number of children: 3  . Years of education: HS  . Highest education level: Not on file  Occupational History  . Occupation: retired     Associate Professormployer: [employer]  Tobacco Use  . Smoking status: Former Smoker    Packs/day: 1.00    Types: Cigarettes    Quit date: 05/19/1996    Years since quitting: 23.2  . Smokeless tobacco: Former Clinical biochemistUser  Vaping Use  . Vaping Use: Former  Substance and Sexual Activity  . Alcohol use: Never  . Drug use: No  . Sexual activity: Yes    Birth control/protection: None  Other Topics Concern  . Not on file  Social History Narrative   Lives at home with his wife.   Right-handed.   No caffeine use.   Social Determinants of Health   Financial Resource Strain:   . Difficulty of Paying Living Expenses:   Food Insecurity:   . Worried About Programme researcher, broadcasting/film/videounning Out of Food in the Last Year:   . Baristaan Out of Food in the Last Year:   Transportation Needs:   . Freight forwarderLack of Transportation (Medical):   Marland Kitchen. Lack of Transportation (Non-Medical):   Physical Activity:   . Days of Exercise per Week:   . Minutes of Exercise per Session:   Stress:   . Feeling of Stress :   Social Connections:   . Frequency of Communication with Friends and Family:   . Frequency of Social Gatherings with Friends and Family:   . Attends Religious Services:   . Active Member of Clubs or Organizations:   . Attends BankerClub or Organization Meetings:   Marland Kitchen. Marital Status:      Family History: The  patient's family history includes COPD in his father. There is no history of Stomach cancer, Rectal cancer, Esophageal cancer, Colon cancer, or Colon polyps.  ROS:   Please see the history of present illness.     All other systems reviewed and are negative.  EKGs/Labs/Other Studies Reviewed:    The following studies were reviewed today:  Myoview 08/17/2019  The left ventricular ejection fraction is  mildly decreased (45-54%).  Nuclear stress EF: 46%.  There was no ST segment deviation noted during stress.  Defect 1: There is a small defect of mild severity present in the basal inferior and mid inferior location.  Findings consistent with ischemia.  This is a low risk study.   Low risk stress nuclear study with mild inferior wall ischemia and mildly reduced left ventricular global systolic function.    Echo 08/24/2019 1. Left ventricular ejection fraction, by estimation, is 60 to 65%. The  left ventricle has normal function. The left ventricle has no regional  wall motion abnormalities. Left ventricular diastolic parameters are  consistent with Grade I diastolic  dysfunction (impaired relaxation).  2. Right ventricular systolic function is normal. The right ventricular  size is normal.  3. The mitral valve is grossly normal. Trivial mitral valve  regurgitation. No evidence of mitral stenosis.  4. The aortic valve is tricuspid. Aortic valve regurgitation is not  visualized. No aortic stenosis is present.   EKG:  EKG is not ordered today.    Recent Labs: 09/05/2018: ALT 10 08/28/2019: BUN 13; Creatinine, Ser 1.35; Hemoglobin 16.8; Platelets 213; Potassium 4.9; Sodium 143  Recent Lipid Panel No results found for: CHOL, TRIG, HDL, CHOLHDL, VLDL, LDLCALC, LDLDIRECT  Physical Exam:    VS:  BP 122/66   Pulse 62   Ht 5' 5.5" (1.664 m)   Wt 191 lb 9.6 oz (86.9 kg)   SpO2 94%   BMI 31.40 kg/m     Wt Readings from Last 3 Encounters:  08/28/19 191 lb 9.6 oz (86.9 kg)    08/17/19 192 lb (87.1 kg)  08/09/19 192 lb 2 oz (87.1 kg)     GEN:  Well nourished, well developed in no acute distress HEENT: Normal NECK: No JVD; No carotid bruits LYMPHATICS: No lymphadenopathy CARDIAC: RRR, no murmurs, rubs, gallops RESPIRATORY:  Clear to auscultation without rales, wheezing or rhonchi  ABDOMEN: Soft, non-tender, non-distended MUSCULOSKELETAL:  No edema; No deformity  SKIN: Warm and dry NEUROLOGIC:  Alert and oriented x 3 PSYCHIATRIC:  Normal affect   ASSESSMENT:    1. Coronary artery disease involving native coronary artery of native heart with unstable angina pectoris (HCC)   2. Pre-procedural cardiovascular examination   3. Hyperlipidemia LDL goal <70   4. AAA (abdominal aortic aneurysm) without rupture (HCC)    PLAN:    In order of problems listed above:  1. CAD: Recent Myoview showed EF 46%, small defect of mild severity present in the basal inferior and mid inferior location consistent with ischemia.  He continued to have progressive dyspnea on exertion and intermittent chest discomfort.  I discussed the case with Dr. Swaziland who recommended left and right heart cath.  Patient was agreeable  - Risk and benefit of procedure explained to the patient who display clear understanding and agree to proceed.  Discussed with patient possible procedural risk include bleeding, vascular injury, renal injury, arrythmia, MI, stroke and loss of limb or life.  2. Hyperlipidemia: On Lipitor  3. AAA: Recent CT abdomen and pelvis with contrast obtained on 08/03/2019 showed a stable 3.4 cm infrarenal abdominal aortic aneurysm, recommend follow-up ultrasound in 3 years.   Medication Adjustments/Labs and Tests Ordered: Current medicines are reviewed at length with the patient today.  Concerns regarding medicines are outlined above.  Orders Placed This Encounter  Procedures  . CBC  . Basic metabolic panel   No orders of the defined types were placed in this  encounter.  Patient Instructions  Medication Instructions:  Your physician recommends that you continue on your current medications as directed. Please refer to the Current Medication list given to you today.  *If you need a refill on your cardiac medications before your next appointment, please call your pharmacy*  Lab Work: Your physician recommends that you return for lab work TODAY:   BMET   CBC If you have labs (blood work) drawn today and your tests are completely normal, you will receive your results only by: . MyChart Message (if you have MyChart) OR . A paper copy in the mail If you have any lab test that is abnormal or we need to change your treatment, we will call you to review the results.  Testing/Procedures: Your physician has requested that you have a cardiac catheterization. Cardiac catheterization is used to diagnose and/or treat various heart conditions. Doctors may recommend this procedure for a number of different reasons. The most common reason is to evaluate chest pain. Chest pain can be a symptom of coronary artery disease (CAD), and cardiac catheterization can show whether plaque is narrowing or blocking your heart's arteries. This procedure is also used to evaluate the valves, as well as measure the blood flow and oxygen levels in different parts of your heart. For further information please visit www.cardiosmart.org. Please follow instruction sheet, as given.   Follow-Up: At [facility], you and your health needs are our priority.  As part of our continuing mission to provide you with exceptional heart care, we have created designated Provider Care Teams.  These Care Teams include your primary Cardiologist (physician) and Advanced Practice Providers (APPs -  Physician Assistants and Nurse Practitioners) who all work together to provide you with the care you need, when you need it.  Your next appointment:   2 week(s) after cardiac catheterization  The format  for your next appointment:   In Person  Provider:   You may see Peter Jordan, MD or one of the following Advanced Practice Providers on your designated Care Team:     , PA-C  Angela Duke, PA-C or   Krista Kroeger, PA-C  Other Instructions       [facility] [address] [place] [address] Dept: [phone] Loc: [phone]  Mendel L Witherington  08/28/2019  You are scheduled for a Cardiac Catheterization on Friday, July 16 with Dr. Peter Jordan.  1. Please arrive at the North [facility] (Main Entrance A) at [facility]: [address] [place], [place] [address] at 10:00 AM (This time is two hours before your procedure to ensure your preparation). Free valet parking service is available.   Special note: Every effort is made to have your procedure done on time. Please understand that emergencies sometimes delay scheduled procedures.  2. Diet: Do not eat solid foods after midnight.  The patient may have clear liquids until 5am upon the day of the procedure.  3. Labs: You will need to have blood drawn on Monday, July 12 at [facility] [address], [place]  Open: 8am - 5pm (Lunch 12:30 - 1:30)   Phone: [phone]. You do not need to be fasting.  4. Medication instructions in preparation for your procedure:   Contrast Allergy: No   On the morning of your procedure, take your Aspirin and any morning medicines NOT listed above.  You may use sips of water.  5. Plan for one night stay--bring personal belongings. 6.   Bring a current list of your medications and current insurance cards. 7. You MUST have a responsible person to drive you home. 8. Someone MUST be with you the first 24 hours after you arrive home or your discharge will be delayed. 9. Please wear clothes that are easy to get on and off and wear slip-on shoes.  Thank you for allowing [place] to care for you!    -- [facility] services        Signed, Azalee Course, [place]  08/29/2019 11:22 PM    [facility]

## 2019-08-29 ENCOUNTER — Other Ambulatory Visit (HOSPITAL_COMMUNITY): Payer: 59

## 2019-08-29 ENCOUNTER — Ambulatory Visit: Payer: 59 | Admitting: Physician Assistant

## 2019-08-29 LAB — BASIC METABOLIC PANEL
BUN/Creatinine Ratio: 10 (ref 10–24)
BUN: 13 mg/dL (ref 8–27)
CO2: 24 mmol/L (ref 20–29)
Calcium: 10.3 mg/dL — ABNORMAL HIGH (ref 8.6–10.2)
Chloride: 103 mmol/L (ref 96–106)
Creatinine, Ser: 1.35 mg/dL — ABNORMAL HIGH (ref 0.76–1.27)
GFR calc Af Amer: 65 mL/min/{1.73_m2} (ref >59)
GFR calc non Af Amer: 56 mL/min/{1.73_m2} — ABNORMAL LOW (ref >59)
Glucose: 93 mg/dL (ref 65–99)
Potassium: 4.9 mmol/L (ref 3.5–5.2)
Sodium: 143 mmol/L (ref 134–144)

## 2019-08-29 LAB — CBC
Hematocrit: 50.9 % (ref 37.5–51.0)
Hemoglobin: 16.8 g/dL (ref 13.0–17.7)
MCH: 29.3 pg (ref 26.6–33.0)
MCHC: 33 g/dL (ref 31.5–35.7)
MCV: 89 fL (ref 79–97)
Platelets: 213 10*3/uL (ref 150–450)
RBC: 5.73 x10E6/uL (ref 4.14–5.80)
RDW: 15.9 % — ABNORMAL HIGH (ref 11.6–15.4)
WBC: 8.5 10*3/uL (ref 3.4–10.8)

## 2019-08-30 ENCOUNTER — Other Ambulatory Visit: Payer: Self-pay | Admitting: Physician Assistant

## 2019-08-30 MED ORDER — SODIUM CHLORIDE 0.9% FLUSH: 3.0000 mL | Freq: Two times a day (BID) | INTRAVENOUS | Status: DC

## 2019-08-31 ENCOUNTER — Telehealth: Payer: Self-pay | Admitting: *Deleted

## 2019-08-31 NOTE — Telephone Encounter (Signed)
Pt contacted pre-catheterization scheduled at [facility] for: Friday September 01, 2019 12 noon Verified arrival time and place: [facility] Main Entrance A [facility]) at: 10 AM   No solid food after midnight prior to cath, clear liquids until 5 AM day of procedure.  Hold: Celebrex-until post procedure-GFR 56  AM meds can be  taken pre-cath with sips of water including: ASA 81 mg   Confirmed patient has responsible adult to drive home post procedure and observe 24 hours after arriving home: yes  You are allowed ONE visitor in the waiting room during your procedure. Both you and your visitor must wear a mask once you enter the hospital.      COVID-19 Pre-Screening Questions:  . In the past 14 days have you had a new cough, shortness of breath, headache, congestion, fever (100 or greater) unexplained body aches, new sore throat, or sudden loss of taste or sense of smell? no . In the past 14 days have you been around anyone with known Covid 19? No   Reviewed procedure/mask/visitor instructions, COVID-19 screening questions with patient.  Per pt-he is fully vaccinated for COVID-19, see immunization history.

## 2019-09-01 ENCOUNTER — Encounter (HOSPITAL_COMMUNITY)
Admission: RE | Disposition: A | Discharge status: Home / Self Care | Payer: Self-pay | Source: Home / Self Care | Attending: Cardiology

## 2019-09-01 ENCOUNTER — Ambulatory Visit (HOSPITAL_COMMUNITY)
Admission: RE | Admit: 2019-09-01 | Discharge: 2019-09-01 | Disposition: A | Discharge status: Home / Self Care | Payer: 59 | Attending: Cardiology | Admitting: Cardiology

## 2019-09-01 ENCOUNTER — Other Ambulatory Visit: Payer: Self-pay

## 2019-09-01 DIAGNOSIS — Z79899 Other long term (current) drug therapy: Secondary | ICD-10-CM | POA: Diagnosis not present

## 2019-09-01 DIAGNOSIS — E785 Hyperlipidemia, unspecified: Secondary | ICD-10-CM | POA: Insufficient documentation

## 2019-09-01 DIAGNOSIS — I714 Abdominal aortic aneurysm, without rupture: Secondary | ICD-10-CM | POA: Diagnosis not present

## 2019-09-01 DIAGNOSIS — E7800 Pure hypercholesterolemia, unspecified: Secondary | ICD-10-CM | POA: Diagnosis not present

## 2019-09-01 DIAGNOSIS — I252 Old myocardial infarction: Secondary | ICD-10-CM | POA: Insufficient documentation

## 2019-09-01 DIAGNOSIS — F419 Anxiety disorder, unspecified: Secondary | ICD-10-CM | POA: Diagnosis not present

## 2019-09-01 DIAGNOSIS — Z87891 Personal history of nicotine dependence: Secondary | ICD-10-CM | POA: Insufficient documentation

## 2019-09-01 DIAGNOSIS — Z7982 Long term (current) use of aspirin: Secondary | ICD-10-CM | POA: Diagnosis not present

## 2019-09-01 DIAGNOSIS — I209 Angina pectoris, unspecified: Secondary | ICD-10-CM | POA: Diagnosis present

## 2019-09-01 DIAGNOSIS — K219 Gastro-esophageal reflux disease without esophagitis: Secondary | ICD-10-CM | POA: Diagnosis not present

## 2019-09-01 DIAGNOSIS — I25119 Atherosclerotic heart disease of native coronary artery with unspecified angina pectoris: Secondary | ICD-10-CM

## 2019-09-01 DIAGNOSIS — G4733 Obstructive sleep apnea (adult) (pediatric): Secondary | ICD-10-CM | POA: Diagnosis not present

## 2019-09-01 DIAGNOSIS — R9439 Abnormal result of other cardiovascular function study: Secondary | ICD-10-CM | POA: Diagnosis present

## 2019-09-01 DIAGNOSIS — Z955 Presence of coronary angioplasty implant and graft: Secondary | ICD-10-CM | POA: Diagnosis not present

## 2019-09-01 DIAGNOSIS — I2510 Atherosclerotic heart disease of native coronary artery without angina pectoris: Secondary | ICD-10-CM | POA: Diagnosis present

## 2019-09-01 HISTORY — PX: RIGHT/LEFT HEART CATH AND CORONARY ANGIOGRAPHY: CATH118266

## 2019-09-01 LAB — POCT I-STAT EG7
Acid-Base Excess: 1 mmol/L (ref 0.0–2.0)
Bicarbonate: 27.4 mmol/L (ref 20.0–28.0)
Calcium, Ion: 1.22 mmol/L (ref 1.15–1.40)
HCT: 39 % (ref 39.0–52.0)
Hemoglobin: 13.3 g/dL (ref 13.0–17.0)
O2 Saturation: 72 %
Potassium: 4.2 mmol/L (ref 3.5–5.1)
Sodium: 143 mmol/L (ref 135–145)
TCO2: 29 mmol/L (ref 22–32)
pCO2, Ven: 49.3 mmHg (ref 44.0–60.0)
pH, Ven: 7.353 (ref 7.250–7.430)
pO2, Ven: 40 mmHg (ref 32.0–45.0)

## 2019-09-01 LAB — POCT I-STAT 7, (LYTES, BLD GAS, ICA,H+H)
Acid-Base Excess: 1 mmol/L (ref 0.0–2.0)
Bicarbonate: 26.6 mmol/L (ref 20.0–28.0)
Calcium, Ion: 1.25 mmol/L (ref 1.15–1.40)
HCT: 39 % (ref 39.0–52.0)
Hemoglobin: 13.3 g/dL (ref 13.0–17.0)
O2 Saturation: 98 %
Potassium: 4.2 mmol/L (ref 3.5–5.1)
Sodium: 142 mmol/L (ref 135–145)
TCO2: 28 mmol/L (ref 22–32)
pCO2 arterial: 45.1 mmHg (ref 32.0–48.0)
pH, Arterial: 7.378 (ref 7.350–7.450)
pO2, Arterial: 100 mmHg (ref 83.0–108.0)

## 2019-09-01 SURGERY — RIGHT/LEFT HEART CATH AND CORONARY ANGIOGRAPHY
Anesthesia: LOCAL

## 2019-09-01 MED ORDER — SODIUM CHLORIDE 0.9 % IV SOLN: 250.0000 mL | INTRAVENOUS | Status: DC | PRN

## 2019-09-01 MED ORDER — IOHEXOL 350 MG/ML SOLN
INTRAVENOUS | Status: DC | PRN
  Administered 2019-09-01: 60 mL

## 2019-09-01 MED ORDER — SODIUM CHLORIDE 0.9 % WEIGHT BASED INFUSION
3.0000 mL/kg/h | INTRAVENOUS | Status: AC
  Administered 2019-09-01: 3 mL/kg/h via INTRAVENOUS

## 2019-09-01 MED ORDER — FENTANYL CITRATE (PF) 100 MCG/2ML IJ SOLN
INTRAMUSCULAR | Status: DC | PRN
  Administered 2019-09-01: 25 ug via INTRAVENOUS

## 2019-09-01 MED ORDER — CLOPIDOGREL BISULFATE 75 MG PO TABS
75.0000 mg | ORAL_TABLET | Freq: Every day | ORAL | 3 refills | Status: DC
Start: 2019-09-01 — End: 2020-09-02

## 2019-09-01 MED ORDER — HEPARIN SODIUM (PORCINE) 1000 UNIT/ML IJ SOLN
INTRAMUSCULAR | Status: AC
  Filled 2019-09-01: qty 1

## 2019-09-01 MED ORDER — LIDOCAINE HCL (PF) 1 % IJ SOLN
INTRAMUSCULAR | Status: AC
  Filled 2019-09-01: qty 30

## 2019-09-01 MED ORDER — SODIUM CHLORIDE 0.9% FLUSH: 3.0000 mL | Freq: Two times a day (BID) | INTRAVENOUS | Status: DC

## 2019-09-01 MED ORDER — MIDAZOLAM HCL 2 MG/2ML IJ SOLN
INTRAMUSCULAR | Status: DC | PRN
  Administered 2019-09-01: 1 mg via INTRAVENOUS

## 2019-09-01 MED ORDER — HEPARIN (PORCINE) IN NACL 1000-0.9 UT/500ML-% IV SOLN
INTRAVENOUS | Status: AC
  Filled 2019-09-01: qty 1000

## 2019-09-01 MED ORDER — ASPIRIN 81 MG PO CHEW: 81.0000 mg | CHEWABLE_TABLET | ORAL | Status: DC

## 2019-09-01 MED ORDER — SODIUM CHLORIDE 0.9 % WEIGHT BASED INFUSION: 1.0000 mL/kg/h | INTRAVENOUS | Status: DC

## 2019-09-01 MED ORDER — VERAPAMIL HCL 2.5 MG/ML IV SOLN
INTRAVENOUS | Status: AC
  Filled 2019-09-01: qty 2

## 2019-09-01 MED ORDER — ACETAMINOPHEN 325 MG PO TABS: 650.0000 mg | ORAL_TABLET | ORAL | Status: DC | PRN

## 2019-09-01 MED ORDER — HEPARIN (PORCINE) IN NACL 1000-0.9 UT/500ML-% IV SOLN
INTRAVENOUS | Status: DC | PRN
  Administered 2019-09-01 (×2): 500 mL

## 2019-09-01 MED ORDER — SODIUM CHLORIDE 0.9% FLUSH: 3.0000 mL | INTRAVENOUS | Status: DC | PRN

## 2019-09-01 MED ORDER — ATORVASTATIN CALCIUM 80 MG PO TABS
80.0000 mg | ORAL_TABLET | Freq: Every day | ORAL | 3 refills | Status: DC
Start: 2019-09-01 — End: 2020-05-06

## 2019-09-01 MED ORDER — LIDOCAINE HCL (PF) 1 % IJ SOLN
INTRAMUSCULAR | Status: DC | PRN
  Administered 2019-09-01: 10 mL
  Administered 2019-09-01 (×2): 2 mL

## 2019-09-01 MED ORDER — VERAPAMIL HCL 2.5 MG/ML IV SOLN
INTRAVENOUS | Status: DC | PRN
  Administered 2019-09-01: 10 mL via INTRA_ARTERIAL

## 2019-09-01 MED ORDER — ONDANSETRON HCL 4 MG/2ML IJ SOLN: 4.0000 mg | Freq: Four times a day (QID) | INTRAMUSCULAR | Status: DC | PRN

## 2019-09-01 MED ORDER — MIDAZOLAM HCL 2 MG/2ML IJ SOLN
INTRAMUSCULAR | Status: AC
  Filled 2019-09-01: qty 2

## 2019-09-01 MED ORDER — FENTANYL CITRATE (PF) 100 MCG/2ML IJ SOLN
INTRAMUSCULAR | Status: AC
  Filled 2019-09-01: qty 2

## 2019-09-01 MED ORDER — HEPARIN SODIUM (PORCINE) 1000 UNIT/ML IJ SOLN: INTRAMUSCULAR | Status: DC | PRN

## 2019-09-01 SURGICAL SUPPLY — 16 items
CATH 5FR JL3.5 JR4 ANG PIG MP (CATHETERS) ×2 IMPLANT
CATH SWAN GANZ 7F STRAIGHT (CATHETERS) ×1 IMPLANT
CLOSURE MYNX CONTROL 5F (Vascular Products) ×1 IMPLANT
DEVICE RAD COMP TR BAND LRG (VASCULAR PRODUCTS) ×1 IMPLANT
GLIDESHEATH SLEND SS 6F .021 (SHEATH) ×1 IMPLANT
GLIDESHEATH SLENDER 7FR .021G (SHEATH) ×1 IMPLANT
GUIDEWIRE INQWIRE 1.5J.035X260 (WIRE) ×1 IMPLANT
INQWIRE 1.5J .035X260CM (WIRE) ×2
KIT HEART LEFT (KITS) ×2 IMPLANT
PACK CARDIAC CATHETERIZATION (CUSTOM PROCEDURE TRAY) ×2 IMPLANT
SHEATH PINNACLE 5F 10CM (SHEATH) ×1 IMPLANT
SHEATH PROBE COVER 6X72 (BAG) ×2 IMPLANT
TRANSDUCER W/STOPCOCK (MISCELLANEOUS) ×2 IMPLANT
TUBING CIL FLEX 10 FLL-RA (TUBING) ×2 IMPLANT
WIRE EMERALD 3MM-J .035X150CM (WIRE) ×1 IMPLANT
WIRE HI TORQ VERSACORE-J 145CM (WIRE) ×1 IMPLANT

## 2019-09-01 NOTE — Interval H&P Note (Signed)
History and Physical Interval Note:  09/01/2019 10:15 AM  Jason Munoz  has presented today for surgery, with the diagnosis of chest pain.  The various methods of treatment have been discussed with the patient and family. After consideration of risks, benefits and other options for treatment, the patient has consented to  Procedure(s): RIGHT/LEFT HEART CATH AND CORONARY ANGIOGRAPHY (N/A) as a surgical intervention.  The patient's history has been reviewed, patient examined, no change in status, stable for surgery.  I have reviewed the patient's chart and labs.  Questions were answered to the patient's satisfaction.   Cath Lab Visit (complete for each Cath Lab visit)  Clinical Evaluation Leading to the Procedure:   ACS: No.  Non-ACS:    Anginal Classification: CCS III  Anti-ischemic medical therapy: Maximal Therapy (2 or more classes of medications)  Non-Invasive Test Results: Intermediate-risk stress test findings: cardiac mortality 1-3%/year  Prior CABG: No previous CABG        [name] [facility] 09/01/2019 10:15 AM

## 2019-09-01 NOTE — Discharge Instructions (Signed)
Drink plenty of fluids for 48 hours and keep wrist elevated at heart level for 24 hours  Radial Site Care   This sheet gives you information about how to care for yourself after your procedure. Your health care provider may also give you more specific instructions. If you have problems or questions, contact your health care provider. What can I expect after the procedure? After the procedure, it is common to have:  Bruising and tenderness at the catheter insertion area. Follow these instructions at home: Medicines  Take over-the-counter and prescription medicines only as told by your health care provider. Insertion site care 1. Follow instructions from your health care provider about how to take care of your insertion site. Make sure you: ? Wash your hands with soap and water before you change your bandage (dressing). If soap and water are not available, use hand sanitizer. ? Remove your dressing as told by your health care provider. In 24 hours 2. Check your insertion site every day for signs of infection. Check for: ? Redness, swelling, or pain. ? Fluid or blood. ? Pus or a bad smell. ? Warmth. 3. Do not take baths, swim, or use a hot tub until your health care provider approves. 4. You may shower 24-48 hours after the procedure, or as directed by your health care provider. ? Remove the dressing and gently wash the site with plain soap and water. ? Pat the area dry with a clean towel. ? Do not rub the site. That could cause bleeding. 5. Do not apply powder or lotion to the site. Activity   1. For 24 hours after the procedure, or as directed by your health care provider: ? Do not flex or bend the affected arm. ? Do not push or pull heavy objects with the affected arm. ? Do not drive yourself home from the hospital or clinic. You may drive 24 hours after the procedure unless your health care provider tells you not to. ? Do not operate machinery or power tools. 2. Do not lift  anything that is heavier than 10 lb (4.5 kg), or the limit that you are told, until your health care provider says that it is safe.  For 4 days 3. Ask your health care provider when it is okay to: ? Return to work or school. ? Resume usual physical activities or sports. ? Resume sexual activity. General instructions  If the catheter site starts to bleed, raise your arm and put firm pressure on the site. If the bleeding does not stop, get help right away. This is a medical emergency.  If you went home on the same day as your procedure, a responsible adult should be with you for the first 24 hours after you arrive home.  Keep all follow-up visits as told by your health care provider. This is important. Contact a health care provider if:  You have a fever.  You have redness, swelling, or yellow drainage around your insertion site. Get help right away if:  You have unusual pain at the radial site.  The catheter insertion area swells very fast.  The insertion area is bleeding, and the bleeding does not stop when you hold steady pressure on the area.  Your arm or hand becomes pale, cool, tingly, or numb. These symptoms may represent a serious problem that is an emergency. Do not wait to see if the symptoms will go away. Get medical help right away. Call your local emergency services (911 in the [place] Do   not drive yourself to the hospital. Summary  After the procedure, it is common to have bruising and tenderness at the site.  Follow instructions from your health care provider about how to take care of your radial site wound. Check the wound every day for signs of infection.  Do not lift anything that is heavier than 10 lb (4.5 kg), or the limit that you are told, until your health care provider says that it is safe. This information is not intended to replace advice given to you by your health care provider. Make sure you discuss any questions you have with your health care  provider. Document Revised: 03/10/2017 Document Reviewed: 03/10/2017 Elsevier Patient Education  2020 [employer] Femoral Site Care This sheet gives you information about how to care for yourself after your procedure. Your health care provider may also give you more specific instructions. If you have problems or questions, contact your health care provider. What can I expect after the procedure?  After the procedure, it is common to have:  Bruising that usually fades within 1-2 weeks.  Tenderness at the site. Follow these instructions at home: Wound care 6. Follow instructions from your health care provider about how to take care of your insertion site. Make sure you: ? Wash your hands with soap and water before you change your bandage (dressing). If soap and water are not available, use hand sanitizer. ? Remove your dressing as told by your health care provider. In 24 hours 7. Do not take baths, swim, or use a hot tub until your health care provider approves. 8. You may shower 24-48 hours after the procedure or as told by your health care provider. ? Gently wash the site with plain soap and water. ? Pat the area dry with a clean towel. ? Do not rub the site. This may cause bleeding. 9. Do not apply powder or lotion to the site. Keep the site clean and dry. 10. Check your femoral site every day for signs of infection. Check for: ? Redness, swelling, or pain. ? Fluid or blood. ? Warmth. ? Pus or a bad smell. Activity 4. For the first 2-3 days after your procedure, or as long as directed: ? Avoid climbing stairs as much as possible. ? Do not squat. 5. Do not lift anything that is heavier than 10 lb (4.5 kg), or the limit that you are told, until your health care provider says that it is safe. For 5 days 6. Rest as directed. ? Avoid sitting for a long time without moving. Get up to take short walks every 1-2 hours. 7. Do not drive for 24 hours if you were given a medicine to help  you relax (sedative). General instructions  Take over-the-counter and prescription medicines only as told by your health care provider.  Keep all follow-up visits as told by your health care provider. This is important. Contact a health care provider if you have:  A fever or chills.  You have redness, swelling, or pain around your insertion site. Get help right away if:  The catheter insertion area swells very fast.  You pass out.  You suddenly start to sweat or your skin gets clammy.  The catheter insertion area is bleeding, and the bleeding does not stop when you hold steady pressure on the area.  The area near or just beyond the catheter insertion site becomes pale, cool, tingly, or numb. These symptoms may represent a serious problem that is an emergency. Do not wait  to see if the symptoms will go away. Get medical help right away. Call your local emergency services (911 in the [place] Do not drive yourself to the hospital. Summary  After the procedure, it is common to have bruising that usually fades within 1-2 weeks.  Check your femoral site every day for signs of infection.  Do not lift anything that is heavier than 10 lb (4.5 kg), or the limit that you are told, until your health care provider says that it is safe. This information is not intended to replace advice given to you by your health care provider. Make sure you discuss any questions you have with your health care provider. Document Revised: 02/15/2017 Document Reviewed: 02/15/2017 Elsevier Patient Education  2020 [employer].  Start taking Plavix 75 mg daily Increase Lipitor to 80 mg daily.

## 2019-09-04 ENCOUNTER — Telehealth: Payer: Self-pay

## 2019-09-04 ENCOUNTER — Telehealth: Payer: Self-pay | Admitting: *Deleted

## 2019-09-04 ENCOUNTER — Encounter (HOSPITAL_COMMUNITY): Payer: Self-pay | Admitting: Cardiology

## 2019-09-04 MED FILL — Heparin Sodium (Porcine) Inj 1000 Unit/ML: INTRAMUSCULAR | Qty: 10 | Status: AC

## 2019-09-04 NOTE — Telephone Encounter (Addendum)
Left a detailed message for the patient of the results from recent ECHO and asked that if he has any questions to give our office a call.  Results were released to patient via MyChart by Freddi Starr, RN  08/28/2019 4:11 PM EDT  ----- Message from Azalee Course, PA sent at 08/25/2019  4:24 PM EDT ----- Normal pumping function of heart, mild stiffness of heart muscle. No significant valve issue

## 2019-09-04 NOTE — Telephone Encounter (Signed)
Pt contacted pre-catheterization scheduled at [facility] for: Wednesday September 06, 2019 2:30 PM Verified arrival time and place: [facility] Main Entrance A [facility]) at: 12 Noon-needs BMP/CBC Pt states he is unable to get lab prior to procedure, knows will be done on arrival at hospital AM of procedure.   No solid food after midnight prior to cath, clear liquids until 5 AM day of procedure.   AM meds can be  taken pre-cath with sips of water including: ASA 81 mg Plavix 75 mg   Confirmed patient has responsible adult to drive home post procedure and observe 24 hours after arriving home: yes  You are allowed ONE visitor in the waiting room during your procedure. Both you and your visitor must wear a mask once you enter the hospital.      COVID-19 Pre-Screening Questions:  . In the past 14 days have you had a new cough associated with shortness of breath, fever (100.4 or greater) or sudden loss of taste or sense of smell? no . In the past 14 days have you been around anyone with known Covid 19? no . Any international travel in the past 14 days? no . Have you been vaccinated for COVID-19? Yes, see immunization history   Reviewed procedure/mask/visitor instructions, COVID-19 questions with patient.

## 2019-09-06 ENCOUNTER — Ambulatory Visit (HOSPITAL_COMMUNITY)
Admission: RE | Admit: 2019-09-06 | Discharge: 2019-09-07 | Disposition: A | Discharge status: Home / Self Care | Payer: 59 | Attending: Cardiology | Admitting: Cardiology

## 2019-09-06 ENCOUNTER — Other Ambulatory Visit: Payer: Self-pay

## 2019-09-06 ENCOUNTER — Ambulatory Visit: Payer: 59 | Admitting: Physician Assistant

## 2019-09-06 ENCOUNTER — Encounter (HOSPITAL_COMMUNITY)
Admission: RE | Disposition: A | Discharge status: Home / Self Care | Payer: Self-pay | Source: Home / Self Care | Attending: Cardiology

## 2019-09-06 DIAGNOSIS — K219 Gastro-esophageal reflux disease without esophagitis: Secondary | ICD-10-CM | POA: Diagnosis not present

## 2019-09-06 DIAGNOSIS — Z87891 Personal history of nicotine dependence: Secondary | ICD-10-CM | POA: Insufficient documentation

## 2019-09-06 DIAGNOSIS — I25110 Atherosclerotic heart disease of native coronary artery with unstable angina pectoris: Secondary | ICD-10-CM | POA: Insufficient documentation

## 2019-09-06 DIAGNOSIS — Z7982 Long term (current) use of aspirin: Secondary | ICD-10-CM | POA: Diagnosis not present

## 2019-09-06 DIAGNOSIS — E785 Hyperlipidemia, unspecified: Secondary | ICD-10-CM | POA: Diagnosis not present

## 2019-09-06 DIAGNOSIS — I2582 Chronic total occlusion of coronary artery: Secondary | ICD-10-CM | POA: Diagnosis not present

## 2019-09-06 DIAGNOSIS — I252 Old myocardial infarction: Secondary | ICD-10-CM | POA: Insufficient documentation

## 2019-09-06 DIAGNOSIS — J209 Acute bronchitis, unspecified: Secondary | ICD-10-CM | POA: Diagnosis present

## 2019-09-06 DIAGNOSIS — I2510 Atherosclerotic heart disease of native coronary artery without angina pectoris: Secondary | ICD-10-CM | POA: Diagnosis present

## 2019-09-06 DIAGNOSIS — I10 Essential (primary) hypertension: Secondary | ICD-10-CM | POA: Diagnosis not present

## 2019-09-06 DIAGNOSIS — I25119 Atherosclerotic heart disease of native coronary artery with unspecified angina pectoris: Secondary | ICD-10-CM

## 2019-09-06 DIAGNOSIS — I209 Angina pectoris, unspecified: Secondary | ICD-10-CM | POA: Diagnosis present

## 2019-09-06 DIAGNOSIS — R9439 Abnormal result of other cardiovascular function study: Secondary | ICD-10-CM | POA: Diagnosis present

## 2019-09-06 DIAGNOSIS — Z955 Presence of coronary angioplasty implant and graft: Secondary | ICD-10-CM | POA: Insufficient documentation

## 2019-09-06 DIAGNOSIS — Z9989 Dependence on other enabling machines and devices: Secondary | ICD-10-CM

## 2019-09-06 DIAGNOSIS — E7800 Pure hypercholesterolemia, unspecified: Secondary | ICD-10-CM | POA: Diagnosis not present

## 2019-09-06 DIAGNOSIS — G4733 Obstructive sleep apnea (adult) (pediatric): Secondary | ICD-10-CM

## 2019-09-06 DIAGNOSIS — J449 Chronic obstructive pulmonary disease, unspecified: Secondary | ICD-10-CM | POA: Insufficient documentation

## 2019-09-06 DIAGNOSIS — F419 Anxiety disorder, unspecified: Secondary | ICD-10-CM | POA: Insufficient documentation

## 2019-09-06 DIAGNOSIS — I714 Abdominal aortic aneurysm, without rupture: Secondary | ICD-10-CM | POA: Insufficient documentation

## 2019-09-06 DIAGNOSIS — Z79899 Other long term (current) drug therapy: Secondary | ICD-10-CM | POA: Insufficient documentation

## 2019-09-06 HISTORY — PX: CORONARY CTO INTERVENTION: CATH118236

## 2019-09-06 HISTORY — PX: CORONARY ANGIOGRAPHY: CATH118303

## 2019-09-06 HISTORY — PX: CORONARY ULTRASOUND/IVUS: CATH118244

## 2019-09-06 LAB — BASIC METABOLIC PANEL
Anion gap: 11 (ref 5–15)
BUN: 20 mg/dL (ref 8–23)
CO2: 26 mmol/L (ref 22–32)
Calcium: 10 mg/dL (ref 8.9–10.3)
Chloride: 103 mmol/L (ref 98–111)
Creatinine, Ser: 1.5 mg/dL — ABNORMAL HIGH (ref 0.61–1.24)
GFR calc Af Amer: 57 mL/min — ABNORMAL LOW (ref >60)
GFR calc non Af Amer: 49 mL/min — ABNORMAL LOW (ref >60)
Glucose, Bld: 108 mg/dL — ABNORMAL HIGH (ref 70–99)
Potassium: 4.8 mmol/L (ref 3.5–5.1)
Sodium: 140 mmol/L (ref 135–145)

## 2019-09-06 LAB — CBC
HCT: 48.1 % (ref 39.0–52.0)
Hemoglobin: 15.9 g/dL (ref 13.0–17.0)
MCH: 29.7 pg (ref 26.0–34.0)
MCHC: 33.1 g/dL (ref 30.0–36.0)
MCV: 89.7 fL (ref 80.0–100.0)
Platelets: 227 10*3/uL (ref 150–400)
RBC: 5.36 MIL/uL (ref 4.22–5.81)
RDW: 15.6 % — ABNORMAL HIGH (ref 11.5–15.5)
WBC: 7.6 10*3/uL (ref 4.0–10.5)
nRBC: 0 % (ref 0.0–0.2)

## 2019-09-06 LAB — POCT ACTIVATED CLOTTING TIME
Activated Clotting Time: 527 seconds
Activated Clotting Time: 571 seconds
Activated Clotting Time: 257 seconds
Activated Clotting Time: 208 seconds
Activated Clotting Time: 180 seconds

## 2019-09-06 SURGERY — CORONARY CTO INTERVENTION
Anesthesia: LOCAL

## 2019-09-06 MED ORDER — SODIUM CHLORIDE 0.9 % WEIGHT BASED INFUSION: 1.0000 mL/kg/h | INTRAVENOUS | Status: DC

## 2019-09-06 MED ORDER — ACETAMINOPHEN 325 MG PO TABS: 650.0000 mg | ORAL_TABLET | ORAL | Status: DC | PRN

## 2019-09-06 MED ORDER — LIDOCAINE HCL (PF) 1 % IJ SOLN
INTRAMUSCULAR | Status: AC
  Filled 2019-09-06: qty 30

## 2019-09-06 MED ORDER — NITROGLYCERIN 1 MG/10 ML FOR IR/CATH LAB
INTRA_ARTERIAL | Status: AC
  Filled 2019-09-06: qty 10

## 2019-09-06 MED ORDER — CLOPIDOGREL BISULFATE 75 MG PO TABS
75.0000 mg | ORAL_TABLET | Freq: Every day | ORAL | Status: DC
  Administered 2019-09-07: 75 mg via ORAL
  Filled 2019-09-06: qty 1

## 2019-09-06 MED ORDER — ASPIRIN 81 MG PO CHEW
81.0000 mg | CHEWABLE_TABLET | Freq: Every day | ORAL | Status: DC
  Administered 2019-09-07: 81 mg via ORAL
  Filled 2019-09-06: qty 1

## 2019-09-06 MED ORDER — SODIUM CHLORIDE 0.9% FLUSH: 3.0000 mL | INTRAVENOUS | Status: DC | PRN

## 2019-09-06 MED ORDER — MELATONIN 3 MG PO TABS
3.0000 mg | ORAL_TABLET | Freq: Every day | ORAL | Status: DC
  Administered 2019-09-07: 3 mg via ORAL
  Filled 2019-09-06: qty 1

## 2019-09-06 MED ORDER — ASPIRIN 81 MG PO CHEW: 81.0000 mg | CHEWABLE_TABLET | Freq: Every day | ORAL | Status: DC

## 2019-09-06 MED ORDER — SODIUM CHLORIDE 0.9 % IV SOLN: 250.0000 mL | INTRAVENOUS | Status: DC | PRN

## 2019-09-06 MED ORDER — PANTOPRAZOLE SODIUM 40 MG PO TBEC
40.0000 mg | DELAYED_RELEASE_TABLET | Freq: Two times a day (BID) | ORAL | Status: DC
  Administered 2019-09-06 – 2019-09-07 (×2): 40 mg via ORAL
  Filled 2019-09-06 (×2): qty 1

## 2019-09-06 MED ORDER — VALACYCLOVIR HCL 500 MG PO TABS
500.0000 mg | ORAL_TABLET | Freq: Every day | ORAL | Status: DC
  Administered 2019-09-07: 500 mg via ORAL
  Filled 2019-09-06: qty 1

## 2019-09-06 MED ORDER — CLOPIDOGREL BISULFATE 75 MG PO TABS: 75.0000 mg | ORAL_TABLET | ORAL | Status: DC

## 2019-09-06 MED ORDER — LIDOCAINE HCL (PF) 1 % IJ SOLN
INTRAMUSCULAR | Status: DC | PRN
  Administered 2019-09-06 (×2): 12 mL via INTRADERMAL

## 2019-09-06 MED ORDER — IOHEXOL 350 MG/ML SOLN
INTRAVENOUS | Status: AC
  Filled 2019-09-06: qty 1

## 2019-09-06 MED ORDER — SODIUM CHLORIDE 0.9% FLUSH
3.0000 mL | Freq: Two times a day (BID) | INTRAVENOUS | Status: DC
  Administered 2019-09-06: 3 mL via INTRAVENOUS

## 2019-09-06 MED ORDER — METOPROLOL SUCCINATE ER 25 MG PO TB24
25.0000 mg | ORAL_TABLET | Freq: Every evening | ORAL | Status: DC
  Administered 2019-09-06: 25 mg via ORAL
  Filled 2019-09-06: qty 1

## 2019-09-06 MED ORDER — ONDANSETRON HCL 4 MG/2ML IJ SOLN
INTRAMUSCULAR | Status: AC
  Filled 2019-09-06: qty 2

## 2019-09-06 MED ORDER — ATORVASTATIN CALCIUM 80 MG PO TABS
80.0000 mg | ORAL_TABLET | Freq: Every day | ORAL | Status: DC
  Administered 2019-09-07: 80 mg via ORAL
  Filled 2019-09-06: qty 1

## 2019-09-06 MED ORDER — FENTANYL CITRATE (PF) 100 MCG/2ML IJ SOLN
INTRAMUSCULAR | Status: DC | PRN
  Administered 2019-09-06 (×2): 25 ug via INTRAVENOUS

## 2019-09-06 MED ORDER — ONDANSETRON HCL 4 MG/2ML IJ SOLN
4.0000 mg | Freq: Four times a day (QID) | INTRAMUSCULAR | Status: DC | PRN
  Administered 2019-09-06: 4 mg via INTRAVENOUS

## 2019-09-06 MED ORDER — MORPHINE SULFATE (PF) 2 MG/ML IV SOLN
2.0000 mg | Freq: Once | INTRAVENOUS | Status: AC
  Administered 2019-09-06: 2 mg via INTRAVENOUS

## 2019-09-06 MED ORDER — NITROGLYCERIN 0.4 MG SL SUBL: 0.4000 mg | SUBLINGUAL_TABLET | SUBLINGUAL | Status: DC | PRN

## 2019-09-06 MED ORDER — FLUTICASONE PROPIONATE 50 MCG/ACT NA SUSP
1.0000 | Freq: Every day | NASAL | Status: DC | PRN
  Filled 2019-09-06: qty 16

## 2019-09-06 MED ORDER — CLOPIDOGREL BISULFATE 75 MG PO TABS: 75.0000 mg | ORAL_TABLET | Freq: Every day | ORAL | Status: DC

## 2019-09-06 MED ORDER — IOHEXOL 350 MG/ML SOLN
INTRAVENOUS | Status: DC | PRN
  Administered 2019-09-06: 90 mL via INTRA_ARTERIAL

## 2019-09-06 MED ORDER — TAMSULOSIN HCL 0.4 MG PO CAPS
0.4000 mg | ORAL_CAPSULE | Freq: Every evening | ORAL | Status: DC
  Administered 2019-09-06: 0.4 mg via ORAL
  Filled 2019-09-06: qty 1

## 2019-09-06 MED ORDER — FENTANYL CITRATE (PF) 100 MCG/2ML IJ SOLN
INTRAMUSCULAR | Status: AC
  Filled 2019-09-06: qty 2

## 2019-09-06 MED ORDER — MORPHINE SULFATE (PF) 2 MG/ML IV SOLN
INTRAVENOUS | Status: AC
  Filled 2019-09-06: qty 1

## 2019-09-06 MED ORDER — MIDAZOLAM HCL 2 MG/2ML IJ SOLN
INTRAMUSCULAR | Status: AC
  Filled 2019-09-06: qty 2

## 2019-09-06 MED ORDER — AMLODIPINE BESYLATE 2.5 MG PO TABS
2.5000 mg | ORAL_TABLET | Freq: Every evening | ORAL | Status: DC
  Administered 2019-09-06: 2.5 mg via ORAL
  Filled 2019-09-06: qty 1

## 2019-09-06 MED ORDER — ADULT MULTIVITAMIN W/MINERALS CH
1.0000 | ORAL_TABLET | Freq: Every day | ORAL | Status: DC
  Administered 2019-09-07: 1 via ORAL
  Filled 2019-09-06: qty 1

## 2019-09-06 MED ORDER — HEPARIN SODIUM (PORCINE) 1000 UNIT/ML IJ SOLN
INTRAMUSCULAR | Status: DC | PRN
  Administered 2019-09-06: 9000 [IU] via INTRAVENOUS

## 2019-09-06 MED ORDER — SODIUM CHLORIDE 0.9 % IV SOLN
INTRAVENOUS | Status: AC
  Administered 2019-09-06: 100 mL/h via INTRAVENOUS

## 2019-09-06 MED ORDER — LABETALOL HCL 5 MG/ML IV SOLN: 10.0000 mg | INTRAVENOUS | Status: AC | PRN

## 2019-09-06 MED ORDER — MIDAZOLAM HCL 2 MG/2ML IJ SOLN
INTRAMUSCULAR | Status: DC | PRN
  Administered 2019-09-06: 2 mg via INTRAVENOUS

## 2019-09-06 MED ORDER — HEPARIN (PORCINE) IN NACL 1000-0.9 UT/500ML-% IV SOLN
INTRAVENOUS | Status: AC
  Filled 2019-09-06: qty 500

## 2019-09-06 MED ORDER — SODIUM CHLORIDE 0.9 % WEIGHT BASED INFUSION
3.0000 mL/kg/h | INTRAVENOUS | Status: DC
  Administered 2019-09-06: 3 mL/kg/h via INTRAVENOUS

## 2019-09-06 MED ORDER — ESCITALOPRAM OXALATE 10 MG PO TABS
10.0000 mg | ORAL_TABLET | Freq: Every day | ORAL | Status: DC
  Administered 2019-09-07: 10 mg via ORAL
  Filled 2019-09-06: qty 1

## 2019-09-06 MED ORDER — HEPARIN (PORCINE) IN NACL 1000-0.9 UT/500ML-% IV SOLN
INTRAVENOUS | Status: DC | PRN
  Administered 2019-09-06 (×4): 500 mL

## 2019-09-06 MED ORDER — NITROGLYCERIN 1 MG/10 ML FOR IR/CATH LAB
INTRA_ARTERIAL | Status: DC | PRN
  Administered 2019-09-06 (×2): 200 ug via INTRACORONARY

## 2019-09-06 MED ORDER — SODIUM CHLORIDE 0.9% FLUSH: 3.0000 mL | Freq: Two times a day (BID) | INTRAVENOUS | Status: DC

## 2019-09-06 MED ORDER — ASPIRIN 81 MG PO CHEW: 81.0000 mg | CHEWABLE_TABLET | ORAL | Status: DC

## 2019-09-06 MED ORDER — HYDRALAZINE HCL 20 MG/ML IJ SOLN: 10.0000 mg | INTRAMUSCULAR | Status: AC | PRN

## 2019-09-06 MED ORDER — HEPARIN (PORCINE) IN NACL 1000-0.9 UT/500ML-% IV SOLN
INTRAVENOUS | Status: AC
  Filled 2019-09-06: qty 1500

## 2019-09-06 MED ORDER — SODIUM CHLORIDE 0.9 % IV SOLN
250.0000 mL | INTRAVENOUS | Status: DC | PRN
  Administered 2019-09-06: 250 mL via INTRAVENOUS

## 2019-09-06 SURGICAL SUPPLY — 24 items
BALLN SAPPHIRE 2.0X20 (BALLOONS) ×2
BALLN SAPPHIRE ~~LOC~~ 3.0X18 (BALLOONS) ×1 IMPLANT
BALLOON SAPPHIRE 2.0X20 (BALLOONS) IMPLANT
CATH INFINITI 5FR JL4 (CATHETERS) ×1 IMPLANT
CATH MACH1 8FR AL.75  90CM (CATHETERS) ×2
CATH MACH1 8FR AL.75 90CM (CATHETERS) IMPLANT
CATH MAMBA FLEX 135 (CATHETERS) ×1 IMPLANT
CATH OPTICROSS HD (CATHETERS) ×1 IMPLANT
CATH TRAPPER 6-8F (CATHETERS) ×1 IMPLANT
KIT ENCORE 26 ADVANTAGE (KITS) ×1 IMPLANT
KIT HEART LEFT (KITS) ×3 IMPLANT
KIT HEMO VALVE WATCHDOG (MISCELLANEOUS) ×1 IMPLANT
PACK CARDIAC CATHETERIZATION (CUSTOM PROCEDURE TRAY) ×2 IMPLANT
SHEATH BRITE TIP 8FR 35CM (SHEATH) ×1 IMPLANT
SHEATH PINNACLE 5F 10CM (SHEATH) ×1 IMPLANT
SHEATH PROBE COVER 6X72 (BAG) ×1 IMPLANT
SLED PULL BACK IVUS (MISCELLANEOUS) ×1 IMPLANT
STENT SYNERGY XD 2.50X32 (Permanent Stent) IMPLANT
SYNERGY XD 2.50X32 (Permanent Stent) ×2 IMPLANT
TRANSDUCER W/STOPCOCK (MISCELLANEOUS) ×3 IMPLANT
TUBING CIL FLEX 10 FLL-RA (TUBING) ×3 IMPLANT
WIRE ASAHI PROWATER 180CM (WIRE) ×1 IMPLANT
WIRE EMERALD 3MM-J .035X150CM (WIRE) ×1 IMPLANT
WIRE FIGHTER CROSSING 190CM (WIRE) ×1 IMPLANT

## 2019-09-06 NOTE — Interval H&P Note (Signed)
History and Physical Interval Note:  09/06/2019 1:25 PM  Jason Munoz  has presented today for surgery, with the diagnosis of coronary artery disease.  The various methods of treatment have been discussed with the patient and family. After consideration of risks, benefits and other options for treatment, the patient has consented to  Procedure(s): CORONARY CTO INTERVENTION (N/A) as a surgical intervention.  The patient's history has been reviewed, patient examined, no change in status, stable for surgery.  I have reviewed the patient's chart and labs.  Questions were answered to the patient's satisfaction.    Cath Lab Visit (complete for each Cath Lab visit)  Clinical Evaluation Leading to the Procedure:   ACS: No.  Non-ACS:    Anginal Classification: CCS III  Anti-ischemic medical therapy: Maximal Therapy (2 or more classes of medications)  Non-Invasive Test Results: Intermediate-risk stress test findings: cardiac mortality 1-3%/year  Prior CABG: No previous CABG       [name] [facility] 09/06/2019 1:25 PM

## 2019-09-06 NOTE — Progress Notes (Signed)
Site area: Left groin a 5 french arterial sheath was removed  Site Prior to Removal:  Level 0  Pressure Applied For 40 MINUTES    Bedrest Beginning at 1835pm  Manual:   Yes.    Patient Status During Pull:  stable  Post Pull Groin Site:  Level 0  Post Pull Instructions Given:  Yes.    Post Pull Pulses Present:  Yes.    Dressing Applied:  Yes.    Comments:

## 2019-09-06 NOTE — Progress Notes (Signed)
Site area: Right  Groin a 8 french arterial sheath was removed  Site Prior to Removal:  Level 0  Pressure Applied For 30 MINUTES    Bedrest Beginning at  1835p  Manual:   Yes.    Patient Status During Pull:  stable  Post Pull Groin Site:  Level 0  Post Pull Instructions Given:  Yes.    Post Pull Pulses Present:  Yes.    Dressing Applied:  Yes.    Comments:

## 2019-09-07 ENCOUNTER — Ambulatory Visit (HOSPITAL_BASED_OUTPATIENT_CLINIC_OR_DEPARTMENT_OTHER): Payer: 59

## 2019-09-07 ENCOUNTER — Encounter (HOSPITAL_COMMUNITY): Payer: Self-pay | Admitting: Cardiology

## 2019-09-07 DIAGNOSIS — I25110 Atherosclerotic heart disease of native coronary artery with unstable angina pectoris: Secondary | ICD-10-CM | POA: Diagnosis not present

## 2019-09-07 DIAGNOSIS — I714 Abdominal aortic aneurysm, without rupture: Secondary | ICD-10-CM | POA: Diagnosis not present

## 2019-09-07 DIAGNOSIS — I2510 Atherosclerotic heart disease of native coronary artery without angina pectoris: Secondary | ICD-10-CM

## 2019-09-07 DIAGNOSIS — E7800 Pure hypercholesterolemia, unspecified: Secondary | ICD-10-CM

## 2019-09-07 DIAGNOSIS — I209 Angina pectoris, unspecified: Secondary | ICD-10-CM | POA: Diagnosis not present

## 2019-09-07 DIAGNOSIS — E785 Hyperlipidemia, unspecified: Secondary | ICD-10-CM | POA: Diagnosis not present

## 2019-09-07 DIAGNOSIS — Z955 Presence of coronary angioplasty implant and graft: Secondary | ICD-10-CM

## 2019-09-07 DIAGNOSIS — I724 Aneurysm of artery of lower extremity: Secondary | ICD-10-CM | POA: Diagnosis not present

## 2019-09-07 DIAGNOSIS — G4733 Obstructive sleep apnea (adult) (pediatric): Secondary | ICD-10-CM | POA: Diagnosis not present

## 2019-09-07 LAB — BASIC METABOLIC PANEL WITH GFR
Anion gap: 8 (ref 5–15)
BUN: 19 mg/dL (ref 8–23)
CO2: 23 mmol/L (ref 22–32)
Calcium: 9 mg/dL (ref 8.9–10.3)
Chloride: 108 mmol/L (ref 98–111)
Creatinine, Ser: 1.44 mg/dL — ABNORMAL HIGH (ref 0.61–1.24)
GFR calc Af Amer: 60 mL/min — ABNORMAL LOW (ref >60)
GFR calc non Af Amer: 52 mL/min — ABNORMAL LOW (ref >60)
Glucose, Bld: 110 mg/dL — ABNORMAL HIGH (ref 70–99)
Potassium: 4.3 mmol/L (ref 3.5–5.1)
Sodium: 139 mmol/L (ref 135–145)

## 2019-09-07 LAB — CBC
HCT: 40.5 % (ref 39.0–52.0)
Hemoglobin: 13.4 g/dL (ref 13.0–17.0)
MCH: 29.8 pg (ref 26.0–34.0)
MCHC: 33.1 g/dL (ref 30.0–36.0)
MCV: 90 fL (ref 80.0–100.0)
Platelets: 205 K/uL (ref 150–400)
RBC: 4.5 MIL/uL (ref 4.22–5.81)
RDW: 15.7 % — ABNORMAL HIGH (ref 11.5–15.5)
WBC: 7.6 K/uL (ref 4.0–10.5)
nRBC: 0 % (ref 0.0–0.2)

## 2019-09-07 MED ORDER — ANGIOPLASTY BOOK
Freq: Once | Status: DC
  Filled 2019-09-07: qty 1

## 2019-09-07 MED ORDER — THE SENSUOUS HEART BOOK
Freq: Once | Status: DC
  Filled 2019-09-07: qty 1

## 2019-09-07 NOTE — Progress Notes (Signed)
CARDIAC REHAB PHASE I   PRE:  Rate/Rhythm: 64 SR  BP:  Sitting: 130/70      SaO2: 97 RA  MODE:  Ambulation: 400 ft   POST:  Rate/Rhythm: 89 SR  BP:  Sitting: 112/86    SaO2: 98 RA  Pt ambulated 489ft in hallway standby assist with steady gait. Pt denies CP or dizziness. Does c/o some SOB and R groin pain. Pt educated on importance of ASA, Plavix, and NTG. Pt given heart healthy diet. Pt states he has abstained from smoking for 3 months and plans to continue. Reviewed restrictions, site care, and exercise guidelines. Will refer to [facility]. Hopeful for d/c today.  [id number] Reynold Bowen, RN BSN 09/07/2019 10:58 AM

## 2019-09-07 NOTE — Progress Notes (Signed)
VASCULAR LAB PRELIMINARY  PRELIMINARY  PRELIMINARY  PRELIMINARY  Ultrasound of the right  completed.    Preliminary report:  See CV proc for preliminary results.  , , RVT 09/07/2019, 12:43 PM

## 2019-09-07 NOTE — Discharge Instructions (Signed)
No driving for 48 hours. No lifting over 5 lbs for 1 week. No sexual activity for 1 week. Keep procedure site clean & dry. If you notice increased pain, swelling, bleeding or pus, call/return!  You may shower, but no soaking baths/hot tubs/pools for 1 week.   

## 2019-09-07 NOTE — Discharge Summary (Signed)
Discharge Summary    Patient ID: Jason Munoz MRN: [id number]; DOB: August 23, 1955  Admit date: 09/06/2019 Discharge date: 09/07/2019  Primary Care Provider: Geoffry ParadiseAronson, Richard, MD  Primary Cardiologist: Peter SwazilandJordan, MD  Primary Electrophysiologist:  None   Discharge Diagnoses    Principal Problem:   Angina pectoris [facility](HCC) Active Problems:   Coronary artery disease   Hypercholesteremia   COPD (chronic obstructive pulmonary disease) with acute bronchitis (HCC)   Obstructive sleep apnea treated with continuous positive airway pressure (CPAP)   Abnormal nuclear stress test   Status post coronary artery stent placement    Diagnostic Studies/Procedures    Cath 09/06/2019  Prox LAD lesion is 35% stenosed.  Mid LAD lesion is 60% stenosed.  1st Mrg lesion is 40% stenosed.  Prox RCA to Mid RCA lesion is 100% stenosed.  Post intervention, there is a 0% residual stenosis.  A drug-eluting stent was successfully placed using a SYNERGY XD 2.50X32.   1. Successful CTO PCI of the mid RCA with DES x 1 and IVUS guidance  Plan: observe overnight . Anticipate DC in am. DAPT for one year.     Groin US 09/07/2019 Findings:   A mixed echogenic structure measuring approximately 1.8 cm x 2.0 cm is  visualized at the right groin with ultrasound characteristics of a  hematoma.     Summary: No evidence of pseudoaneurysm, AVF or DVT. Hematoma noted.  _____________   History of Present Illness     Jason Munoz is a 64 y.o. male with hx of CAD, hyperlipidemia, AAA, obstructive sleep apnea, GERD and the tobacco abuse. He underwent BMS to LAD in 2006. Nuclear stress test in 2014 was negative.Ultrasound obtained in 2020 showed AAA measuring at 3.4 cm. He stopped taking the losartan HCTZ and his abdominal pain went away. He was last seen by Dr. SwazilandJordan on 04/24/2019 at which time he was doing well. He had CT abdomen and pelvis with contrast on 08/03/2019, this revealed a stable 3.4 cm  infrarenal abdominal aortic aneurysm. Recommend follow-up ultrasound in 3 years. I last saw the patient on 08/04/2019 along with his wife. He was complaining of worsening dyspnea on exertion at the time. He quit smoking in April however has since noticing intermittent chest tightness across the entire chest.  Symptom was quite transient when compared to the previous angina in 2006.  He also complained of associated right abdominal discomfort.  I recommended echocardiogram and Myoview at the time.  Echocardiogram obtained on 08/24/2019 showed EF 60 to 65%, grade 1 DD.  Myoview obtained on 08/17/2019 showed EF 46%, small defect of mild severity present in the basal inferior and mid inferior location, this is consistent with ischemia, overall it was a low risk study.  I spoke with the patient and his wife, he continued to have intermittent chest pain and worsening dyspnea.  It was unclear to me whether his symptom was related to cardiac versus pulmonary etiology.  However was persistent symptom, I think it is prudent to rule out cardiac issue.  I started him on 2.5 mg daily of amlodipine for antianginal purposes.  I discussed the case with Dr. SwazilandJordan today, we recommend a left and right heart cath.  On exam, he has no lower extremity edema and his lung is clear.  He does have diminished breath sound in his lung consistent with history of smoking.  Hospital Course     Consultants: N/A  He underwent scheduled cardiac catheterization on 09/01/2019 which showed 35% prox LAD,  60% mid LAD, 100% prox RCA, 40% OM1.  The case was reviewed by Dr. Swaziland who felt he may benefit from a CTO PCI.  He returned on 09/06/2019 for staged intervention and underwent Synergy DES placement to RCA.  Postprocedure, he was placed on aspirin and Plavix.  He was seen on the following morning on 09/07/2019 at which time he complained of significant right groin tenderness.  On exam there was no pulsatile mass and no bruit.  Groin ultrasound was  obtained which showed 1.8 x 2.0 cm hematoma however no sign of pseudoaneurysm.  Patient is deemed stable for discharge from cardiology perspective.  He will continue to observe the size of the hematoma and to contact cardiology service if the hematoma enlarges.   Did the patient have an acute coronary syndrome (MI, NSTEMI, STEMI, etc) this admission?:  No                               Did the patient have a percutaneous coronary intervention (stent / angioplasty)?:  Yes.     Cath/PCI Registry Performance & Quality Measures: 1. Aspirin prescribed? - Yes 2. ADP Receptor Inhibitor (Plavix/Clopidogrel, Brilinta/Ticagrelor or Effient/Prasugrel) prescribed (includes medically managed patients)? - Yes 3. High Intensity Statin (Lipitor 40-80mg  or Crestor 20-40mg ) prescribed? - Yes 4. For EF <40%, was ACEI/ARB prescribed? - Not Applicable (EF >/= 40%) 5. For EF <40%, Aldosterone Antagonist (Spironolactone or Eplerenone) prescribed? - Not Applicable (EF >/= 40%) 6. Cardiac Rehab Phase II ordered? - Yes   _____________  Discharge Vitals Blood pressure 115/80, pulse 67, temperature 97.7 F (36.5 C), temperature source Oral, resp. rate (!) 28, height 5\' 6"  (1.676 m), weight 85.9 kg, SpO2 94 %.  Filed Weights   09/06/19 1224 09/07/19 0357  Weight: 86.2 kg 85.9 kg    Labs & Radiologic Studies    CBC Recent Labs    09/06/19 1306 09/07/19 0418  WBC 7.6 7.6  HGB 15.9 13.4  HCT 48.1 40.5  MCV 89.7 90.0  PLT 227 205   Basic Metabolic Panel Recent Labs    09/09/19 1306 09/07/19 0418  NA 140 139  K 4.8 4.3  CL 103 108  CO2 26 23  GLUCOSE 108* 110*  BUN 20 19  CREATININE 1.50* 1.44*  CALCIUM 10.0 9.0   Liver Function Tests No results for input(s): AST, ALT, ALKPHOS, BILITOT, PROT, ALBUMIN in the last 72 hours. No results for input(s): LIPASE, AMYLASE in the last 72 hours. High Sensitivity Troponin:   No results for input(s): TROPONINIHS in the last 720 hours.  BNP Invalid input(s):  POCBNP D-Dimer No results for input(s): DDIMER in the last 72 hours. Hemoglobin A1C No results for input(s): HGBA1C in the last 72 hours. Fasting Lipid Panel No results for input(s): CHOL, HDL, LDLCALC, TRIG, CHOLHDL, LDLDIRECT in the last 72 hours. Thyroid Function Tests No results for input(s): TSH, T4TOTAL, T3FREE, THYROIDAB in the last 72 hours.  Invalid input(s): FREET3 _____________  CARDIAC CATHETERIZATION  Result Date: 09/06/2019  Prox LAD lesion is 35% stenosed.  Mid LAD lesion is 60% stenosed.  1st Mrg lesion is 40% stenosed.  Prox RCA to Mid RCA lesion is 100% stenosed.  Post intervention, there is a 0% residual stenosis.  A drug-eluting stent was successfully placed using a SYNERGY XD 2.50X32.  1. Successful CTO PCI of the mid RCA with DES x 1 and IVUS guidance Plan: observe overnight . Anticipate DC in am.  DAPT for one year.   CARDIAC CATHETERIZATION  Addendum Date: 09/01/2019    Prox LAD lesion is 35% stenosed.  Mid LAD lesion is 60% stenosed.  Prox RCA to Mid RCA lesion is 100% stenosed.  1st Mrg lesion is 40% stenosed.  LV end diastolic pressure is normal.  1. Single vessel occlusive CAD involving the RCA. The prior stent in the proximal LAD is still patent. There is a borderline stenosis in the mid LAD 2. Normal LV filling pressures. 3. Normal right heart pressures 4. Normal cardiac output. Plan: based on findings today and recent stress test I feel his symptoms are related to the RCA occlusion. Symptoms have been present for 3-4 months. He is on good medical therapy. Options are to attempt CTO PCI versus increasing medical therapy. Will discuss with patient.   Result Date: 09/01/2019  Prox LAD lesion is 35% stenosed.  Mid LAD lesion is 60% stenosed.  Prox RCA to Mid RCA lesion is 100% stenosed.  1st Mrg lesion is 40% stenosed.  LV end diastolic pressure is normal.  1. Single vessel occlusive CAD involving the RCA. The prior stent in the proximal LAD is still  patent. There is a borderline stenosis in the mid LAD 2. Normal LV filling pressures. 3. Normal right heart pressures 4. Normal cardiac output. Plan: based on findings today and recent stress test I feel his symptoms are related to the RCA occlusion. Symptoms have been present for 3-4 months. He is on good medical therapy. Options are to attempt CTO PCI versus increasing medical therapy. Will discuss with patient.   VAS [place] GROIN PSEUDOANEURYSM  Result Date: 09/07/2019  ARTERIAL PSEUDOANEURYSM  Exam: Right groin Indications: Patient complains of groin pain and bruising. History: S/p catheterization. Comparison Study: No prior study on file Performing Technologist: Sherren Kerns RVS  Examination Guidelines: A complete evaluation includes B-mode imaging, spectral Doppler, color Doppler, and power Doppler as needed of all accessible portions of each vessel. Bilateral testing is considered an integral part of a complete examination. Limited examinations for reoccurring indications may be performed as noted.  Findings: A mixed echogenic structure measuring approximately 1.8 cm x 2.0 cm is visualized at the right groin with ultrasound characteristics of a hematoma.  Summary: No evidence of pseudoaneurysm, AVF or DVT. Hematoma noted.  Diagnosing physician: Waverly Ferrari MD Electronically signed by Waverly Ferrari MD on 09/07/2019 at 3:02:50 PM.   --------------------------------------------------------------------------------    Final    MYOCARDIAL PERFUSION IMAGING  Result Date: 08/17/2019  The left ventricular ejection fraction is mildly decreased (45-54%).  Nuclear stress EF: 46%.  There was no ST segment deviation noted during stress.  Defect 1: There is a small defect of mild severity present in the basal inferior and mid inferior location.  Findings consistent with ischemia.  This is a low risk study.  Low risk stress nuclear study with mild inferior wall ischemia and mildly reduced left  ventricular global systolic function.  ECHOCARDIOGRAM COMPLETE  Result Date: 08/24/2019    ECHOCARDIOGRAM REPORT   Patient Name:   Jason Munoz Date of Exam: 08/24/2019 Medical Rec #:  [id number]       Height:       66.0 in Accession #:    [id number]      Weight:       192.0 lb Date of Birth:  1955/11/23       BSA:          1.965 m Patient Age:    44 years  BP:           110/78 mmHg Patient Gender: M               HR:           65 bpm. Exam Location:  Outpatient Procedure: 2D Echo Indications:    Dyspnea R06.00  History:        Patient has no prior history of Echocardiogram examinations.                 Previous Myocardial Infarction and CAD; Risk                 Factors:Hypertension.  Sonographer:    Thurman Coyer RDCS (AE) Referring Phys: [phone]    Sonographer Comments: No subcostal window. IMPRESSIONS  1. Left ventricular ejection fraction, by estimation, is 60 to 65%. The left ventricle has normal function. The left ventricle has no regional wall motion abnormalities. Left ventricular diastolic parameters are consistent with Grade I diastolic dysfunction (impaired relaxation).  2. Right ventricular systolic function is normal. The right ventricular size is normal.  3. The mitral valve is grossly normal. Trivial mitral valve regurgitation. No evidence of mitral stenosis.  4. The aortic valve is tricuspid. Aortic valve regurgitation is not visualized. No aortic stenosis is present. FINDINGS  Left Ventricle: Left ventricular ejection fraction, by estimation, is 60 to 65%. The left ventricle has normal function. The left ventricle has no regional wall motion abnormalities. The left ventricular internal cavity size was normal in size. There is  no left ventricular hypertrophy. Left ventricular diastolic parameters are consistent with Grade I diastolic dysfunction (impaired relaxation). Right Ventricle: The right ventricular size is normal. No increase in right ventricular wall thickness. Right  ventricular systolic function is normal. Left Atrium: Left atrial size was normal in size. Right Atrium: Right atrial size was normal in size. Pericardium: There is no evidence of pericardial effusion. Presence of pericardial fat pad. Mitral Valve: The mitral valve is grossly normal. Trivial mitral valve regurgitation. No evidence of mitral valve stenosis. Tricuspid Valve: The tricuspid valve is grossly normal. Tricuspid valve regurgitation is trivial. No evidence of tricuspid stenosis. Aortic Valve: The aortic valve is tricuspid. Aortic valve regurgitation is not visualized. No aortic stenosis is present. Pulmonic Valve: The pulmonic valve was grossly normal. Pulmonic valve regurgitation is not visualized. No evidence of pulmonic stenosis. Aorta: The aortic root and ascending aorta are structurally normal, with no evidence of dilitation. Venous: The inferior vena cava was not well visualized. IAS/Shunts: The atrial septum is grossly normal.  LEFT VENTRICLE PLAX 2D LVIDd:         3.50 cm     Diastology LVIDs:         2.50 cm     LV e' lateral:   7.83 cm/s LV PW:         1.00 cm     LV E/e' lateral: 6.7 LV IVS:        1.00 cm     LV e' medial:    6.20 cm/s LVOT diam:     2.10 cm     LV E/e' medial:  8.4 LV SV:         43 LV SV Index:   22 LVOT Area:     3.46 cm  LV Volumes (MOD) LV vol d, MOD A2C: 54.1 ml LV vol d, MOD A4C: 63.2 ml LV vol s, MOD A2C: 26.7 ml LV vol s, MOD A4C: 28.2 ml LV SV  MOD A2C:     27.4 ml LV SV MOD A4C:     63.2 ml LV SV MOD BP:      31.8 ml RIGHT VENTRICLE RV S prime:     11.30 cm/s TAPSE (M-mode): 1.8 cm LEFT ATRIUM             Index       RIGHT ATRIUM          Index LA diam:        3.00 cm 1.53 cm/m  RA Area:     9.03 cm LA Vol (A2C):   31.9 ml 16.23 ml/m RA Volume:   17.10 ml 8.70 ml/m LA Vol (A4C):   34.9 ml 17.76 ml/m LA Biplane Vol: 34.8 ml 17.71 ml/m  AORTIC VALVE LVOT Vmax:   68.50 cm/s LVOT Vmean:  42.700 cm/s LVOT VTI:    0.125 m  AORTA Ao Root diam: 3.30 cm Ao Asc diam:  3.40  cm MITRAL VALVE               TRICUSPID VALVE MV Area (PHT): 3.72 cm    TR Peak grad:   6.8 mmHg MV Decel Time: 204 msec    TR Vmax:        130.00 cm/s MV E velocity: 52.30 cm/s MV A velocity: 72.00 cm/s  SHUNTS MV E/A ratio:  0.73        Systemic VTI:  0.12 m                            Systemic Diam: 2.10 cm Lennie Odor MD Electronically signed by Lennie Odor MD Signature Date/Time: 08/24/2019/12:37:12 PM    Final    Disposition   Pt is being discharged home today in good condition.  Follow-up Plans & Appointments     Follow-up Information    Azalee Course, [place] Follow up on 09/26/2019.   Specialties: Cardiology, Radiology Why: 11:15AM. Contact information: [address] [place] [address] [phone]        [place], Peter M, MD Follow up on 11/06/2019.   Specialty: Cardiology Why: 10:40AM. Cardiology visit Contact information: [address] [place] [address] [phone]              Discharge Instructions    Amb Referral to Cardiac Rehabilitation   Complete by: As directed    Will send Cardiac Rehab Phase 2 referral to [facility]   Diagnosis: Coronary Stents   After initial evaluation and assessments completed: Virtual Based Care may be provided alone or in conjunction with Phase 2 Cardiac Rehab based on patient barriers.: Yes   Diet - low sodium heart healthy   Complete by: As directed    Discharge instructions   Complete by: As directed    No driving for 48 hours. No lifting over 5 lbs for 1 week. No sexual activity for 1 week. Keep procedure site clean & dry. If you notice increased pain, swelling, bleeding or pus, call/return!  You may shower, but no soaking baths/hot tubs/pools for 1 week.   Increase activity slowly   Complete by: As directed       Discharge Medications   Allergies as of 09/07/2019   No Known Allergies     Medication List    TAKE these medications   amLODipine 2.5 MG tablet Commonly known as: NORVASC Take  1 tablet (2.5 mg total) by mouth daily. What changed: when to take  this   aspirin 81 MG chewable tablet Chew 81 mg by mouth daily.   atorvastatin 80 MG tablet Commonly known as: Lipitor Take 1 tablet (80 mg total) by mouth daily.   clopidogrel 75 MG tablet Commonly known as: Plavix Take 1 tablet (75 mg total) by mouth daily.   escitalopram 10 MG tablet Commonly known as: LEXAPRO Take 10 mg by mouth daily.   fluticasone 50 MCG/ACT nasal spray Commonly known as: FLONASE Place 1-2 sprays into both nostrils daily as needed (allergies.).   metoprolol succinate 25 MG 24 hr tablet Commonly known as: TOPROL-XL Take 25 mg by mouth every evening.   multivitamin with minerals Tabs tablet Take 1 tablet by mouth daily with lunch.   nitroGLYCERIN 0.4 MG SL tablet Commonly known as: NITROSTAT Place 1 tablet (0.4 mg total) under the tongue every 5 (five) minutes as needed for chest pain (Up to 3 doses. If chest pain persistent after the 3rd dose, call 911).   pantoprazole 40 MG tablet Commonly known as: PROTONIX Take 40 mg by mouth 2 (two) times daily.   tamsulosin 0.4 MG Caps capsule Commonly known as: FLOMAX Take 0.4 mg by mouth every evening.   valACYclovir 500 MG tablet Commonly known as: VALTREX Take 500 mg by mouth daily.          Outstanding Labs/Studies   N/A  Duration of Discharge Encounter   Greater than 30 minutes including physician time.  Ramond Dial, PA 09/07/2019, 3:13 PM

## 2019-09-07 NOTE — Progress Notes (Signed)
Progress Note  Patient Name: Jason Munoz Date of Encounter: 09/07/2019  [facility] Cardiologist: Peter Swaziland, MD   Subjective   Denies any CP or SOB.   Inpatient Medications    Scheduled Meds: . amLODipine  2.5 mg Oral QPM  . angioplasty book   Does not apply Once  . aspirin  81 mg Oral Daily  . atorvastatin  80 mg Oral Daily  . clopidogrel  75 mg Oral Daily  . escitalopram  10 mg Oral Daily  . melatonin  3 mg Oral QHS  . metoprolol succinate  25 mg Oral QPM  . multivitamin with minerals  1 tablet Oral Q lunch  . pantoprazole  40 mg Oral BID  . sodium chloride flush  3 mL Intravenous Q12H  . sodium chloride flush  3 mL Intravenous Q12H  . sodium chloride flush  3 mL Intravenous Q12H  . tamsulosin  0.4 mg Oral QPM  . the sensuous heart book   Does not apply Once  . valACYclovir  500 mg Oral Daily   Continuous Infusions: . sodium chloride     PRN Meds: sodium chloride, acetaminophen, fluticasone, nitroGLYCERIN, ondansetron (ZOFRAN) IV, sodium chloride flush   Vital Signs    Vitals:   09/06/19 2333 09/07/19 0000 09/07/19 0357 09/07/19 0756  BP: 115/86 113/84 95/77 115/80  Pulse: 88 90 73 62  Resp: 14 14 14 10   Temp: 98 F (36.7 C)  97.6 F (36.4 C) 97.7 F (36.5 C)  TempSrc: Oral  Oral Oral  SpO2: 93% 94% 92% 98%  Weight:   85.9 kg   Height:        Intake/Output Summary (Last 24 hours) at 09/07/2019 0805 Last data filed at 09/07/2019 0700 Gross per 24 hour  Intake 671.67 ml  Output 350 ml  Net 321.67 ml   Last 3 Weights 09/07/2019 09/06/2019 09/01/2019  Weight (lbs) 189 lb 5.7 oz 190 lb 190 lb  Weight (kg) 85.89 kg 86.183 kg 86.183 kg      Telemetry    NSR without ventricular ectopy - Personally Reviewed  ECG    NSR without significant ST-T wave changes - Personally Reviewed  Physical Exam   GEN: No acute distress.   Neck: No JVD Cardiac: RRR, no murmurs, rubs, or gallops.  Respiratory: Clear to auscultation bilaterally. GI: Soft,  nontender, non-distended. R groin soft, no pulsatile mass MS: No edema; No deformity. Neuro:  Nonfocal  Psych: Normal affect   Labs    High Sensitivity Troponin:  No results for input(s): TROPONINIHS in the last 720 hours.    Chemistry Recent Labs  Lab 09/01/19 1041 09/06/19 1306 09/07/19 0418  NA 143 140 139  K 4.2 4.8 4.3  CL  --  103 108  CO2  --  26 23  GLUCOSE  --  108* 110*  BUN  --  20 19  CREATININE  --  1.50* 1.44*  CALCIUM  --  10.0 9.0  GFRNONAA  --  49* 52*  GFRAA  --  57* 60*  ANIONGAP  --  11 8     Hematology Recent Labs  Lab 09/01/19 1041 09/06/19 1306 09/07/19 0418  WBC  --  7.6 7.6  RBC  --  5.36 4.50  HGB 13.3 15.9 13.4  HCT 39.0 48.1 40.5  MCV  --  89.7 90.0  MCH  --  29.7 29.8  MCHC  --  33.1 33.1  RDW  --  15.6* 15.7*  PLT  --  227 205    BNPNo results for input(s): BNP, PROBNP in the last 168 hours.   DDimer No results for input(s): DDIMER in the last 168 hours.   Radiology    CARDIAC CATHETERIZATION  Result Date: 09/06/2019  Prox LAD lesion is 35% stenosed.  Mid LAD lesion is 60% stenosed.  1st Mrg lesion is 40% stenosed.  Prox RCA to Mid RCA lesion is 100% stenosed.  Post intervention, there is a 0% residual stenosis.  A drug-eluting stent was successfully placed using a SYNERGY XD 2.50X32.  1. Successful CTO PCI of the mid RCA with DES x 1 and IVUS guidance Plan: observe overnight . Anticipate DC in am. DAPT for one year.    Cardiac Studies   Cath 09/01/2019  Prox LAD lesion is 35% stenosed.  Mid LAD lesion is 60% stenosed.  Prox RCA to Mid RCA lesion is 100% stenosed.  1st Mrg lesion is 40% stenosed.  LV end diastolic pressure is normal.   1. Single vessel occlusive CAD involving the RCA. The prior stent in the proximal LAD is still patent. There is a borderline stenosis in the mid LAD 2. Normal LV filling pressures. 3. Normal right heart pressures 4. Normal cardiac output.  Plan: based on findings today and  recent stress test I feel his symptoms are related to the RCA occlusion. Symptoms have been present for 3-4 months. He is on good medical therapy. Options are to attempt CTO PCI versus increasing medical therapy. Will discuss with patient.    Cath 09/06/2019  Prox LAD lesion is 35% stenosed.  Mid LAD lesion is 60% stenosed.  1st Mrg lesion is 40% stenosed.  Prox RCA to Mid RCA lesion is 100% stenosed.  Post intervention, there is a 0% residual stenosis.  A drug-eluting stent was successfully placed using a SYNERGY XD 2.50X32.   1. Successful CTO PCI of the mid RCA with DES x 1 and IVUS guidance  Plan: observe overnight . Anticipate DC in am. DAPT for one year.     Patient Profile     64 y.o. male with PMH of CAD, HLD and AAA presented for staged CTO PCI. Last cath on 7/16 showed occluded RCA, Dr. Swaziland recommended CTO PCI of RCA.   Assessment & Plan    1. CAD:   - myoview 08/17/2019 low risk with EF 46%, small defect of mild severity in the basal inferior and mid inferior location consistent with ischemia, overall low risk study  - initially planned medical therapy, however due to persistent dyspnea on exertion and chest pain, arranged for cardiac cath  - cath 7/16 showed 35% prox LAD, 60% mid LAD, 100% prox RCA, 40% OM1.   - cath 7/21 CTO PCI 09/06/2019 Synergy XD 2.5 x32 mm DES to RCA.   - ASA and plavix for 1 year  - significant R groin tenderness, however no sign of pulsatile mass, if no improvement, may consider groin [place]. Otherwise, ambulate with cardiac rehab today, if amble to ambulate without chest pain, he can be discharged.   2. HLD: on lipitor  3. AAA: CT obtained 08/03/2019 showed a stable 3.4 cm infrarenal abdominal aortic aneurysm, recommend follow-up ultrasound in 3 years.   For questions or updates, please contact [facility] Please consult [email or web address] for contact info under        Signed, Azalee Course, PA  09/07/2019, 8:05 AM

## 2019-09-16 ENCOUNTER — Other Ambulatory Visit: Payer: Self-pay | Admitting: Physician Assistant

## 2019-09-26 ENCOUNTER — Other Ambulatory Visit: Payer: Self-pay

## 2019-09-26 ENCOUNTER — Ambulatory Visit: Payer: 59 | Admitting: Physician Assistant

## 2019-09-26 ENCOUNTER — Encounter: Payer: Self-pay | Admitting: Physician Assistant

## 2019-09-26 VITALS — BP 122/96 | HR 58 | Ht 66.0 in | Wt 190.0 lb

## 2019-09-26 DIAGNOSIS — I2510 Atherosclerotic heart disease of native coronary artery without angina pectoris: Secondary | ICD-10-CM | POA: Diagnosis not present

## 2019-09-26 DIAGNOSIS — E785 Hyperlipidemia, unspecified: Secondary | ICD-10-CM | POA: Diagnosis not present

## 2019-09-26 DIAGNOSIS — R0600 Dyspnea, unspecified: Secondary | ICD-10-CM | POA: Diagnosis not present

## 2019-09-26 DIAGNOSIS — I714 Abdominal aortic aneurysm, without rupture, unspecified: Secondary | ICD-10-CM

## 2019-09-26 NOTE — Patient Instructions (Addendum)
Medication Instructions:   DISCONTINUE Amlodipine  *If you need a refill on your cardiac medications before your next appointment, please call your pharmacy*  Lab Work: NONE ordered at this time of appointment   If you have labs (blood work) drawn today and your tests are completely normal, you will receive your results only by: Marland Kitchen MyChart Message (if you have MyChart) OR . A paper copy in the mail If you have any lab test that is abnormal or we need to change your treatment, we will call you to review the results.  Testing/Procedures: You have been referred to [facility]  Follow-Up: At [facility], you and your health needs are our priority.  As part of our continuing mission to provide you with exceptional heart care, we have created designated Provider Care Teams.  These Care Teams include your primary Cardiologist (physician) and Advanced Practice Providers (APPs -  Physician Assistants and Nurse Practitioners) who all work together to provide you with the care you need, when you need it.  Your next appointment:   Follow up as scheduled-11/06/2019 at 10:40AM   The format for your next appointment:   In Person  Provider:   Peter Swaziland, MD  Other Instructions

## 2019-09-26 NOTE — Progress Notes (Signed)
Cardiology Office Note:    Date:  09/28/2019   ID:  Jason QuillSteven L Munoz, DOB 12-19-1955, MRN [id number]  PCP:  Geoffry ParadiseAronson, Richard, MD  [facility] Cardiologist:  Peter SwazilandJordan, MD  [facility] Electrophysiologist:  None   Referring MD: Geoffry ParadiseAronson, Richard, MD   Chief Complaint  Patient presents with  . Shortness of Breath  . Chest Pain    tingling    History of Present Illness:    Jason Munoz is a 64 y.o. male with a hx of CAD, hyperlipidemia, AAA, obstructive sleep apnea, GERD and tobacco abuse. He underwent BMS to LAD in 2006. Nuclear stress test in 2014 was negative.Ultrasound obtained in 2020 showed AAA measuring at 3.4 cm. He stopped taking the losartan HCTZ and his abdominal pain went away. He was last seen by Dr. SwazilandJordan on 04/24/2019 at which time he was doing well. He had CT abdomen and pelvis with contrast on 08/03/2019, this revealed a stable 3.4 cm infrarenal abdominal aortic aneurysm. Recommend follow-up ultrasound in 3 years. I last saw the patient on 08/04/2019 along with his wife. He was complaining of worsening dyspnea on exertion at the time. He quit smoking in April however has since noticing intermittent chest tightness across the entire chest.  Symptom was quite transient when compared to the previous angina in 2006.  He also complained of associated right abdominal discomfort.  I recommended echocardiogram and Myoview at the time.  Echocardiogram obtained on 08/24/2019 showed EF 60 to 65%, grade 1 DD.  Myoview obtained on 08/17/2019 showed EF 46%, small defect of mild severity present in the basal inferior and mid inferior location, this is consistent with ischemia, overall it was a low risk study.  I last saw the patient on 08/28/2019 at which time he continued to have intermittent chest pain on the worsening dyspnea.  I discussed the case with Dr. SwazilandJordan and recommended left and right heart cath.  Cardiac catheterization performed on 09/01/2019 revealed single-vessel disease  with 100% proximal to mid RCA occlusion, patent proximal LAD stent, 60% mid LAD disease, 40% OM1 lesion.  Normal right heart pressure.  It was felt that his symptom may be related to RCA CTO, therefore he was brought back to the hospital on 09/06/2019 for CTO PCI.  He eventually received Synergy 2.5 x 32 mm DES to RCA.  Postprocedure, he was placed on aspirin and Plavix with instruction to continue this for 1 year.  Patient presents to post hospital follow-up today along with his wife.  Since the recent stent placement, he has not had any major chest discomfort.  Unfortunately he continued to have dyspnea on exertion.  He is compliant with aspirin and Plavix therapy.  I recommended referral to pulmonology service for evaluation of pulmonary component which can contribute to his shortness of breath.  Otherwise he has no lower extremity edema, orthopnea or PND.   Past Medical History:  Diagnosis Date  . AAA (abdominal aortic aneurysm) (HCC) 09/2018   3.4cm  . Allergy   . Anxiety   . Arthritis   . Colitis   . Coronary artery disease 12/06   stent LAD in 2006  . GERD (gastroesophageal reflux disease)   . HTN (hypertension)   . Hypercholesteremia   . Myocardial infarction (HCC)   . Sleep apnea    uses cpap  . Snoring   . Substance abuse (HCC)   . Umbilical hernia   . Ventral hernia     Past Surgical History:  Procedure Laterality  Date  . 2006    . APPENDECTOMY    . bilateral ankle fractures    . CARPAL TUNNEL RELEASE    . COLONOSCOPY    . CORONARY ANGIOGRAPHY N/A 09/06/2019   Procedure: CORONARY ANGIOGRAPHY;  Surgeon: Swaziland, Peter M, MD;  Location: [facility] INVASIVE CV LAB;  Service: Cardiovascular;  Laterality: N/A;  . CORONARY CTO INTERVENTION N/A 09/06/2019   Procedure: CORONARY CTO INTERVENTION;  Surgeon: Swaziland, Peter M, MD;  Location: [facility] INVASIVE CV LAB;  Service: Cardiovascular;  Laterality: N/A;  RCA  . ELBOW SURGERY Right   . EXTERNAL EAR SURGERY    . INTRAVASCULAR ULTRASOUND/IVUS  N/A 09/06/2019   Procedure: Intravascular Ultrasound/IVUS;  Surgeon: Swaziland, Peter M, MD;  Location: [facility] INVASIVE CV LAB;  Service: Cardiovascular;  Laterality: N/A;  . NECK SURGERY    . RIGHT/LEFT HEART CATH AND CORONARY ANGIOGRAPHY N/A 09/01/2019   Procedure: RIGHT/LEFT HEART CATH AND CORONARY ANGIOGRAPHY;  Surgeon: Swaziland, Peter M, MD;  Location: [facility] INVASIVE CV LAB;  Service: Cardiovascular;  Laterality: N/A;  . UPPER GASTROINTESTINAL ENDOSCOPY      Current Medications: Current Meds  Medication Sig  . aspirin 81 MG chewable tablet Chew 81 mg by mouth daily.   Marland Kitchen atorvastatin (LIPITOR) 80 MG tablet Take 1 tablet (80 mg total) by mouth daily.  . clopidogrel (PLAVIX) 75 MG tablet Take 1 tablet (75 mg total) by mouth daily.  Marland Kitchen escitalopram (LEXAPRO) 10 MG tablet Take 10 mg by mouth daily.   . fluticasone (FLONASE) 50 MCG/ACT nasal spray Place 1-2 sprays into both nostrils daily as needed (allergies.).   Marland Kitchen metoprolol succinate (TOPROL-XL) 25 MG 24 hr tablet Take 25 mg by mouth every evening.   . Multiple Vitamin (MULTIVITAMIN WITH MINERALS) TABS tablet Take 1 tablet by mouth daily with lunch.  . nitroGLYCERIN (NITROSTAT) 0.4 MG SL tablet Place 1 tablet (0.4 mg total) under the tongue every 5 (five) minutes as needed for chest pain (Up to 3 doses. If chest pain persistent after the 3rd dose, call 911).  . pantoprazole (PROTONIX) 40 MG tablet Take 40 mg by mouth 2 (two) times daily.  . tamsulosin (FLOMAX) 0.4 MG CAPS capsule Take 0.4 mg by mouth every evening.   . valACYclovir (VALTREX) 500 MG tablet Take 500 mg by mouth daily.   . [DISCONTINUED] amLODipine (NORVASC) 2.5 MG tablet TAKE 1 TABLET BY MOUTH EVERY DAY     Allergies:   Patient has no known allergies.   Social History   Socioeconomic History  . Marital status: Married    Spouse name: Not on file  . Number of children: 3  . Years of education: HS  . Highest education level: Not on file  Occupational History  . Occupation: retired      Associate Professor: [employer]  Tobacco Use  . Smoking status: Former Smoker    Packs/day: 1.00    Types: Cigarettes    Quit date: 05/19/1996    Years since quitting: 23.3  . Smokeless tobacco: Former Clinical biochemist  . Vaping Use: Former  Substance and Sexual Activity  . Alcohol use: Never  . Drug use: No  . Sexual activity: Yes    Birth control/protection: None  Other Topics Concern  . Not on file  Social History Narrative   Lives at home with his wife.   Right-handed.   No caffeine use.   Social Determinants of Health   Financial Resource Strain:   . Difficulty of Paying Living Expenses:  Food Insecurity:   . Worried About Programme researcher, broadcasting/film/video in the Last Year:   . Barista in the Last Year:   Transportation Needs:   . Freight forwarder (Medical):   Marland Kitchen Lack of Transportation (Non-Medical):   Physical Activity:   . Days of Exercise per Week:   . Minutes of Exercise per Session:   Stress:   . Feeling of Stress :   Social Connections:   . Frequency of Communication with Friends and Family:   . Frequency of Social Gatherings with Friends and Family:   . Attends Religious Services:   . Active Member of Clubs or Organizations:   . Attends Banker Meetings:   Marland Kitchen Marital Status:      Family History: The patient's family history includes COPD in his father. There is no history of Stomach cancer, Rectal cancer, Esophageal cancer, Colon cancer, or Colon polyps.  ROS:   Please see the history of present illness.     All other systems reviewed and are negative.  EKGs/Labs/Other Studies Reviewed:    The following studies were reviewed today:  Cath 09/01/2019  Prox LAD lesion is 35% stenosed.  Mid LAD lesion is 60% stenosed.  Prox RCA to Mid RCA lesion is 100% stenosed.  1st Mrg lesion is 40% stenosed.  LV end diastolic pressure is normal.   1. Single vessel occlusive CAD involving the RCA. The prior stent in the proximal LAD is still  patent. There is a borderline stenosis in the mid LAD 2. Normal LV filling pressures. 3. Normal right heart pressures 4. Normal cardiac output.  Plan: based on findings today and recent stress test I feel his symptoms are related to the RCA occlusion. Symptoms have been present for 3-4 months. He is on good medical therapy. Options are to attempt CTO PCI versus increasing medical therapy. Will discuss with patient.    Cath 09/06/2019  Prox LAD lesion is 35% stenosed.  Mid LAD lesion is 60% stenosed.  1st Mrg lesion is 40% stenosed.  Prox RCA to Mid RCA lesion is 100% stenosed.  Post intervention, there is a 0% residual stenosis.  A drug-eluting stent was successfully placed using a SYNERGY XD 2.50X32.   1. Successful CTO PCI of the mid RCA with DES x 1 and IVUS guidance  Plan: observe overnight . Anticipate DC in am. DAPT for one year.      EKG:  EKG is ordered today.  The ekg ordered today demonstrates sinus bradycardia, heart rate 58 bpm.  Recent Labs: 09/07/2019: BUN 19; Creatinine, Ser 1.44; Hemoglobin 13.4; Platelets 205; Potassium 4.3; Sodium 139  Recent Lipid Panel No results found for: CHOL, TRIG, HDL, CHOLHDL, VLDL, LDLCALC, LDLDIRECT  Physical Exam:    VS:  BP (!) 122/96 (BP Location: Left Arm, Patient Position: Sitting, Cuff Size: Normal)   Pulse (!) 58   Ht 5\' 6"  (1.676 m)   Wt 190 lb (86.2 kg)   SpO2 99%   BMI 30.67 kg/m     Wt Readings from Last 3 Encounters:  09/26/19 190 lb (86.2 kg)  09/07/19 189 lb 5.7 oz (85.9 kg)  09/01/19 190 lb (86.2 kg)     GEN:  Well nourished, well developed in no acute distress HEENT: Normal NECK: No JVD; No carotid bruits LYMPHATICS: No lymphadenopathy CARDIAC: RRR, no murmurs, rubs, gallops RESPIRATORY:  Clear to auscultation without rales, wheezing or rhonchi  ABDOMEN: Soft, non-tender, non-distended MUSCULOSKELETAL:  No edema; No deformity  SKIN: Warm and dry NEUROLOGIC:  Alert and oriented x 3 PSYCHIATRIC:   Normal affect   ASSESSMENT:    1. Dyspnea, unspecified type   2. Coronary artery disease involving native coronary artery of native heart without angina pectoris   3. Hyperlipidemia LDL goal <70   4. AAA (abdominal aortic aneurysm) without rupture (HCC)    PLAN:    In order of problems listed above:  1. Dyspnea: Mainly occurs with physical exertion.  We will referred the patient to pulmonology service.  Recent cardiac catheterization showed normal right heart pressure, no sign volume overload.  He underwent stenting of RCA.  2. CAD: Recently underwent CTO PCI of RCA.  He has been compliant with aspirin and Plavix.  3. Hyperlipidemia: Continue Lipitor  4. AAA: Previous CTA of the abdomen obtained on 08/03/2019 showed a stable 3.4 cm infrarenal abdominal aortic aneurysm, recommend follow-up ultrasound in 3 years.   Medication Adjustments/Labs and Tests Ordered: Current medicines are reviewed at length with the patient today.  Concerns regarding medicines are outlined above.  Orders Placed This Encounter  Procedures  . Ambulatory referral to Pulmonology  . EKG 12-Lead   No orders of the defined types were placed in this encounter.   Patient Instructions  Medication Instructions:   DISCONTINUE Amlodipine  *If you need a refill on your cardiac medications before your next appointment, please call your pharmacy*  Lab Work: NONE ordered at this time of appointment   If you have labs (blood work) drawn today and your tests are completely normal, you will receive your results only by: Marland Kitchen MyChart Message (if you have MyChart) OR . A paper copy in the mail If you have any lab test that is abnormal or we need to change your treatment, we will call you to review the results.  Testing/Procedures: You have been referred to [facility]  Follow-Up: At [facility], you and your health needs are our priority.  As part of our continuing mission to provide you with exceptional  heart care, we have created designated Provider Care Teams.  These Care Teams include your primary Cardiologist (physician) and Advanced Practice Providers (APPs -  Physician Assistants and Nurse Practitioners) who all work together to provide you with the care you need, when you need it.  Your next appointment:   Follow up as scheduled-11/06/2019 at 10:40AM   The format for your next appointment:   In Person  Provider:   Peter Swaziland, MD  Other Instructions      Signed, Azalee Course, PA  09/28/2019 1:04 PM    [facility]

## 2019-10-06 ENCOUNTER — Ambulatory Visit: Payer: 59 | Admitting: Internal Medicine

## 2019-10-06 ENCOUNTER — Encounter: Payer: Self-pay | Admitting: Internal Medicine

## 2019-10-06 VITALS — BP 120/76 | HR 65 | Ht 65.5 in | Wt 192.4 lb

## 2019-10-06 DIAGNOSIS — R1030 Lower abdominal pain, unspecified: Secondary | ICD-10-CM | POA: Diagnosis not present

## 2019-10-06 DIAGNOSIS — K429 Umbilical hernia without obstruction or gangrene: Secondary | ICD-10-CM

## 2019-10-06 DIAGNOSIS — M791 Myalgia, unspecified site: Secondary | ICD-10-CM | POA: Diagnosis not present

## 2019-10-06 DIAGNOSIS — R159 Full incontinence of feces: Secondary | ICD-10-CM | POA: Diagnosis not present

## 2019-10-06 NOTE — Patient Instructions (Signed)
Take 2 extra strength Tylenol as needed for pain; you may also try using a heating pad.  Take 2 tablespoons of Metamucil in water or juice daily.  Please follow up as needed

## 2019-10-06 NOTE — Progress Notes (Signed)
HISTORY OF PRESENT ILLNESS:  Jason Munoz is a 64 y.o. male with multiple medical problems as listed below who was seen virtually July 2020 regarding epigastric pain, diarrhea, and prostration.  Colonoscopy and upper endoscopy were performed September 14, 2018.  Upper endoscopy was normal.  Colonoscopy, including intubation of the terminal ileum was normal except for sigmoid diverticulosis and internal hemorrhoids.  Patient was seen more recently by the GI nurse practitioner August 09, 2019 regarding left mid abdominal pain, small medical hernia, diastases recti, improving nausea, and fecal incontinence.  He has been taking IBgard as recommended.  He is continued on PPI.  He has not used fiber as recommended.  He presents today for follow-up with his wife.  He continues to have problems with daily left lower quadrant pain which can last for hours.  Describes it as sharp as well as spastic.  At times needs to lie down.  Not affected by meals, or bowel movements.  No nocturnal symptoms.  CT scan to evaluate this was unremarkable.  The nurse practitioner sent the patient to general surgery regarding his hernia and abdominal wall diastases.  The surgical therapy felt necessary.  Does continue with some infrequent incontinence.  He has been vaccinated against Covid.  He does have a history of left-sided back pain for which she has required steroid injection therapy.  REVIEW OF SYSTEMS:  All non-GI ROS negative unless otherwise stated in the HPI except for arthritis, fatigue  Past Medical History:  Diagnosis Date  . AAA (abdominal aortic aneurysm) (HCC) 09/2018   3.4cm  . Allergy   . Anxiety   . Arthritis   . Colitis   . Coronary artery disease 12/06   stent LAD in 2006  . GERD (gastroesophageal reflux disease)   . HTN (hypertension)   . Hypercholesteremia   . Myocardial infarction (HCC)   . Sleep apnea    uses cpap  . Snoring   . Substance abuse (HCC)   . Umbilical hernia   . Ventral hernia      Past Surgical History:  Procedure Laterality Date  . 2006    . APPENDECTOMY    . bilateral ankle fractures    . CARPAL TUNNEL RELEASE    . COLONOSCOPY    . CORONARY ANGIOGRAPHY N/A 09/06/2019   Procedure: CORONARY ANGIOGRAPHY;  Surgeon: Swaziland, Peter M, MD;  Location: [facility] INVASIVE CV LAB;  Service: Cardiovascular;  Laterality: N/A;  . CORONARY CTO INTERVENTION N/A 09/06/2019   Procedure: CORONARY CTO INTERVENTION;  Surgeon: Swaziland, Peter M, MD;  Location: [facility] INVASIVE CV LAB;  Service: Cardiovascular;  Laterality: N/A;  RCA  . ELBOW SURGERY Right   . EXTERNAL EAR SURGERY    . INTRAVASCULAR ULTRASOUND/IVUS N/A 09/06/2019   Procedure: Intravascular Ultrasound/IVUS;  Surgeon: Swaziland, Peter M, MD;  Location: [facility] INVASIVE CV LAB;  Service: Cardiovascular;  Laterality: N/A;  . NECK SURGERY    . RIGHT/LEFT HEART CATH AND CORONARY ANGIOGRAPHY N/A 09/01/2019   Procedure: RIGHT/LEFT HEART CATH AND CORONARY ANGIOGRAPHY;  Surgeon: Swaziland, Peter M, MD;  Location:  [facility] INVASIVE CV LAB;  Service: Cardiovascular;  Laterality: N/A;  . UPPER GASTROINTESTINAL ENDOSCOPY      Social History Jason Munoz  reports that he quit smoking about 23 years ago. His smoking use included cigarettes. He smoked 1.00 pack per day. He has quit using smokeless tobacco. He reports that he does not drink alcohol and does not use drugs.  family history includes COPD in his father.  No  Known Allergies     PHYSICAL EXAMINATION: Vital signs: BP 120/76   Pulse 65   Ht 5' 5.5" (1.664 m)   Wt 192 lb 6 oz (87.3 kg)   BMI 31.53 kg/m   Constitutional: generally well-appearing, no acute distress Psychiatric: alert and oriented x3, cooperative Eyes: extraocular movements intact, anicteric, conjunctiva pink Mouth: oral pharynx moist, no lesions Neck: supple no lymphadenopathy Cardiovascular: heart regular rate and rhythm, no murmur Lungs: clear to auscultation bilaterally Abdomen: soft, abdominal wall tenderness in the left  lower quadrant reproducing his pain, nondistended, no obvious ascites, no peritoneal signs, normal bowel sounds, no organomegaly Rectal: Omitted Extremities: no pain, cyanosis, or lower extremity edema bilaterally Skin: no lesions on visible extremities Neuro: No focal deficits.  Cranial nerves intact  ASSESSMENT:  1.  Musculoskeletal abdominal wall tenderness. 2.  Incidental umbilical hernia 3.  Occasional incontinence.  Normal colonoscopy last year 4.  Normal upper endoscopy last year   PLAN:  1.  Reassurance regarding his abdominal wall tenderness.  Recommend symptomatic therapy such as Tylenol or possibly apply warm heat.  If problems persist thereafter he may follow-up with his PCP for additional treatment 2.  Metamucil 2 tablespoons daily to improve consistency of stools and reduce the risk of incontinence 3.  Protective undergarments such as depends 4.  Routine GI follow-up as needed

## 2019-11-03 NOTE — Progress Notes (Signed)
Cardiology Office Note:    Date:  11/06/2019   ID:  Jason Munoz, DOB May 10, 1955, MRN [id number]  PCP:  Geoffry Paradise, MD  [facility] Cardiologist:   [place], MD  [facility] Electrophysiologist:  None   Referring MD: Geoffry Paradise, MD   Chief Complaint  Patient presents with  . Coronary Artery Disease    History of Present Illness:    Jason Munoz is a 64 y.o. male with a hx of CAD, hyperlipidemia, AAA, obstructive sleep apnea, GERD and tobacco abuse. He underwent BMS to LAD in 2006.   He was seen by Azalee Course PA-C on 08/04/2019. He was complaining of worsening dyspnea on exertion at the time. He quit smoking in April however has since noticing intermittent chest tightness across the entire chest.   Echocardiogram obtained on 08/24/2019 showed EF 60 to 65%, grade 1 DD.  Myoview obtained on 08/17/2019 showed EF 46%, small defect of mild severity present in the basal inferior and mid inferior location, this is consistent with ischemia, overall it was a low risk study. He continued to have symptoms so a  Cardiac catheterization was performed on 09/01/2019 revealed single-vessel disease with 100% proximal to mid RCA occlusion, patent proximal LAD stent, 60% mid LAD disease, 40% OM1 lesion.  Normal right heart pressure.  It was felt that his symptom may be related to RCA CTO, therefore he was brought back to the hospital on 09/06/2019 for CTO PCI.  He eventually received Synergy 2.5 x 32 mm DES to RCA.  Postprocedure, he was placed on aspirin and Plavix with instruction to continue this for 1 year.   Ultrasound obtained in 2020 showed AAA measuring at 3.4 cm.   CT abdomen and pelvis with contrast on 08/03/2019, this revealed a stable 3.4 cm infrarenal abdominal aortic aneurysm. Recommend follow-up ultrasound in 3 years.   On follow up today he is doing well. Still notes some SOB but not as bad as before. Is interested in seeing pulmonary given history of tobacco abuse. Is trying  to eat better but still eats sausage or baloney biscuits.   Past Medical History:  Diagnosis Date  . AAA (abdominal aortic aneurysm) (HCC) 09/2018   3.4cm  . Allergy   . Anxiety   . Arthritis   . Colitis   . Coronary artery disease 12/06   stent LAD in 2006  . GERD (gastroesophageal reflux disease)   . HTN (hypertension)   . Hypercholesteremia   . Myocardial infarction (HCC)   . Sleep apnea    uses cpap  . Snoring   . Substance abuse (HCC)   . Umbilical hernia   . Ventral hernia     Past Surgical History:  Procedure Laterality Date  . 2006    . APPENDECTOMY    . bilateral ankle fractures    . CARPAL TUNNEL RELEASE    . COLONOSCOPY    . CORONARY ANGIOGRAPHY N/A 09/06/2019   Procedure: CORONARY ANGIOGRAPHY;  Surgeon: Swaziland,  M, MD;  Location: [facility] INVASIVE CV LAB;  Service: Cardiovascular;  Laterality: N/A;  . CORONARY CTO INTERVENTION N/A 09/06/2019   Procedure: CORONARY CTO INTERVENTION;  Surgeon: Swaziland,  M, MD;  [facility] INVASIVE CV LAB;  Service: Cardiovascular;  Laterality: N/A;  RCA  . ELBOW SURGERY Right   . EXTERNAL EAR SURGERY    . INTRAVASCULAR ULTRASOUND/IVUS N/A 09/06/2019   Procedure: Intravascular Ultrasound/IVUS;  Surgeon: Swaziland,  M, MD;  Location: [facility] INVASIVE CV LAB;  Service: Cardiovascular;  Laterality: N/A;  . NECK SURGERY    . RIGHT/LEFT HEART CATH AND CORONARY ANGIOGRAPHY N/A 09/01/2019   Procedure: RIGHT/LEFT HEART CATH AND CORONARY ANGIOGRAPHY;  Surgeon: SwazilandJordan,  M, MD;  [facility] CV LAB;  Service: Cardiovascular;  Laterality: N/A;  . UPPER GASTROINTESTINAL ENDOSCOPY      Current Medications: Current Meds  Medication Sig  . aspirin 81 MG chewable tablet Chew 81 mg by mouth daily.   Marland Kitchen. atorvastatin (LIPITOR) 80 MG tablet Take 1 tablet (80 mg total) by mouth daily.  . clopidogrel (PLAVIX) 75 MG tablet Take 1 tablet (75 mg total) by mouth daily.  Marland Kitchen. escitalopram (LEXAPRO) 10 MG tablet Take 10 mg by mouth daily.   .  fluticasone (FLONASE) 50 MCG/ACT nasal spray Place 1-2 sprays into both nostrils daily as needed (allergies.).   Marland Kitchen. metoprolol succinate (TOPROL-XL) 25 MG 24 hr tablet Take 25 mg by mouth every evening.   . Multiple Vitamin (MULTIVITAMIN WITH MINERALS) TABS tablet Take 1 tablet by mouth daily with lunch.  . nitroGLYCERIN (NITROSTAT) 0.4 MG SL tablet Place 1 tablet (0.4 mg total) under the tongue every 5 (five) minutes as needed for chest pain (Up to 3 doses. If chest pain persistent after the 3rd dose, call 911).  . pantoprazole (PROTONIX) 40 MG tablet Take 40 mg by mouth 2 (two) times daily.  . tamsulosin (FLOMAX) 0.4 MG CAPS capsule Take 0.4 mg by mouth every evening.   . valACYclovir (VALTREX) 500 MG tablet Take 500 mg by mouth daily.      Allergies:   Patient has no known allergies.   Social History   Socioeconomic History  . Marital status: Married    Spouse name: Not on file  . Number of children: 3  . Years of education: HS  . Highest education level: Not on file  Occupational History  . Occupation: retired     Associate Professormployer: [employer]  Tobacco Use  . Smoking status: Former Smoker    Packs/day: 1.00    Types: Cigarettes    Quit date: 05/19/1996    Years since quitting: 23.4  . Smokeless tobacco: Former Clinical biochemistUser  Vaping Use  . Vaping Use: Former  Substance and Sexual Activity  . Alcohol use: Never  . Drug use: No  . Sexual activity: Yes    Birth control/protection: None  Other Topics Concern  . Not on file  Social History Narrative   Lives at home with his wife.   Right-handed.   No caffeine use.   Social Determinants of Health   Financial Resource Strain:   . Difficulty of Paying Living Expenses: Not on file  Food Insecurity:   . Worried About Programme researcher, broadcasting/film/videounning Out of Food in the Last Year: Not on file  . Ran Out of Food in the Last Year: Not on file  Transportation Needs:   . Lack of Transportation (Medical): Not on file  . Lack of Transportation (Non-Medical): Not on  file  Physical Activity:   . Days of Exercise per Week: Not on file  . Minutes of Exercise per Session: Not on file  Stress:   . Feeling of Stress : Not on file  Social Connections:   . Frequency of Communication with Friends and Family: Not on file  . Frequency of Social Gatherings with Friends and Family: Not on file  . Attends Religious Services: Not on file  . Active Member of Clubs or Organizations: Not on file  . Attends BankerClub or Organization Meetings: Not  on file  . Marital Status: Not on file     Family History: The patient's family history includes COPD in his father. There is no history of Stomach cancer, Rectal cancer, Esophageal cancer, Colon cancer, or Colon polyps.  ROS:   Please see the history of present illness.     All other systems reviewed and are negative.  EKGs/Labs/Other Studies Reviewed:    The following studies were reviewed today:  Cath 09/01/2019  Prox LAD lesion is 35% stenosed.  Mid LAD lesion is 60% stenosed.  Prox RCA to Mid RCA lesion is 100% stenosed.  1st Mrg lesion is 40% stenosed.  LV end diastolic pressure is normal.   1. Single vessel occlusive CAD involving the RCA. The prior stent in the proximal LAD is still patent. There is a borderline stenosis in the mid LAD 2. Normal LV filling pressures. 3. Normal right heart pressures 4. Normal cardiac output.  Plan: based on findings today and recent stress test I feel his symptoms are related to the RCA occlusion. Symptoms have been present for 3-4 months. He is on good medical therapy. Options are to attempt CTO PCI versus increasing medical therapy. Will discuss with patient.    Cath 09/06/2019  Prox LAD lesion is 35% stenosed.  Mid LAD lesion is 60% stenosed.  1st Mrg lesion is 40% stenosed.  Prox RCA to Mid RCA lesion is 100% stenosed.  Post intervention, there is a 0% residual stenosis.  A drug-eluting stent was successfully placed using a SYNERGY XD 2.50X32.   1.  Successful CTO PCI of the mid RCA with DES x 1 and IVUS guidance  Plan: observe overnight . Anticipate DC in am. DAPT for one year.      EKG:  EKG is not ordered today.   Recent Labs: 09/07/2019: BUN 19; Creatinine, Ser 1.44; Hemoglobin 13.4; Platelets 205; Potassium 4.3; Sodium 139  Recent Lipid Panel No results found for: CHOL, TRIG, HDL, CHOLHDL, VLDL, LDLCALC, LDLDIRECT   Dated 01/30/19: cholesterol 190, triglycerides 107, HDL 40, LDL 129.  Dated 08/03/19: A1c 5.8%.LFTs and TSH normal Dated 09/07/19: creatinine 1.44. otherwise BMET and CBC normal.  Physical Exam:    VS:  BP 125/83   Pulse 67   Temp (!) 95.7 F (35.4 C)   Ht 5\' 5"  (1.651 m)   Wt 191 lb 12.8 oz (87 kg)   SpO2 99%   BMI 31.92 kg/m     Wt Readings from Last 3 Encounters:  11/06/19 191 lb 12.8 oz (87 kg)  10/06/19 192 lb 6 oz (87.3 kg)  09/26/19 190 lb (86.2 kg)     GEN:  Well nourished, well developed in no acute distress HEENT: Normal NECK: No JVD; No carotid bruits LYMPHATICS: No lymphadenopathy CARDIAC: RRR, no murmurs, rubs, gallops RESPIRATORY:  Clear to auscultation without rales, wheezing or rhonchi  ABDOMEN: Soft, non-tender, non-distended MUSCULOSKELETAL:  No edema; No deformity  SKIN: Warm and dry NEUROLOGIC:  Alert and oriented x 3 PSYCHIATRIC:  Normal affect   ASSESSMENT:    1. Hypercholesteremia   2. Coronary artery disease involving native coronary artery of native heart without angina pectoris   3. AAA (abdominal aortic aneurysm) without rupture (HCC)   4. Dyspnea, unspecified type   5. Former tobacco use    PLAN:    In order of problems listed above:  1. CAD: s/p remote BMS of LAD. S/p CTO PCI of RCA in July 2021.  He has been compliant with aspirin and Plavix. No  angina. Dyspnea is improved.   2. Hyperlipidemia: Continue Lipitor 80 mg daily. Will update lipid panel. Goal LDL < 70. If not at goal consider PCSK 9 inhibitor.  3. AAA: Previous CTA of the abdomen obtained on  08/03/2019 showed a stable 3.4 cm infrarenal abdominal aortic aneurysm, recommend follow-up ultrasound in 3 years.  4.   Dyspnea with history of tobacco abuse. Follow up with pulmonary.   Medication Adjustments/Labs and Tests Ordered: Current medicines are reviewed at length with the patient today.  Concerns regarding medicines are outlined above.  Orders Placed This Encounter  Procedures  . Lipid panel   No orders of the defined types were placed in this encounter.   There are no Patient Instructions on file for this visit.   Signed,  Swaziland, MD  11/06/2019 11:08 AM    [facility]

## 2019-11-06 ENCOUNTER — Encounter: Payer: Self-pay | Admitting: Cardiology

## 2019-11-06 ENCOUNTER — Ambulatory Visit: Payer: 59 | Admitting: Cardiology

## 2019-11-06 ENCOUNTER — Other Ambulatory Visit: Payer: Self-pay

## 2019-11-06 VITALS — BP 125/83 | HR 67 | Temp 95.7°F | Ht 65.0 in | Wt 191.8 lb

## 2019-11-06 DIAGNOSIS — E7800 Pure hypercholesterolemia, unspecified: Secondary | ICD-10-CM

## 2019-11-06 DIAGNOSIS — I714 Abdominal aortic aneurysm, without rupture, unspecified: Secondary | ICD-10-CM

## 2019-11-06 DIAGNOSIS — R0600 Dyspnea, unspecified: Secondary | ICD-10-CM | POA: Diagnosis not present

## 2019-11-06 DIAGNOSIS — I2510 Atherosclerotic heart disease of native coronary artery without angina pectoris: Secondary | ICD-10-CM

## 2019-11-06 DIAGNOSIS — Z87891 Personal history of nicotine dependence: Secondary | ICD-10-CM

## 2019-11-06 LAB — LIPID PANEL
Chol/HDL Ratio: 3.4 ratio (ref 0.0–5.0)
Cholesterol, Total: 159 mg/dL (ref 100–199)
HDL: 47 mg/dL (ref >39)
LDL Chol Calc (NIH): 82 mg/dL (ref 0–99)
Triglycerides: 176 mg/dL — ABNORMAL HIGH (ref 0–149)
VLDL Cholesterol Cal: 30 mg/dL (ref 5–40)

## 2019-11-07 ENCOUNTER — Encounter: Payer: Self-pay | Admitting: Pulmonary Disease

## 2019-11-07 ENCOUNTER — Other Ambulatory Visit: Payer: Self-pay

## 2019-11-07 ENCOUNTER — Ambulatory Visit: Payer: 59 | Admitting: Pulmonary Disease

## 2019-11-07 VITALS — BP 116/74 | HR 65 | Temp 98.0°F | Ht 66.0 in | Wt 196.8 lb

## 2019-11-07 DIAGNOSIS — Z720 Tobacco use: Secondary | ICD-10-CM | POA: Diagnosis not present

## 2019-11-07 DIAGNOSIS — G4733 Obstructive sleep apnea (adult) (pediatric): Secondary | ICD-10-CM

## 2019-11-07 DIAGNOSIS — R0600 Dyspnea, unspecified: Secondary | ICD-10-CM

## 2019-11-07 DIAGNOSIS — Z9989 Dependence on other enabling machines and devices: Secondary | ICD-10-CM

## 2019-11-07 MED ORDER — ALBUTEROL SULFATE HFA 108 (90 BASE) MCG/ACT IN AERS
2.0000 | INHALATION_SPRAY | Freq: Four times a day (QID) | RESPIRATORY_TRACT | 6 refills | Status: DC | PRN
Start: 2019-11-07 — End: 2019-12-21

## 2019-11-07 NOTE — Patient Instructions (Signed)
-   We will refer you for a lung cancer screening CT Chest - We will schedule you for pulmonary function tests and call you with the results - Start albuterol inhaler 1-2 puffs as needed for shortness of breath, cough or wheezing - We will schedule you a follow up with one of our sleep medicine specialists for your obstructive sleep apnea

## 2019-11-07 NOTE — Progress Notes (Signed)
Synopsis: Referred by Geoffry Paradiseichard Aronson, MD for shortness of breath  Subjective:   PATIENT ID: Jason QuillSteven L Massiah GENDER: male DOB: [birth year], MRN: [id number]   HPI  Chief Complaint  Patient presents with  . Consult    DOE    Nickolas MadridSteven Cowdrey is a 64 year old male, former smoker with coronary artery disease status post PCI stent placement, hyperlipidemia, GERD and obstructive sleep apnea on CPAP who has been referred to pulmonary clinic for evaluation of shortness of breath on exertion.  He reports having shortness of breath for the past 6 months. He underwent cardiac evaluation in which he had a high risk stress test which led to heart cath and placement of a drug eluting stent to the mid RCA. He reports some improvement in his shortness of breath after this procedure, but continues to experience dyspnea. He reports intermittent cough productive of brown sputum. He has intermittent wheezing as well. He has a 30+ pack year history and recently quit smoking in 05/2019. He has history of sleep apnea, last sleep study in 2018. He does have a CPAP machine at home with a nasal pillow mask. He reports decreased use of his CPAP machine since July due to an air leak on his mask.   His father had COPD and was a long time smoker. He denies history of lung cancer in the family.   Past Medical History:  Diagnosis Date  . AAA (abdominal aortic aneurysm) (HCC) 09/2018   3.4cm  . Allergy   . Anxiety   . Arthritis   . Colitis   . Coronary artery disease 12/06   stent LAD in 2006  . GERD (gastroesophageal reflux disease)   . HTN (hypertension)   . Hypercholesteremia   . Myocardial infarction (HCC)   . Sleep apnea    uses cpap  . Snoring   . Substance abuse (HCC)   . Umbilical hernia   . Ventral hernia      Family History  Problem Relation Age of Onset  . COPD Father   . Stomach cancer Neg Hx   . Rectal cancer Neg Hx   . Esophageal cancer Neg Hx   . Colon cancer Neg Hx   . Colon polyps Neg  Hx      Social History   Socioeconomic History  . Marital status: Married    Spouse name: Not on file  . Number of children: 3  . Years of education: HS  . Highest education level: Not on file  Occupational History  . Occupation: retired     Associate Professormployer: [employer]  Tobacco Use  . Smoking status: Former Smoker    Packs/day: 1.00    Types: Cigarettes    Quit date: 06/04/2019    Years since quitting: 0.4  . Smokeless tobacco: Former Clinical biochemistUser  Vaping Use  . Vaping Use: Former  Substance and Sexual Activity  . Alcohol use: Never  . Drug use: No  . Sexual activity: Yes    Birth control/protection: None  Other Topics Concern  . Not on file  Social History Narrative   Lives at home with his wife.   Right-handed.   No caffeine use.   Social Determinants of Health   Financial Resource Strain:   . Difficulty of Paying Living Expenses: Not on file  Food Insecurity:   . Worried About Programme researcher, broadcasting/film/videounning Out of Food in the Last Year: Not on file  . Ran Out of Food in the Last Year: Not on file  Transportation Needs:   . Freight forwarder (Medical): Not on file  . Lack of Transportation (Non-Medical): Not on file  Physical Activity:   . Days of Exercise per Week: Not on file  . Minutes of Exercise per Session: Not on file  Stress:   . Feeling of Stress : Not on file  Social Connections:   . Frequency of Communication with Friends and Family: Not on file  . Frequency of Social Gatherings with Friends and Family: Not on file  . Attends Religious Services: Not on file  . Active Member of Clubs or Organizations: Not on file  . Attends Banker Meetings: Not on file  . Marital Status: Not on file  Intimate Partner Violence:   . Fear of Current or Ex-Partner: Not on file  . Emotionally Abused: Not on file  . Physically Abused: Not on file  . Sexually Abused: Not on file     No Known Allergies   Outpatient Medications Prior to Visit  Medication Sig Dispense Refill   . aspirin 81 MG chewable tablet Chew 81 mg by mouth daily.     Marland Kitchen atorvastatin (LIPITOR) 80 MG tablet Take 1 tablet (80 mg total) by mouth daily. 90 tablet 3  . clopidogrel (PLAVIX) 75 MG tablet Take 1 tablet (75 mg total) by mouth daily. 90 tablet 3  . escitalopram (LEXAPRO) 10 MG tablet Take 10 mg by mouth daily.     . fluticasone (FLONASE) 50 MCG/ACT nasal spray Place 1-2 sprays into both nostrils daily as needed (allergies.).     Marland Kitchen metoprolol succinate (TOPROL-XL) 25 MG 24 hr tablet Take 25 mg by mouth every evening.     . Multiple Vitamin (MULTIVITAMIN WITH MINERALS) TABS tablet Take 1 tablet by mouth daily with lunch.    . nitroGLYCERIN (NITROSTAT) 0.4 MG SL tablet Place 1 tablet (0.4 mg total) under the tongue every 5 (five) minutes as needed for chest pain (Up to 3 doses. If chest pain persistent after the 3rd dose, call 911). 25 tablet 3  . pantoprazole (PROTONIX) 40 MG tablet Take 40 mg by mouth 2 (two) times daily.    . tamsulosin (FLOMAX) 0.4 MG CAPS capsule Take 0.4 mg by mouth every evening.     . valACYclovir (VALTREX) 500 MG tablet Take 500 mg by mouth daily.      No facility-administered medications prior to visit.    Review of Systems  Constitutional: Negative for chills, diaphoresis, fever, malaise/fatigue and weight loss.  HENT: Negative for congestion, nosebleeds, sinus pain and sore throat.   Eyes: Negative for double vision.  Respiratory: Positive for cough, sputum production, shortness of breath and wheezing. Negative for hemoptysis.   Cardiovascular: Negative for chest pain, palpitations, orthopnea, claudication, leg swelling and PND.  Gastrointestinal: Negative for abdominal pain, blood in stool, diarrhea, heartburn, melena, nausea and vomiting.  Genitourinary: Negative for dysuria and hematuria.  Musculoskeletal: Negative for myalgias.  Skin: Negative for itching and rash.  Neurological: Negative for dizziness, weakness and headaches.  Endo/Heme/Allergies: Does  not bruise/bleed easily.  Psychiatric/Behavioral: Negative.    Objective:   Vitals:   11/07/19 1423  BP: 116/74  Pulse: 65  Temp: 98 F (36.7 C)  TempSrc: Oral  SpO2: 99%  Weight: 196 lb 12.8 oz (89.3 kg)  Height: 5\' 6"  (1.676 m)     Physical Exam Constitutional:      General: He is not in acute distress.    Appearance: Normal appearance. He is normal weight. He  is not ill-appearing.  HENT:     Head: Normocephalic and atraumatic.     Nose: Nose normal.     Mouth/Throat:     Mouth: Mucous membranes are moist.     Pharynx: Oropharynx is clear. No posterior oropharyngeal erythema.  Eyes:     General: No scleral icterus.    Conjunctiva/sclera: Conjunctivae normal.     Pupils: Pupils are equal, round, and reactive to light.  Cardiovascular:     Rate and Rhythm: Normal rate and regular rhythm.     Pulses: Normal pulses.     Heart sounds: Normal heart sounds. No murmur heard.   Pulmonary:     Effort: Pulmonary effort is normal. No respiratory distress.     Breath sounds: Normal breath sounds. No wheezing or rhonchi.  Abdominal:     General: Bowel sounds are normal. There is no distension.     Palpations: Abdomen is soft.  Musculoskeletal:     Right lower leg: No edema.     Left lower leg: No edema.  Skin:    General: Skin is warm and dry.  Neurological:     General: No focal deficit present.     Mental Status: He is alert and oriented to person, place, and time. Mental status is at baseline.     Gait: Gait normal.  Psychiatric:        Mood and Affect: Mood normal.        Behavior: Behavior normal.        Thought Content: Thought content normal.        Judgment: Judgment normal.     CBC    Component Value Date/Time   WBC 7.6 09/07/2019 0418   RBC 4.50 09/07/2019 0418   HGB 13.4 09/07/2019 0418   HGB 16.8 08/28/2019 1123   HCT 40.5 09/07/2019 0418   HCT 50.9 08/28/2019 1123   PLT 205 09/07/2019 0418   PLT 213 08/28/2019 1123   MCV 90.0 09/07/2019 0418    MCV 89 08/28/2019 1123   MCH 29.8 09/07/2019 0418   MCHC 33.1 09/07/2019 0418   RDW 15.7 (H) 09/07/2019 0418   RDW 15.9 (H) 08/28/2019 1123   LYMPHSABS 1.1 09/05/2018 1016   MONOABS 0.6 09/05/2018 1016   EOSABS 0.1 09/05/2018 1016   BASOSABS 0.1 09/05/2018 1016   BMP Latest Ref Rng & Units 09/07/2019 09/06/2019 09/01/2019  Glucose 70 - 99 mg/dL 784(O) 962(X) -  BUN 8 - 23 mg/dL 19 20 -  Creatinine 5.28 - 1.24 mg/dL 4.13(K) 4.40(N) -  BUN/Creat Ratio 10 - 24 - - -  Sodium 135 - 145 mmol/L 139 140 143  Potassium 3.5 - 5.1 mmol/L 4.3 4.8 4.2  Chloride 98 - 111 mmol/L 108 103 -  CO2 22 - 32 mmol/L 23 26 -  Calcium 8.9 - 10.3 mg/dL 9.0 02.7 -    Chest imaging: No recent chest imaging for review  PFT: No PFTs on file  Labs: Reviewed, as above.  Echo: 08/24/19 EF 60-65%, normal LV function with grade I diastolic dysfunction. RV systolic function and size are normal. No valvular dysfunction  Heart Catheterization: 09/06/19  Prox LAD lesion is 35% stenosed.  Mid LAD lesion is 60% stenosed.  1st Mrg lesion is 40% stenosed.  Prox RCA to Mid RCA lesion is 100% stenosed.  Post intervention, there is a 0% residual stenosis.  A drug-eluting stent was successfully placed using a SYNERGY XD 2.50X32.   1. Successful CTO PCI of the mid RCA  with DES x 1 and IVUS guidance  Assessment & Plan:   Dyspnea, unspecified type - Plan: Pulmonary Function Test  Obstructive sleep apnea treated with continuous positive airway pressure (CPAP)  Tobacco use - Plan: Ambulatory Referral for Lung Cancer Scre  Discussion: Elijiah Mickley is a 64 year old male, former smoker with a 30+ pack year smoking history who reports quitting in 05/2019. He has dyspnea on exertion mainly and an intermittent cough productive with brown sputum. He has occasional wheezing as well. Given his smoking history there is concern for obstructive lung disease. We will start him on as needed albuterol inhaler and obtain full  pulmonary function testing. We will determine further inhaler therapy at that time. He will be referred to our lung cancer screening program for scheduling of a low dose chest CT scan.   In regards to his obstructive sleep apnea, he has not been compliant over recent months due to a mask leak from his nasal pillow. He has been instructed to contact his DME company in regards to a mask fitting. He has also requested to transfer his care here to our sleep specialists. He will be scheduled for a follow up appointment.   He is to follow up in 3-4 months.  Melody Comas, MD [facility] Office: [phone]   Current Outpatient Medications:  .  aspirin 81 MG chewable tablet, Chew 81 mg by mouth daily. , Disp: , Rfl:  .  atorvastatin (LIPITOR) 80 MG tablet, Take 1 tablet (80 mg total) by mouth daily., Disp: 90 tablet, Rfl: 3 .  clopidogrel (PLAVIX) 75 MG tablet, Take 1 tablet (75 mg total) by mouth daily., Disp: 90 tablet, Rfl: 3 .  escitalopram (LEXAPRO) 10 MG tablet, Take 10 mg by mouth daily. , Disp: , Rfl:  .  fluticasone (FLONASE) 50 MCG/ACT nasal spray, Place 1-2 sprays into both nostrils daily as needed (allergies.). , Disp: , Rfl:  .  metoprolol succinate (TOPROL-XL) 25 MG 24 hr tablet, Take 25 mg by mouth every evening. , Disp: , Rfl:  .  Multiple Vitamin (MULTIVITAMIN WITH MINERALS) TABS tablet, Take 1 tablet by mouth daily with lunch., Disp: , Rfl:  .  nitroGLYCERIN (NITROSTAT) 0.4 MG SL tablet, Place 1 tablet (0.4 mg total) under the tongue every 5 (five) minutes as needed for chest pain (Up to 3 doses. If chest pain persistent after the 3rd dose, call 911)., Disp: 25 tablet, Rfl: 3 .  pantoprazole (PROTONIX) 40 MG tablet, Take 40 mg by mouth 2 (two) times daily., Disp: , Rfl:  .  tamsulosin (FLOMAX) 0.4 MG CAPS capsule, Take 0.4 mg by mouth every evening. , Disp: , Rfl:  .  valACYclovir (VALTREX) 500 MG tablet, Take 500 mg by mouth daily. , Disp: , Rfl:  .   albuterol (VENTOLIN HFA) 108 (90 Base) MCG/ACT inhaler, Inhale 2 puffs into the lungs every 6 (six) hours as needed for wheezing or shortness of breath., Disp: 8 g, Rfl: 6

## 2019-11-08 ENCOUNTER — Telehealth: Payer: Self-pay | Admitting: Cardiology

## 2019-11-08 NOTE — Telephone Encounter (Signed)
I think that is great. Please have him see pharmacy to discuss PCSK- 9 inhibitor like Repatha or Praluent.    [place] MD, [facility]

## 2019-11-08 NOTE — Telephone Encounter (Signed)
Patient returning a call from [place] yesterday about his blood work. He would like any nurse to go over his results with him today if possible.

## 2019-11-08 NOTE — Telephone Encounter (Signed)
Pt updated with lab results along with MD's recommendations. Pt voiced he would prefer to try injectable medication to help lower his cholesterol instead of another PO med.   Will route to MD to make aware.    Peter M [place], MD  11/07/2019 7:30 AM EDT     The following abnormalities are noted: LDL is 82 with goal of < 70 All other values are normal, stable or within acceptable limits. Medication changes / Follow up labs / Other changes or recommendations:  He is on maximal lipitor dose. Recommend adding Zetia 10 mg daily and repeat lipids and CMET in 3 months  Peter [place], MD 11/07/2019 7:28 AM

## 2019-11-10 NOTE — Telephone Encounter (Signed)
Spoke to patient Dr.Jordan's advice given.Scheduler will call back to schedule appointment with pharmacist to discuss PCSK-9 inhibitors.

## 2019-11-14 ENCOUNTER — Ambulatory Visit (INDEPENDENT_AMBULATORY_CARE_PROVIDER_SITE_OTHER): Payer: 59 | Admitting: Pharmacist Clinician (PhC)/ Clinical Pharmacy Specialist

## 2019-11-14 ENCOUNTER — Other Ambulatory Visit: Payer: Self-pay

## 2019-11-14 DIAGNOSIS — E7800 Pure hypercholesterolemia, unspecified: Secondary | ICD-10-CM | POA: Diagnosis not present

## 2019-11-14 NOTE — Progress Notes (Signed)
11/15/2019 Jason Munoz [birth year] [id number]   HPI:  Jason Munoz is a 64 y.o. male patient of Dr [place], who presents today for a lipid clinic evaluation.  See pertinent past medical history below.  He was seen by Azalee Course PA in June because of worsening DOE.  An echo and stress test were unremarkable, so when his symptoms continued he had a cardiac catheterization in July which revealed a 100% mid RCA occlusion, which was stented with DES.   He has been on high intensity statin for several months (atorvastatin 80 mg), but has been unable to reach LDL goal of < 70.  He is in the office today to discuss options for further lowering.       Past Medical History: ASCVD BMS to LAD in 2006, DES to RCA 2021  AAA Stable 3.4 cm infrarenal AAA,  GERD On pantoprazole 40 mb bid  Tobacco abuse Quit smoking in April 2021   Current Medications: atorvastatin 80  Cholesterol Goals: LDL < 70   Intolerant/previously tried:  Diet: grilled chicken, potatoes, cornbread, eats mostly home cooked meals, not much fried foods  Labs: 10/2019: TC 159, TG 176, HDL 47, LDL 82   Current Outpatient Medications  Medication Sig Dispense Refill  . albuterol (VENTOLIN HFA) 108 (90 Base) MCG/ACT inhaler Inhale 2 puffs into the lungs every 6 (six) hours as needed for wheezing or shortness of breath. 8 g 6  . aspirin 81 MG chewable tablet Chew 81 mg by mouth daily.     Marland Kitchen atorvastatin (LIPITOR) 80 MG tablet Take 1 tablet (80 mg total) by mouth daily. 90 tablet 3  . clopidogrel (PLAVIX) 75 MG tablet Take 1 tablet (75 mg total) by mouth daily. 90 tablet 3  . escitalopram (LEXAPRO) 10 MG tablet Take 10 mg by mouth daily.     . fluticasone (FLONASE) 50 MCG/ACT nasal spray Place 1-2 sprays into both nostrils daily as needed (allergies.).     Marland Kitchen metoprolol succinate (TOPROL-XL) 25 MG 24 hr tablet Take 25 mg by mouth every evening.     . Multiple Vitamin (MULTIVITAMIN WITH MINERALS) TABS tablet Take 1 tablet by mouth daily  with lunch.    . nitroGLYCERIN (NITROSTAT) 0.4 MG SL tablet Place 1 tablet (0.4 mg total) under the tongue every 5 (five) minutes as needed for chest pain (Up to 3 doses. If chest pain persistent after the 3rd dose, call 911). 25 tablet 3  . pantoprazole (PROTONIX) 40 MG tablet Take 40 mg by mouth 2 (two) times daily.    . tamsulosin (FLOMAX) 0.4 MG CAPS capsule Take 0.4 mg by mouth every evening.     . valACYclovir (VALTREX) 500 MG tablet Take 500 mg by mouth daily.      No current facility-administered medications for this visit.    No Known Allergies  Past Medical History:  Diagnosis Date  . AAA (abdominal aortic aneurysm) (HCC) 09/2018   3.4cm  . Allergy   . Anxiety   . Arthritis   . Colitis   . Coronary artery disease 12/06   stent LAD in 2006  . GERD (gastroesophageal reflux disease)   . HTN (hypertension)   . Hypercholesteremia   . Myocardial infarction (HCC)   . Sleep apnea    uses cpap  . Snoring   . Substance abuse (HCC)   . Umbilical hernia   . Ventral hernia     Blood pressure (!) 140/98, pulse 60, resp. rate 14, weight 194 lb 6.4  oz (88.2 kg), SpO2 98 %.   Hypercholesteremia Patent with ASCVD and LDL not to goal despite high intensity statin use.  Reviewed options for lowering LDL cholesterol, including ezetimibe, PCSK-9 inhibitors and bempedoic acid.  Discussed mechanisms of action, dosing, side effects and potential decreases in LDL cholesterol.  Answered all patient questions.  Based on this information, patient would prefer to start PCSK-9 inhibitor.  Will reach out to his pharmacy ([facility]) to get prescription information then start PA for either Repatha 140 or Praluent 150.  Patient aware that insurance to dictate which medication.  Repeat labs after 3 months.     Phillips Hay PharmD CPP [facility] [address] [place], [place] [address] [phone]

## 2019-11-14 NOTE — Patient Instructions (Addendum)
Your Results:             Your most recent labs Goal  Total Cholesterol 159 < 200  Triglycerides 176 < 150  HDL (happy/good cholesterol) 47 > 40  LDL (lousy/bad cholesterol 72 < 70     Medication changes:  We will start the paperwork to get Repatha or Praluent.  Once approved you will get a call from [facility] to let you know it can be picked up.  Lab orders:  We will repeat labs in about 3 months, after 5-6 doses of the medication.  We will send a lab order in the mail to you shortly before then.    Thank you for choosing [facility]

## 2019-11-15 ENCOUNTER — Encounter: Payer: Self-pay | Admitting: Pharmacist Clinician (PhC)/ Clinical Pharmacy Specialist

## 2019-11-15 ENCOUNTER — Other Ambulatory Visit: Payer: Self-pay | Admitting: *Deleted

## 2019-11-15 DIAGNOSIS — Z87891 Personal history of nicotine dependence: Secondary | ICD-10-CM

## 2019-11-15 NOTE — Assessment & Plan Note (Signed)
Patent with ASCVD and LDL not to goal despite high intensity statin use.  Reviewed options for lowering LDL cholesterol, including ezetimibe, PCSK-9 inhibitors and bempedoic acid.  Discussed mechanisms of action, dosing, side effects and potential decreases in LDL cholesterol.  Answered all patient questions.  Based on this information, patient would prefer to start PCSK-9 inhibitor.  Will reach out to his pharmacy ([facility]) to get prescription information then start PA for either Repatha 140 or Praluent 150.  Patient aware that insurance to dictate which medication.  Repeat labs after 3 months.

## 2019-11-15 NOTE — Progress Notes (Signed)
ch

## 2019-11-16 ENCOUNTER — Telehealth: Payer: Self-pay

## 2019-11-16 MED ORDER — PRALUENT 150 MG/ML ~~LOC~~ SOAJ: 150.0000 mg | SUBCUTANEOUS | 11 refills | Status: DC

## 2019-11-16 NOTE — Telephone Encounter (Signed)
lmomed the pt to start taking the praluent 150, rx sent, and instructed the pt to call back if the med is unaffordable

## 2019-11-28 ENCOUNTER — Other Ambulatory Visit: Payer: Self-pay

## 2019-11-28 ENCOUNTER — Telehealth: Payer: Self-pay | Admitting: Pulmonary Disease

## 2019-11-28 ENCOUNTER — Ambulatory Visit: Payer: 59 | Admitting: Pulmonary Disease

## 2019-11-28 ENCOUNTER — Encounter: Payer: Self-pay | Admitting: Pulmonary Disease

## 2019-11-28 VITALS — BP 120/82 | HR 80 | Temp 97.3°F | Ht 66.0 in | Wt 193.6 lb

## 2019-11-28 DIAGNOSIS — G4701 Insomnia due to medical condition: Secondary | ICD-10-CM

## 2019-11-28 DIAGNOSIS — G4730 Sleep apnea, unspecified: Secondary | ICD-10-CM | POA: Diagnosis not present

## 2019-11-28 DIAGNOSIS — G4710 Hypersomnia, unspecified: Secondary | ICD-10-CM | POA: Diagnosis not present

## 2019-11-28 NOTE — Progress Notes (Signed)
Jason Munoz    [id number]    [birth year]  Primary Care Physician:Aronson, Gerlene Burdock, MD  Referring Physician: Geoffry Paradise, MD [address] [place],  [place] [address]  Chief complaint:   Patient with a history of obstructive sleep apnea  HPI:  Has not been using CPAP regularly because the mask is uncomfortable Not functioning well Tried using it about 3 weeks ago  Diagnosed with mild obstructive sleep apnea Titrating CPAP Initially did feel that it helped although the effect was not very significant  His spouse was present, he was not snoring at the time  He has tried an oral device in the past and this was very uncomfortable as well, was snoring with it in place  Usually tries to go to bed about 11, falls asleep easily Final wake up time about 8 AM Wakes up about once or twice to use the bathroom Sleep is described as nonrestorative  Recently saw Dr. Francine Graven for dyspnea on exertion  Outpatient Encounter Medications as of 11/28/2019  Medication Sig  . albuterol (VENTOLIN HFA) 108 (90 Base) MCG/ACT inhaler Inhale 2 puffs into the lungs every 6 (six) hours as needed for wheezing or shortness of breath.  . Alirocumab (PRALUENT) 150 MG/ML SOAJ Inject 150 mg into the skin every 14 (fourteen) days.  Marland Kitchen aspirin 81 MG chewable tablet Chew 81 mg by mouth daily.   Marland Kitchen atorvastatin (LIPITOR) 80 MG tablet Take 1 tablet (80 mg total) by mouth daily.  . clopidogrel (PLAVIX) 75 MG tablet Take 1 tablet (75 mg total) by mouth daily.  Marland Kitchen escitalopram (LEXAPRO) 10 MG tablet Take 10 mg by mouth daily.   . fluticasone (FLONASE) 50 MCG/ACT nasal spray Place 1-2 sprays into both nostrils daily as needed (allergies.).   Marland Kitchen metoprolol succinate (TOPROL-XL) 25 MG 24 hr tablet Take 25 mg by mouth every evening.   . Multiple Vitamin (MULTIVITAMIN WITH MINERALS) TABS tablet Take 1 tablet by mouth daily with lunch.  . nitroGLYCERIN (NITROSTAT) 0.4 MG SL tablet Place 1 tablet (0.4 mg total)  under the tongue every 5 (five) minutes as needed for chest pain (Up to 3 doses. If chest pain persistent after the 3rd dose, call 911).  . pantoprazole (PROTONIX) 40 MG tablet Take 40 mg by mouth 2 (two) times daily.  . tamsulosin (FLOMAX) 0.4 MG CAPS capsule Take 0.4 mg by mouth every evening.   . valACYclovir (VALTREX) 500 MG tablet Take 500 mg by mouth daily.    No facility-administered encounter medications on file as of 11/28/2019.    Allergies as of 11/28/2019  . (No Known Allergies)    Past Medical History:  Diagnosis Date  . AAA (abdominal aortic aneurysm) (HCC) 09/2018   3.4cm  . Allergy   . Anxiety   . Arthritis   . Colitis   . Coronary artery disease 12/06   stent LAD in 2006  . GERD (gastroesophageal reflux disease)   . HTN (hypertension)   . Hypercholesteremia   . Myocardial infarction (HCC)   . Sleep apnea    uses cpap  . Snoring   . Substance abuse (HCC)   . Umbilical hernia   . Ventral hernia     Past Surgical History:  Procedure Laterality Date  . 2006    . APPENDECTOMY    . bilateral ankle fractures    . CARPAL TUNNEL RELEASE    . COLONOSCOPY    . CORONARY ANGIOGRAPHY N/A 09/06/2019   Procedure:  CORONARY ANGIOGRAPHY;  Surgeon: Swaziland, Peter M, MD;  Location: [facility] INVASIVE CV LAB;  Service: Cardiovascular;  Laterality: N/A;  . CORONARY CTO INTERVENTION N/A 09/06/2019   Procedure: CORONARY CTO INTERVENTION;  Surgeon: Swaziland, Peter M, MD;  Location: [facility] INVASIVE CV LAB;  Service: Cardiovascular;  Laterality: N/A;  RCA  . ELBOW SURGERY Right   . EXTERNAL EAR SURGERY    . INTRAVASCULAR ULTRASOUND/IVUS N/A 09/06/2019   Procedure: Intravascular Ultrasound/IVUS;  Surgeon: Swaziland, Peter M, MD;  Location: [facility] INVASIVE CV LAB;  Service: Cardiovascular;  Laterality: N/A;  . NECK SURGERY    . RIGHT/LEFT HEART CATH AND CORONARY ANGIOGRAPHY N/A 09/01/2019   Procedure: RIGHT/LEFT HEART CATH AND CORONARY ANGIOGRAPHY;  Surgeon: Swaziland, Peter M, MD;  Location: [facility] INVASIVE  CV LAB;  Service: Cardiovascular;  Laterality: N/A;  . UPPER GASTROINTESTINAL ENDOSCOPY      Family History  Problem Relation Age of Onset  . COPD Father   . Stomach cancer Neg Hx   . Rectal cancer Neg Hx   . Esophageal cancer Neg Hx   . Colon cancer Neg Hx   . Colon polyps Neg Hx     Social History   Socioeconomic History  . Marital status: Married    Spouse name: Not on file  . Number of children: 3  . Years of education: HS  . Highest education level: Not on file  Occupational History  . Occupation: retired     Associate Professor: [employer]  Tobacco Use  . Smoking status: Former Smoker    Packs/day: 1.00    Types: Cigarettes    Quit date: 06/04/2019    Years since quitting: 0.4  . Smokeless tobacco: Former Clinical biochemist  . Vaping Use: Former  Substance and Sexual Activity  . Alcohol use: Never  . Drug use: No  . Sexual activity: Yes    Birth control/protection: None  Other Topics Concern  . Not on file  Social History Narrative   Lives at home with his wife.   Right-handed.   No caffeine use.   Social Determinants of Health   Financial Resource Strain:   . Difficulty of Paying Living Expenses: Not on file  Food Insecurity:   . Worried About Programme researcher, broadcasting/film/video in the Last Year: Not on file  . Ran Out of Food in the Last Year: Not on file  Transportation Needs:   . Lack of Transportation (Medical): Not on file  . Lack of Transportation (Non-Medical): Not on file  Physical Activity:   . Days of Exercise per Week: Not on file  . Minutes of Exercise per Session: Not on file  Stress:   . Feeling of Stress : Not on file  Social Connections:   . Frequency of Communication with Friends and Family: Not on file  . Frequency of Social Gatherings with Friends and Family: Not on file  . Attends Religious Services: Not on file  . Active Member of Clubs or Organizations: Not on file  . Attends Banker Meetings: Not on file  . Marital Status: Not on  file  Intimate Partner Violence:   . Fear of Current or Ex-Partner: Not on file  . Emotionally Abused: Not on file  . Physically Abused: Not on file  . Sexually Abused: Not on file    Review of Systems  Constitutional: Positive for fatigue.  Respiratory: Positive for apnea and shortness of breath.   Psychiatric/Behavioral: Positive for sleep disturbance.    Vitals:  11/28/19 1141  BP: 120/82  Pulse: 80  Temp: (!) 97.3 F (36.3 C)  SpO2: 97%     Physical Exam Constitutional:      Appearance: He is obese.  HENT:     Head: Normocephalic and atraumatic.     Mouth/Throat:     Mouth: Mucous membranes are moist.     Pharynx: No oropharyngeal exudate.  Eyes:     General:        Right eye: No discharge.        Left eye: No discharge.  Cardiovascular:     Rate and Rhythm: Normal rate and regular rhythm.     Pulses: Normal pulses.     Heart sounds: No murmur heard.  No friction rub.  Pulmonary:     Effort: No respiratory distress.     Breath sounds: No stridor. No wheezing or rhonchi.  Musculoskeletal:     Cervical back: No rigidity or tenderness.  Neurological:     Mental Status: He is alert.  Psychiatric:        Mood and Affect: Mood normal.   No flowsheet data found.   Data Reviewed: Sleep study from 2018 reviewed  Assessment:  History of mild obstructive sleep apnea on CPAP therapy -Has not been very compliant with its use because it does not seem to be working as well  Daytime fatigue  Nonrestorative sleep  Obesity  Dyspnea on exertion  Pathophysiology of sleep disordered breathing discussed Treatment options discussed Risks with not treating sleep disordered breathing discussed  Plan/Recommendations: We will schedule the patient for home sleep study  We will follow-up in about 3 months  Encouraged to continue working on weight loss efforts  Call with significant concerns   Virl Diamond MD [facility] 11/28/2019, 12:08 PM  CC: Geoffry Paradise, MD

## 2019-11-28 NOTE — Patient Instructions (Signed)
History of obstructive sleep apnea  We will schedule you for home sleep study to check how severe the sleep apnea is  Call you with results and start you back on CPAP  Follow-up in 3 months  Call with significant concerns

## 2019-11-28 NOTE — Telephone Encounter (Signed)
Called and spoke with pt stating the info to him in regards to his upcoming CT scan and he verbalized understanding. Nothing further needed.

## 2019-12-01 ENCOUNTER — Ambulatory Visit (INDEPENDENT_AMBULATORY_CARE_PROVIDER_SITE_OTHER): Payer: 59 | Admitting: Pulmonary Disease

## 2019-12-01 ENCOUNTER — Other Ambulatory Visit: Payer: Self-pay

## 2019-12-01 DIAGNOSIS — R0600 Dyspnea, unspecified: Secondary | ICD-10-CM

## 2019-12-01 LAB — PULMONARY FUNCTION TEST
DL/VA % pred: 67 %
DL/VA: 2.9 ml/min/mmHg/L
DLCO cor % pred: 78 %
DLCO cor: 18.65 ml/min/mmHg
DLCO unc % pred: 78 %
DLCO unc: 18.65 ml/min/mmHg
FEF 25-75 Post: 2.83 L/sec
FEF 25-75 Pre: 2.63 L/sec
FEF2575-%Change-Post: 7 %
FEF2575-%Pred-Post: 114 %
FEF2575-%Pred-Pre: 106 %
FEV1-%Change-Post: 2 %
FEV1-%Pred-Post: 107 %
FEV1-%Pred-Pre: 105 %
FEV1-Post: 3.22 L
FEV1-Pre: 3.14 L
FEV1FVC-%Change-Post: 3 %
FEV1FVC-%Pred-Pre: 102 %
FEV6-%Change-Post: 0 %
FEV6-%Pred-Post: 107 %
FEV6-%Pred-Pre: 106 %
FEV6-Post: 4.03 L
FEV6-Pre: 4.01 L
FEV6FVC-%Change-Post: 0 %
FEV6FVC-%Pred-Post: 104 %
FEV6FVC-%Pred-Pre: 103 %
FVC-%Change-Post: 0 %
FVC-%Pred-Post: 102 %
FVC-%Pred-Pre: 102 %
FVC-Post: 4.05 L
FVC-Pre: 4.07 L
Post FEV1/FVC ratio: 80 %
Post FEV6/FVC ratio: 99 %
Pre FEV1/FVC ratio: 77 %
Pre FEV6/FVC Ratio: 98 %
RV % pred: 121 %
RV: 2.47 L
TLC % pred: 109 %
TLC: 6.65 L

## 2019-12-01 NOTE — Progress Notes (Signed)
PFT done today. 

## 2019-12-05 ENCOUNTER — Telehealth: Payer: Self-pay | Admitting: Pulmonary Disease

## 2019-12-05 DIAGNOSIS — R0602 Shortness of breath: Secondary | ICD-10-CM

## 2019-12-05 MED ORDER — UMECLIDINIUM-VILANTEROL 62.5-25 MCG/INH IN AEPB: 1.0000 | INHALATION_SPRAY | Freq: Every day | RESPIRATORY_TRACT | 6 refills | Status: DC

## 2019-12-05 NOTE — Telephone Encounter (Signed)
Pt's wife is requesting pt's PFT results.  Dr. Francine Graven - please advise. Thanks.

## 2019-12-05 NOTE — Telephone Encounter (Signed)
Called and spoke with the patient. His pulmonary function tests are within normal limits. He says the albuterol has helped minimally with his shortness of breath although it does help with his cough and mucous clearance.   Will send in prescription for anoro ellipta for his cough and dyspnea and monitor for improvement in his symptoms.   He has been seen in sleep medicine clinic and I informed him that his dyspnea should improve once the sleep apnea is better treated.

## 2019-12-06 ENCOUNTER — Ambulatory Visit (INDEPENDENT_AMBULATORY_CARE_PROVIDER_SITE_OTHER): Payer: 59 | Admitting: Acute Care

## 2019-12-06 ENCOUNTER — Ambulatory Visit (INDEPENDENT_AMBULATORY_CARE_PROVIDER_SITE_OTHER): Payer: 59

## 2019-12-06 ENCOUNTER — Encounter: Payer: Self-pay | Admitting: Acute Care

## 2019-12-06 ENCOUNTER — Other Ambulatory Visit: Payer: Self-pay

## 2019-12-06 DIAGNOSIS — Z87891 Personal history of nicotine dependence: Secondary | ICD-10-CM

## 2019-12-06 DIAGNOSIS — Z122 Encounter for screening for malignant neoplasm of respiratory organs: Secondary | ICD-10-CM | POA: Diagnosis not present

## 2019-12-06 NOTE — Patient Instructions (Signed)
Thank you for participating in the [facility]. It was our pleasure to meet you today. We will call you with the results of your scan within the next few days. Your scan will be assigned a Lung RADS category score by the physicians reading the scans.  This Lung RADS score determines follow up scanning.  See below for description of categories, and follow up screening recommendations. We will be in touch to schedule your follow up screening annually or based on recommendations of our providers. We will fax a copy of your scan results to your Primary Care Physician, or the physician who referred you to the program, to ensure they have the results. Please call the office if you have any questions or concerns regarding your scanning experience or results.  Our office number is [phone]. Please speak with Denise Phelps, RN. She is our Lung Cancer Screening RN. If she is unavailable when you call, please have the office staff send her a message. She will return your call at her earliest convenience. Remember, if your scan is normal, we will scan you annually as long as you continue to meet the criteria for the program. (Age 55-77, Current smoker or smoker who has quit within the last 15 years). If you are a smoker, remember, quitting is the single most powerful action that you can take to decrease your risk of lung cancer and other pulmonary, breathing related problems. We know quitting is hard, and we are here to help.  Please let us know if there is anything we can do to help you meet your goal of quitting. If you are a former smoker, congratulations. We are proud of you! Remain smoke free! Remember you can refer friends or family members through the number above.  We will screen them to make sure they meet criteria for the program. Thank you for helping us take better care of you by participating in Lung Screening.  Lung RADS Categories:  Lung RADS 1: no nodules  or definitely non-concerning nodules.  Recommendation is for a repeat annual scan in 12 months.  Lung RADS 2:  nodules that are non-concerning in appearance and behavior with a very low likelihood of becoming an active cancer. Recommendation is for a repeat annual scan in 12 months.  Lung RADS 3: nodules that are probably non-concerning , includes nodules with a low likelihood of becoming an active cancer.  Recommendation is for a 6-month repeat screening scan. Often noted after an upper respiratory illness. We will be in touch to make sure you have no questions, and to schedule your 6-month scan.  Lung RADS 4 A: nodules with concerning findings, recommendation is most often for a follow up scan in 3 months or additional testing based on our provider's assessment of the scan. We will be in touch to make sure you have no questions and to schedule the recommended 3 month follow up scan.  Lung RADS 4 B:  indicates findings that are concerning. We will be in touch with you to schedule additional diagnostic testing based on our provider's  assessment of the scan.   

## 2019-12-06 NOTE — Progress Notes (Signed)
Shared Decision Making Visit Lung Cancer Screening Program [phone])   Eligibility:  Age 64 y.o.  Pack Years Smoking History Calculation 48 pack year smoking history (# packs/per year x # years smoked)  Recent History of coughing up blood  no  Unexplained weight loss? no ( >Than 15 pounds within the last 6 months )  Prior History Lung / other cancer no (Diagnosis within the last 5 years already requiring surveillance chest CT Scans).  Smoking Status Former Smoker  Former Smokers: Years since quit: < 1 year  Quit Date: 05/2019  Visit Components:  Discussion included one or more decision making aids. yes  Discussion included risk/benefits of screening. yes  Discussion included potential follow up diagnostic testing for abnormal scans. yes  Discussion included meaning and risk of over diagnosis. yes  Discussion included meaning and risk of False Positives. yes  Discussion included meaning of total radiation exposure. yes  Counseling Included:  Importance of adherence to annual lung cancer LDCT screening. yes  Impact of comorbidities on ability to participate in the program. yes  Ability and willingness to under diagnostic treatment. yes  Smoking Cessation Counseling:  Current Smokers:   Discussed importance of smoking cessation. yes  Information about tobacco cessation classes and interventions provided to patient. yes  Patient provided with "ticket" for LDCT Scan. yes  Symptomatic Patient. no  Counseling NA  Diagnosis Code: Tobacco Use Z72.0  Asymptomatic Patient yes  Counseling (Intermediate counseling: > three minutes counseling) W2585  Former Smokers:   Discussed the importance of maintaining cigarette abstinence. yes  Diagnosis Code: Personal History of Nicotine Dependence. I77.824  Information about tobacco cessation classes and interventions provided to patient. Yes  Patient provided with "ticket" for LDCT Scan. yes  Written Order for Lung  Cancer Screening with LDCT placed in Epic. Yes (CT Chest Lung Cancer Screening Low Dose W/O CM) [id number] Z12.2-Screening of respiratory organs Z87.891-Personal history of nicotine dependence  This was a telephone visit, no vitals available  I spent 25 minutes of face to face time with Jason Munoz discussing the risks and benefits of lung cancer screening. We viewed a power point together that explained in detail the above noted topics. We took the time to pause the power point at intervals to allow for questions to be asked and answered to ensure understanding. We discussed that he had taken the single most powerful action possible to decrease his risk of developing lung cancer when he quit smoking. I counseled him to remain smoke free, and to contact me if he ever had the desire to smoke again so that I can provide resources and tools to help support the effort to remain smoke free. We discussed the time and location of the scan, and that either  Jason Miyamoto RN or I will call with the results within  24-48 hours of receiving them. He has my card and contact information in the event he needs to speak with me, in addition to a copy of the power point we reviewed as a resource. He verbalized understanding of all of the above and had no further questions upon leaving the office.     I explained to the patient that there has been a high incidence of coronary artery disease noted on these exams. I explained that this is a non-gated exam therefore degree or severity cannot be determined. This patient is currently on statin therapy. I have asked the patient to follow-up with their PCP regarding any incidental finding of coronary artery disease  and management with diet or medication as they feel is clinically indicated. The patient verbalized understanding of the above and had no further questions.     Bevelyn Ngo, NP 12/06/2019 11:01 AM

## 2019-12-13 ENCOUNTER — Telehealth: Payer: Self-pay | Admitting: Acute Care

## 2019-12-14 ENCOUNTER — Telehealth: Payer: Self-pay | Admitting: Pulmonary Disease

## 2019-12-14 DIAGNOSIS — Z87891 Personal history of nicotine dependence: Secondary | ICD-10-CM

## 2019-12-14 NOTE — Progress Notes (Signed)

## 2019-12-14 NOTE — Telephone Encounter (Signed)
LMTCB x1 for pt's wife, Andrey Campanile.

## 2019-12-14 NOTE — Telephone Encounter (Signed)
[facility] and there was no answer- LMTCB

## 2019-12-15 NOTE — Telephone Encounter (Signed)
Called and spoke to patient's spouse, Sandy(DPR). Andrey Campanile stated that patient is experiencing increased sob, pale, decreased energy and looks unwell. Sx have been present since last year but have worsened over the past two months.  Per Andrey Campanile, patient was gasping for air yesterday. Andrey Campanile used her sisters POC and applied 2L of oxygen. Patient was unable to notice a difference in breathing with oxygen.  Denied additional symptoms.   Patient is using Anoro 2-3 times daily. Andrey Campanile is aware that Anoro is to be used ONCE daily. Patient is not using albuterol, as patient thought Anoro replaced albuterol. Educated [place] on inhaler usage. I have recommended that patient be evaluated by ED based off of symptoms.  Andrey Campanile stated that she would first like CTLD results prior to ED. Message has been sent to Abigail Miyamoto, RN requesting that she contact [facility] with results.  Patient has had both covid vaccines.  Dr. Sherene Sires, please advise, as Dr. Francine Graven is unavailable.

## 2019-12-15 NOTE — Telephone Encounter (Signed)
Spoke with patient. He was having a rough day yesterday and is breathing much better. Denied any symptoms today. Advised him to call us back if anything changes.

## 2019-12-15 NOTE — Telephone Encounter (Signed)
This is Dr Lanora Manis pt and I don't see anything acute on CT that needs to be treated.  If breathing that bad and can't reach Dr Francine Graven for his input pt will need to go to ER

## 2019-12-15 NOTE — Telephone Encounter (Signed)
LMTC x 1 for Sandy to review pt's low dose Chest CT results.

## 2019-12-15 NOTE — Telephone Encounter (Signed)
Pt's wife Andrey Campanile informed of CT results per Kandice Robinsons, NP.  Pt's wife Andrey Campanile verbalized understanding.  Copy sent to PCP.  Order placed for 1 yr f/u CT. I advised Andrey Campanile that we will wait to hear recommendations from Dr Sherene Sires and will call back to advise. She asked that we call pt's cell number at [phone] because she is having phone difficulties.

## 2019-12-15 NOTE — Telephone Encounter (Signed)
See telephone note 12/14/19.

## 2019-12-21 ENCOUNTER — Encounter: Payer: Self-pay | Admitting: Family Medicine

## 2019-12-21 ENCOUNTER — Ambulatory Visit (INDEPENDENT_AMBULATORY_CARE_PROVIDER_SITE_OTHER): Payer: 59 | Admitting: Family Medicine

## 2019-12-21 VITALS — BP 138/96 | HR 86 | Ht 66.0 in | Wt 200.0 lb

## 2019-12-21 DIAGNOSIS — G4731 Primary central sleep apnea: Secondary | ICD-10-CM

## 2019-12-21 DIAGNOSIS — G4733 Obstructive sleep apnea (adult) (pediatric): Secondary | ICD-10-CM | POA: Diagnosis not present

## 2019-12-21 DIAGNOSIS — Z9989 Dependence on other enabling machines and devices: Secondary | ICD-10-CM

## 2019-12-21 NOTE — Progress Notes (Signed)
PATIENT: Jason Munoz DOB: [birth year]  REASON FOR VISIT: follow up HISTORY FROM: patient  Chief Complaint  Patient presents with  . Follow-up    rm 7  . Sleep Apnea     HISTORY OF PRESENT ILLNESS: Today 12/21/19 Jason Munoz is a 64 y.o. male here today for follow up for OSA on CPAP. He admits that he has not been very compliant with CPAP therapy. He has had two cardiac cath procedures with stents placed since last being seen. He reports that he continues to have shob. He is followed by pulmonology.  He admits that he has not used CPAP consistently.  He has had some health concerns that have prevented use.  He continues to snore.  He has a hard time going to sleep.   Compliance report dated 11/20/2019 through 12/19/2019 reveals that he used CPAP 15 of the past 30 days for compliance of 50%.  He used CPAP for greater than 4 hours 6 of the past 30 days for compliance of 20%.  Average usage on days used was 3 hours and 31 minutes.  Residual AHI was 14.4 on a set pressure of 11 cm of water and EPR of 3.  There was no significant leak noted.  Of note, he had 10.6 central events, 3.1 obstructive events.    HISTORY: (copied from Dr Dohmeier's note on 02/13/2019)  02-13-2019-  Jason Munoz is a 64 y.o. male , was seen for multiple Rv by Sherril Croon. He had undergone a sleep study on 11-20-2016 and diagnosed with mostly obstructive , complex sleep apnea at [facility] of only 7.9, RDI 9.3/h -  REM AHI 16.5/h. 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  I reviewed the patient's download and also he has used the machine on every also last 30 days he has not always been able to use it for longer than 2 hours, the average user time is 2 hours and 54 minutes on the, he uses CPAP with a set pressure of 11 cmH2O and 3 cm EPR, his residual AHI is 19.3 almost double the apnea count he had a baseline, and these are central dominant apneas.  My suspicion is that the patient has treatment emergent  central apnea and needs to be titrated to a BiPAP, air leakage seems not to be the problem. He is using a nasal pillow, which he likes.  In summer he broke 3 ribs , had to sleep in a recliner, for 3 weeks in september he went out of town without CPAP, has abdominal pain, and was less and less compliant.  He went with his wife to [place] before christmas.    2018- seen here as in a referral/ revisit  from Dr. Jacky Kindle for sleep consultation. [name] reports tinnitus, fatigue, snoring, erectile dysfunction and a history of high cholesterol. He has also been treated for GERD. Hypertension has been treated with Toprol and Cozaar. He takes a baby aspirin daily. He smokes one pack per day drinks socially alcohol but does not use nonprescription drugs or caffeine. He has been working for many years at [employer] and is a Education officer, museum. His 12 hour shift will begin at 4 AM and an's at 4:30 PM, other days he will start at 8 AM to 8 PM. He still feels that he gets nighttime sleep and rest.  Sleep habits are as follows:  Usually the patient will come home after work to eat supper with his  spouse, he may do some gardening or lawnmowing. And evening as usually spend watching TV. The patient had neck surgery, his wife hip surgery and there not participating in a regular exercise regimen at this time. Bedtime is usually around 10:30 during the week. The patient reports falling asleep rather promptly, he sleeps on his side, on one pillow. The bedroom is quiet, cool and dark with a ceiling fan. He has up to 3 bathroom breaks each night to go to the bathroom and he wakes up frequently for other reasons 2. He has hip pain, he wakes up and goes back to sleep but he wakes up frequently enough to sleep is fragmented. He rises at the morning at about 3 AM to go to his early shift. He feels that he gets his best sleep just before he has to rise at 3 AM. For this reason he will shift to the 8 to 8 shift  only. Currently he rises at 6:50 AM and off this way gets about 5-6 hours of sleep for sure. Most mornings he will be refreshed and restored when waking up. He does not wake with headaches, nausea dizziness chest pain or shortness of breath.  Sleep medical history and family sleep history:  One sister, father wears CPAP. No history of sleep walking or night terrors.   Social history: newlywed, lives with spouse, patient has grown children from a previous marriage.    REVIEW OF SYSTEMS: Out of a complete 14 system review of symptoms, the patient complains only of the following symptoms, shortness of breath, insomnia and all other reviewed systems are negative.  ESS: 13 FSS: 28  ALLERGIES: No Known Allergies  HOME MEDICATIONS: Outpatient Medications Prior to Visit  Medication Sig Dispense Refill  . Alirocumab (PRALUENT) 150 MG/ML SOAJ Inject 150 mg into the skin every 14 (fourteen) days. 2 mL 11  . aspirin 81 MG chewable tablet Chew 81 mg by mouth daily.     Marland Kitchen atorvastatin (LIPITOR) 80 MG tablet Take 1 tablet (80 mg total) by mouth daily. 90 tablet 3  . clopidogrel (PLAVIX) 75 MG tablet Take 1 tablet (75 mg total) by mouth daily. 90 tablet 3  . escitalopram (LEXAPRO) 10 MG tablet Take 10 mg by mouth daily.     . fluticasone (FLONASE) 50 MCG/ACT nasal spray Place 1-2 sprays into both nostrils daily as needed (allergies.).     Marland Kitchen metoprolol succinate (TOPROL-XL) 25 MG 24 hr tablet Take 25 mg by mouth every evening.     . Multiple Vitamin (MULTIVITAMIN WITH MINERALS) TABS tablet Take 1 tablet by mouth daily with lunch.    . nitroGLYCERIN (NITROSTAT) 0.4 MG SL tablet Place 1 tablet (0.4 mg total) under the tongue every 5 (five) minutes as needed for chest pain (Up to 3 doses. If chest pain persistent after the 3rd dose, call 911). 25 tablet 3  . pantoprazole (PROTONIX) 40 MG tablet Take 40 mg by mouth 2 (two) times daily.    . tamsulosin (FLOMAX) 0.4 MG CAPS capsule Take 0.4 mg by mouth  every evening.     . umeclidinium-vilanterol (ANORO ELLIPTA) 62.5-25 MCG/INH AEPB Inhale 1 puff into the lungs daily. 60 each 6  . valACYclovir (VALTREX) 500 MG tablet Take 500 mg by mouth daily.     Marland Kitchen albuterol (VENTOLIN HFA) 108 (90 Base) MCG/ACT inhaler Inhale 2 puffs into the lungs every 6 (six) hours as needed for wheezing or shortness of breath. 8 g 6   No facility-administered medications prior  to visit.    PAST MEDICAL HISTORY: Past Medical History:  Diagnosis Date  . AAA (abdominal aortic aneurysm) (HCC) 09/2018   3.4cm  . Allergy   . Anxiety   . Arthritis   . Colitis   . Coronary artery disease 12/06   stent LAD in 2006  . GERD (gastroesophageal reflux disease)   . HTN (hypertension)   . Hypercholesteremia   . Myocardial infarction (HCC)   . Sleep apnea    uses cpap  . Snoring   . Substance abuse (HCC)   . Umbilical hernia   . Ventral hernia     PAST SURGICAL HISTORY: Past Surgical History:  Procedure Laterality Date  . 2006    . APPENDECTOMY    . bilateral ankle fractures    . CARPAL TUNNEL RELEASE    . COLONOSCOPY    . CORONARY ANGIOGRAPHY N/A 09/06/2019   Procedure: CORONARY ANGIOGRAPHY;  Surgeon: SwazilandJordan, Peter M, MD;  [facility];  Service: Cardiovascular;  Laterality: N/A;  . CORONARY CTO INTERVENTION N/A 09/06/2019   Procedure: CORONARY CTO INTERVENTION;  Surgeon: SwazilandJordan, Peter M, MD;  [facility];  Service: Cardiovascular;  Laterality: N/A;  RCA  . ELBOW SURGERY Right   . EXTERNAL EAR SURGERY    . INTRAVASCULAR ULTRASOUND/IVUS N/A 09/06/2019   Procedure: Intravascular Ultrasound/IVUS;  Surgeon: SwazilandJordan, Peter M, MD;  [facility];  Service: Cardiovascular;  Laterality: N/A;  . NECK SURGERY    . RIGHT/LEFT HEART CATH AND CORONARY ANGIOGRAPHY N/A 09/01/2019   Procedure: RIGHT/LEFT HEART CATH AND CORONARY ANGIOGRAPHY;  Surgeon: SwazilandJordan, Peter M, MD;  [facility];  Service: Cardiovascular;   Laterality: N/A;  . UPPER GASTROINTESTINAL ENDOSCOPY      FAMILY HISTORY: Family History  Problem Relation Age of Onset  . COPD Father   . Stomach cancer Neg Hx   . Rectal cancer Neg Hx   . Esophageal cancer Neg Hx   . Colon cancer Neg Hx   . Colon polyps Neg Hx     SOCIAL HISTORY: Social History   Socioeconomic History  . Marital status: Married    Spouse name: Not on file  . Number of children: 3  . Years of education: HS  . Highest education level: Not on file  Occupational History  . Occupation: retired     Associate Professormployer: [employer]  Tobacco Use  . Smoking status: Former Smoker    Packs/day: 1.50    Years: 33.00    Pack years: 49.50    Types: Cigarettes    Quit date: 06/04/2019    Years since quitting: 0.5  . Smokeless tobacco: Former Clinical biochemistUser  Vaping Use  . Vaping Use: Former  Substance and Sexual Activity  . Alcohol use: Never  . Drug use: No  . Sexual activity: Yes    Birth control/protection: None  Other Topics Concern  . Not on file  Social History Narrative   Lives at home with his wife.   Right-handed.   No caffeine use.   Social Determinants of Health   Financial Resource Strain:   . Difficulty of Paying Living Expenses: Not on file  Food Insecurity:   . Worried About Programme researcher, broadcasting/film/videounning Out of Food in the Last Year: Not on file  . Ran Out of Food in the Last Year: Not on file  Transportation Needs:   . Lack of Transportation (Medical): Not on file  . Lack of Transportation (Non-Medical): Not on file  Physical Activity:   . Days of Exercise per Week: Not on file  . Minutes of Exercise per Session: Not on file  Stress:   . Feeling of Stress : Not on file  Social Connections:   . Frequency of Communication with Friends and Family: Not on file  . Frequency of Social Gatherings with Friends and Family: Not on file  . Attends Religious Services: Not on file  . Active Member of Clubs or Organizations: Not on file  . Attends Banker Meetings:  Not on file  . Marital Status: Not on file  Intimate Partner Violence:   . Fear of Current or Ex-Partner: Not on file  . Emotionally Abused: Not on file  . Physically Abused: Not on file  . Sexually Abused: Not on file     PHYSICAL EXAM  Vitals:   12/21/19 1318  BP: (!) 138/96  Pulse: 86  Weight: 200 lb (90.7 kg)  Height: 5\' 6"  (1.676 m)   Body mass index is 32.28 kg/m.  Generalized: Well developed, in no acute distress  Cardiology: normal rate and rhythm, no murmur noted Respiratory: clear to auscultation bilaterally  Neurological examination  Mentation: Alert oriented to time, place, history taking. Follows all commands speech and language fluent Cranial nerve II-XII: Pupils were equal round reactive to light. Extraocular movements were full, visual field were full  Motor: The motor testing reveals 5 over 5 strength of all 4 extremities. Good symmetric motor tone is noted throughout.  Gait and station: Gait is normal.    DIAGNOSTIC DATA (LABS, IMAGING, TESTING) - I reviewed patient records, labs, notes, testing and imaging myself where available.  No flowsheet data found.   Lab Results  Component Value Date   WBC 7.6 09/07/2019   HGB 13.4 09/07/2019   HCT 40.5 09/07/2019   MCV 90.0 09/07/2019   PLT 205 09/07/2019      Component Value Date/Time   NA 139 09/07/2019 0418   NA 143 08/28/2019 1123   K 4.3 09/07/2019 0418   CL 108 09/07/2019 0418   CO2 23 09/07/2019 0418   GLUCOSE 110 (H) 09/07/2019 0418   BUN 19 09/07/2019 0418   BUN 13 08/28/2019 1123   CREATININE 1.44 (H) 09/07/2019 0418   CALCIUM 9.0 09/07/2019 0418   PROT 6.7 09/05/2018 1016   ALBUMIN 4.1 09/05/2018 1016   AST 10 09/05/2018 1016   ALT 10 09/05/2018 1016   ALKPHOS 88 09/05/2018 1016   BILITOT 1.2 09/05/2018 1016   GFRNONAA 52 (L) 09/07/2019 0418   GFRAA 60 (L) 09/07/2019 0418   Lab Results  Component Value Date   CHOL 159 11/06/2019   HDL 47 11/06/2019   LDLCALC 82 11/06/2019    TRIG 176 (H) 11/06/2019   CHOLHDL 3.4 11/06/2019   No results found for: HGBA1C No results found for: VITAMINB12 No results found for: TSH   ASSESSMENT AND PLAN 64 y.o. year old male  has a past medical history of AAA (abdominal aortic aneurysm) (HCC) (09/2018), Allergy, Anxiety, Arthritis, Colitis, Coronary artery disease (12/06), GERD (gastroesophageal reflux disease), HTN (hypertension), Hypercholesteremia, Myocardial infarction [facility]), Sleep apnea, Snoring, Substance abuse (HCC), Umbilical hernia, and Ventral hernia. here with     ICD-10-CM   1. Complex sleep apnea syndrome  G47.31   2. Obstructive sleep apnea treated with continuous positive airway pressure (CPAP)  G47.33    Z99.89      [facility] has struggled with meeting compliance with CPAP.  He has had  some cardiac health concerns that he contributes to compliance.  Compliance report shows 50% daily compliance and 20% 4-hour compliance.  We have discussed concerns of insomnia.  He was advised to try valerian root over-the-counter.  I will adjust pressure settings as AHI is elevated at 14 events per hour.  We will monitor closely as most events are central.  We will consider titration study pending next download in 4 to 6 weeks.  She was encouraged to continue using CPAP nightly and for greater than 4 hours each night. We will update supply orders as indicated. Risks of untreated sleep apnea review and education materials provided. Healthy lifestyle habits encouraged. She will follow up in 3 months, sooner if needed. She verbalizes understanding and agreement with this plan.   No orders of the defined types were placed in this encounter.    No orders of the defined types were placed in this encounter.     I spent 15 minutes with the patient. 50% of this time was spent counseling and educating patient on plan of care and medications.    Shawnie Dapper, FNP-C 12/21/2019, 2:07 PM [facility] [address], [address] [place], [place] [address] [phone]

## 2019-12-21 NOTE — Patient Instructions (Signed)
Please continue using your CPAP regularly. While your insurance requires that you use CPAP at least 4 hours each night on 70% of the nights, I recommend, that you not skip any nights and use it throughout the night if you can. Getting used to CPAP and staying with the treatment long term does take time and patience and discipline. Untreated obstructive sleep apnea when it is moderate to severe can have an adverse impact on cardiovascular health and raise her risk for heart disease, arrhythmias, hypertension, congestive heart failure, stroke and diabetes. Untreated obstructive sleep apnea causes sleep disruption, nonrestorative sleep, and sleep deprivation. This can have an impact on your day to day functioning and cause daytime sleepiness and impairment of cognitive function, memory loss, mood disturbance, and problems focussing. Using CPAP regularly can improve these symptoms.   We will increase pressure settings to 13cmH20. Try Valerian root 450-600mg  every at bedtime. Follow up with PCP, cardiology and pulmonology.   Follow up with me in 3-4 months   Sleep Apnea Sleep apnea affects breathing during sleep. It causes breathing to stop for a short time or to become shallow. It can also increase the risk of:  Heart attack.  Stroke.  Being very overweight (obese).  Diabetes.  Heart failure.  Irregular heartbeat. The goal of treatment is to help you breathe normally again. What are the causes? There are three kinds of sleep apnea:  Obstructive sleep apnea. This is caused by a blocked or collapsed airway.  Central sleep apnea. This happens when the brain does not send the right signals to the muscles that control breathing.  Mixed sleep apnea. This is a combination of obstructive and central sleep apnea. The most common cause of this condition is a collapsed or blocked airway. This can happen if:  Your throat muscles are too relaxed.  Your tongue and tonsils are too large.  You are  overweight.  Your airway is too small. What increases the risk?  Being overweight.  Smoking.  Having a small airway.  Being older.  Being male.  Drinking alcohol.  Taking medicines to calm yourself (sedatives or tranquilizers).  Having family members with the condition. What are the signs or symptoms?  Trouble staying asleep.  Being sleepy or tired during the day.  Getting angry a lot.  Loud snoring.  Headaches in the morning.  Not being able to focus your mind (concentrate).  Forgetting things.  Less interest in sex.  Mood swings.  Personality changes.  Feelings of sadness (depression).  Waking up a lot during the night to pee (urinate).  Dry mouth.  Sore throat. How is this diagnosed?  Your medical history.  A physical exam.  A test that is done when you are sleeping (sleep study). The test is most often done in a sleep lab but may also be done at home. How is this treated?   Sleeping on your side.  Using a medicine to get rid of mucus in your nose (decongestant).  Avoiding the use of alcohol, medicines to help you relax, or certain pain medicines (narcotics).  Losing weight, if needed.  Changing your diet.  Not smoking.  Using a machine to open your airway while you sleep, such as: ? An oral appliance. This is a mouthpiece that shifts your lower jaw forward. ? A CPAP device. This device blows air through a mask when you breathe out (exhale). ? An EPAP device. This has valves that you put in each nostril. ? A BPAP device. This  device blows air through a mask when you breathe in (inhale) and breathe out.  Having surgery if other treatments do not work. It is important to get treatment for sleep apnea. Without treatment, it can lead to:  High blood pressure.  Coronary artery disease.  In men, not being able to have an erection (impotence).  Reduced thinking ability. Follow these instructions at home: Lifestyle  Make changes  that your doctor recommends.  Eat a healthy diet.  Lose weight if needed.  Avoid alcohol, medicines to help you relax, and some pain medicines.  Do not use any products that contain nicotine or tobacco, such as cigarettes, e-cigarettes, and chewing tobacco. If you need help quitting, ask your doctor. General instructions  Take over-the-counter and prescription medicines only as told by your doctor.  If you were given a machine to use while you sleep, use it only as told by your doctor.  If you are having surgery, make sure to tell your doctor you have sleep apnea. You may need to bring your device with you.  Keep all follow-up visits as told by your doctor. This is important. Contact a doctor if:  The machine that you were given to use during sleep bothers you or does not seem to be working.  You do not get better.  You get worse. Get help right away if:  Your chest hurts.  You have trouble breathing in enough air.  You have an uncomfortable feeling in your back, arms, or stomach.  You have trouble talking.  One side of your body feels weak.  A part of your face is hanging down. These symptoms may be an emergency. Do not wait to see if the symptoms will go away. Get medical help right away. Call your local emergency services (911 in the [place] Do not drive yourself to the hospital. Summary  This condition affects breathing during sleep.  The most common cause is a collapsed or blocked airway.  The goal of treatment is to help you breathe normally while you sleep. This information is not intended to replace advice given to you by your health care provider. Make sure you discuss any questions you have with your health care provider. Document Revised: 11/19/2017 Document Reviewed: 09/28/2017 Elsevier Patient Education  2020 [employer].

## 2020-01-06 NOTE — Progress Notes (Deleted)
Cardiology Office Note:    Date:  01/06/2020   ID:  Jason Munoz, DOB [birth year], MRN [id number]  PCP:  Jason Paradise, MD  [facility] Cardiologist:   [place], MD  [facility] Electrophysiologist:  None   Referring MD: Jason Paradise, MD   No chief complaint on file.   History of Present Illness:    Jason Munoz is a 64 y.o. male with a hx of CAD, hyperlipidemia, AAA, obstructive sleep apnea, GERD and tobacco abuse. He underwent BMS to LAD in 2006.   He was seen by Azalee Course PA-C on 08/04/2019. He was complaining of worsening dyspnea on exertion at the time. He quit smoking in April however has since noticing intermittent chest tightness across the entire chest.   Echocardiogram obtained on 08/24/2019 showed EF 60 to 65%, grade 1 DD.  Myoview obtained on 08/17/2019 showed EF 46%, small defect of mild severity present in the basal inferior and mid inferior location, this is consistent with ischemia, overall it was a low risk study. He continued to have symptoms so a  Cardiac catheterization was performed on 09/01/2019 revealed single-vessel disease with 100% proximal to mid RCA occlusion, patent proximal LAD stent, 60% mid LAD disease, 40% OM1 lesion.  Normal right heart pressure.  It was felt that his symptom may be related to RCA CTO, therefore he was brought back to the hospital on 09/06/2019 for CTO PCI.  He eventually received Synergy 2.5 x 32 mm DES to RCA.  Postprocedure, he was placed on aspirin and Plavix with instruction to continue this for 1 year.   Ultrasound obtained in 2020 showed AAA measuring at 3.4 cm.   CT abdomen and pelvis with contrast on 08/03/2019, this revealed a stable 3.4 cm infrarenal abdominal aortic aneurysm. Recommend follow-up ultrasound in 3 years.   Follow up lab work revealed he was not at goal on his cholesterol. Was seen in lipid clinic and started on praluent.   On follow up today he is doing well. Still notes some SOB but not as bad as  before. Is interested in seeing pulmonary given history of tobacco abuse. Is trying to eat better but still eats sausage or baloney biscuits.   Past Medical History:  Diagnosis Date  . AAA (abdominal aortic aneurysm) (HCC) 09/2018   3.4cm  . Allergy   . Anxiety   . Arthritis   . Colitis   . Coronary artery disease 12/06   stent LAD in 2006  . GERD (gastroesophageal reflux disease)   . HTN (hypertension)   . Hypercholesteremia   . Myocardial infarction (HCC)   . Sleep apnea    uses cpap  . Snoring   . Substance abuse (HCC)   . Umbilical hernia   . Ventral hernia     Past Surgical History:  Procedure Laterality Date  . 2006    . APPENDECTOMY    . bilateral ankle fractures    . CARPAL TUNNEL RELEASE    . COLONOSCOPY    . CORONARY ANGIOGRAPHY N/A 09/06/2019   Procedure: CORONARY ANGIOGRAPHY;  Surgeon: [place],  M, MD;  Location: [facility] INVASIVE CV LAB;  Service: Cardiovascular;  Laterality: N/A;  . CORONARY CTO INTERVENTION N/A 09/06/2019   Procedure: CORONARY CTO INTERVENTION;  Surgeon: [place],  M, MD;  Location: [facility] INVASIVE CV LAB;  Service: Cardiovascular;  Laterality: N/A;  RCA  . ELBOW SURGERY Right   . EXTERNAL EAR SURGERY    . INTRAVASCULAR ULTRASOUND/IVUS N/A 09/06/2019   Procedure: Intravascular  Ultrasound/IVUS;  Surgeon: [place],  M, MD;  Location: [facility] INVASIVE CV LAB;  Service: Cardiovascular;  Laterality: N/A;  . NECK SURGERY    . RIGHT/LEFT HEART CATH AND CORONARY ANGIOGRAPHY N/A 09/01/2019   Procedure: RIGHT/LEFT HEART CATH AND CORONARY ANGIOGRAPHY;  Surgeon: [place],  M, MD;  Location: [facility] INVASIVE CV LAB;  Service: Cardiovascular;  Laterality: N/A;  . UPPER GASTROINTESTINAL ENDOSCOPY      Current Medications: No outpatient medications have been marked as taking for the 01/08/20 encounter (Appointment) with [place],  M, MD.     Allergies:   Patient has no known allergies.   Social History   Socioeconomic History  . Marital status: Married     Spouse name: Not on file  . Number of children: 3  . Years of education: HS  . Highest education level: Not on file  Occupational History  . Occupation: retired     Associate Professor: [employer]  Tobacco Use  . Smoking status: Former Smoker    Packs/day: 1.50    Years: 33.00    Pack years: 49.50    Types: Cigarettes    Quit date: 06/04/2019    Years since quitting: 0.5  . Smokeless tobacco: Former Clinical biochemist  . Vaping Use: Former  Substance and Sexual Activity  . Alcohol use: Never  . Drug use: No  . Sexual activity: Yes    Birth control/protection: None  Other Topics Concern  . Not on file  Social History Narrative   Lives at home with his wife.   Right-handed.   No caffeine use.   Social Determinants of Health   Financial Resource Strain:   . Difficulty of Paying Living Expenses: Not on file  Food Insecurity:   . Worried About Programme researcher, broadcasting/film/video in the Last Year: Not on file  . Ran Out of Food in the Last Year: Not on file  Transportation Needs:   . Lack of Transportation (Medical): Not on file  . Lack of Transportation (Non-Medical): Not on file  Physical Activity:   . Days of Exercise per Week: Not on file  . Minutes of Exercise per Session: Not on file  Stress:   . Feeling of Stress : Not on file  Social Connections:   . Frequency of Communication with Friends and Family: Not on file  . Frequency of Social Gatherings with Friends and Family: Not on file  . Attends Religious Services: Not on file  . Active Member of Clubs or Organizations: Not on file  . Attends Banker Meetings: Not on file  . Marital Status: Not on file     Family History: The patient's family history includes COPD in his father. There is no history of Stomach cancer, Rectal cancer, Esophageal cancer, Colon cancer, or Colon polyps.  ROS:   Please see the history of present illness.     All other systems reviewed and are negative.  EKGs/Labs/Other Studies Reviewed:     The following studies were reviewed today:  Cath 09/01/2019  Prox LAD lesion is 35% stenosed.  Mid LAD lesion is 60% stenosed.  Prox RCA to Mid RCA lesion is 100% stenosed.  1st Mrg lesion is 40% stenosed.  LV end diastolic pressure is normal.   1. Single vessel occlusive CAD involving the RCA. The prior stent in the proximal LAD is still patent. There is a borderline stenosis in the mid LAD 2. Normal LV filling pressures. 3. Normal right heart pressures 4. Normal cardiac output.  Plan: based on findings today and recent stress test I feel his symptoms are related to the RCA occlusion. Symptoms have been present for 3-4 months. He is on good medical therapy. Options are to attempt CTO PCI versus increasing medical therapy. Will discuss with patient.    Cath 09/06/2019  Prox LAD lesion is 35% stenosed.  Mid LAD lesion is 60% stenosed.  1st Mrg lesion is 40% stenosed.  Prox RCA to Mid RCA lesion is 100% stenosed.  Post intervention, there is a 0% residual stenosis.  A drug-eluting stent was successfully placed using a SYNERGY XD 2.50X32.   1. Successful CTO PCI of the mid RCA with DES x 1 and IVUS guidance  Plan: observe overnight . Anticipate DC in am. DAPT for one year.      EKG:  EKG is not ordered today.   Recent Labs: 09/07/2019: BUN 19; Creatinine, Ser 1.44; Hemoglobin 13.4; Platelets 205; Potassium 4.3; Sodium 139  Recent Lipid Panel    Component Value Date/Time   CHOL 159 11/06/2019 1132   TRIG 176 (H) 11/06/2019 1132   HDL 47 11/06/2019 1132   CHOLHDL 3.4 11/06/2019 1132   LDLCALC 82 11/06/2019 1132     Dated 01/30/19: cholesterol 190, triglycerides 107, HDL 40, LDL 129.  Dated 08/03/19: A1c 5.8%.LFTs and TSH normal Dated 09/07/19: creatinine 1.44. otherwise BMET and CBC normal.  Physical Exam:    VS:  There were no vitals taken for this visit.    Wt Readings from Last 3 Encounters:  12/21/19 200 lb (90.7 kg)  11/28/19 193 lb 9.6 oz (87.8 kg)   11/14/19 194 lb 6.4 oz (88.2 kg)     GEN:  Well nourished, well developed in no acute distress HEENT: Normal NECK: No JVD; No carotid bruits LYMPHATICS: No lymphadenopathy CARDIAC: RRR, no murmurs, rubs, gallops RESPIRATORY:  Clear to auscultation without rales, wheezing or rhonchi  ABDOMEN: Soft, non-tender, non-distended MUSCULOSKELETAL:  No edema; No deformity  SKIN: Warm and dry NEUROLOGIC:  Alert and oriented x 3 PSYCHIATRIC:  Normal affect   ASSESSMENT:    No diagnosis found. PLAN:    In order of problems listed above:  1. CAD: s/p remote BMS of LAD. S/p CTO PCI of RCA in July 2021.  He has been compliant with aspirin and Plavix. No angina. Dyspnea is improved.   2. Hyperlipidemia: Continue Lipitor 80 mg daily. Will update lipid panel. Goal LDL < 70. If not at goal consider PCSK 9 inhibitor.  3. AAA: Previous CTA of the abdomen obtained on 08/03/2019 showed a stable 3.4 cm infrarenal abdominal aortic aneurysm, recommend follow-up ultrasound in 3 years.  4.   Dyspnea with history of tobacco abuse. Follow up with pulmonary.   Medication Adjustments/Labs and Tests Ordered: Current medicines are reviewed at length with the patient today.  Concerns regarding medicines are outlined above.  No orders of the defined types were placed in this encounter.  No orders of the defined types were placed in this encounter.   There are no Patient Instructions on file for this visit.   Signed,  [place], MD  01/06/2020 8:34 AM    [facility]

## 2020-01-08 ENCOUNTER — Ambulatory Visit: Payer: 59 | Admitting: Cardiology

## 2020-01-17 ENCOUNTER — Ambulatory Visit: Payer: 59 | Admitting: Cardiology

## 2020-01-17 DIAGNOSIS — U071 COVID-19: Secondary | ICD-10-CM

## 2020-01-17 HISTORY — DX: COVID-19: U07.1

## 2020-02-01 ENCOUNTER — Ambulatory Visit: Payer: 59 | Admitting: Pulmonary Disease

## 2020-02-13 ENCOUNTER — Telehealth: Payer: Self-pay | Admitting: Family Medicine

## 2020-02-13 DIAGNOSIS — G4739 Other sleep apnea: Secondary | ICD-10-CM

## 2020-02-13 NOTE — Telephone Encounter (Signed)
Called and spoke with pt. Relayed AL,NP message. He is agreeable to this plan. Will be on the look out for a call from the sleep lab to get scheduled. He will call back if he does not hear about getting scheduled in the next week or so.

## 2020-02-13 NOTE — Telephone Encounter (Signed)
Please let Jason Munoz know that I have assessed his compliance download over the past 30 days following adjustment of set pressure from 11 to 13 cm of water pressure.  Unfortunately, his apneic events have worsened with pressure change.  Over the past 30 days he had an average of 30 apneic events per hour.  Most of these were central apneas.  I would really like for him to come back in for a titration study.  We may need to consider a different mode of therapy to help manage the central apneas.  I will place order for titration study today.  He should hear back from the sleep lab soon regarding how to get this set up.  We will follow-up pending results.

## 2020-03-04 ENCOUNTER — Encounter: Payer: Self-pay | Admitting: Pulmonary Disease

## 2020-03-04 ENCOUNTER — Telehealth: Payer: Self-pay | Admitting: Pulmonary Disease

## 2020-03-04 NOTE — Telephone Encounter (Signed)
03/04/2020    Contacted patient's spouse after receiving message on triage line.  Patient spouse reporting that patient had symptom onset of worsening cough, shortness of breath on 02/14/2020.  Patient's spouse was recently COVID-19 positive on 02/10/2020.  Patient tested positive for COVID-19 on 02/27/2020.  Patient has received the first 2 COVID-19 vaccinations but did not receive the booster.  Patient spouse called [phone] times (03/01/2020 and 03/02/2020).  Patient spouse reporting that EMS assessed the patient and vital signs were stable.  It was recommended by EMS that the patient continue at home management.  They counseled against presenting to the emergency room as they reported that the emergency room wait times were greater than 20 hours.  Temperature today is 97.6 off antipyretics.  They do not have the ability to check oxygen levels at home.  They have ordered a pulse oximeter.  Patient reports adherence to Anoro Ellipta.  Patient spouse reporting the patient is having trouble breathing and gets "breathy".  Patient spouse is requesting a prescription for oxygen.  Patient spouse is very concerned that our office has not communicated with the patient that he has emphysema.   Plan: Explained to patient spouse that we will need to have an in office visit to further review and discuss his plan of care.  Schedule patient for 03/11/2020 office visit at 12 PM with Dr. Francine Graven.  Explained to patient that he does have emphysema and he is on Anoro Ellipta for this.  Unfortunately patient is not a candidate for the monoclonal antibody infusion as symptom onset was greater than 10 days ago.  Can consider chest x-ray at office visit on 03/11/2020.  Counseled patient spouse that if symptoms worsen such as shortness of breath, chest pain her oxygen levels dropping below 90% they will need to seek emergent evaluation at an ER or urgent care  Will route note to Dr. Filbert Berthold.

## 2020-03-11 ENCOUNTER — Ambulatory Visit: Payer: 59 | Admitting: Pulmonary Disease

## 2020-03-11 ENCOUNTER — Other Ambulatory Visit: Payer: Self-pay

## 2020-03-11 ENCOUNTER — Encounter: Payer: Self-pay | Admitting: Pulmonary Disease

## 2020-03-11 VITALS — BP 120/80 | HR 66 | Temp 97.7°F | Ht 65.0 in | Wt 193.4 lb

## 2020-03-11 DIAGNOSIS — G4731 Primary central sleep apnea: Secondary | ICD-10-CM

## 2020-03-11 DIAGNOSIS — J438 Other emphysema: Secondary | ICD-10-CM

## 2020-03-11 DIAGNOSIS — Z8616 Personal history of COVID-19: Secondary | ICD-10-CM | POA: Diagnosis not present

## 2020-03-11 NOTE — Progress Notes (Signed)
Synopsis: Referred in 10/2019 for dyspnea  Subjective:   PATIENT ID: Jason Munoz GENDER: male DOB: 1955/12/29, MRN: [id number]   HPI  Chief Complaint  Patient presents with  . Follow-up    SOB got worse with Covid   Jason Munoz is a 65 year old male, former smoker with obstructive sleep apnea on CPAP, GERD, coronary artery disease and reactive airways disease who returns to pulmonary clinic for shortness of breath.  He recently tested positive for covid on 02/27/2020. He has had two vaccines but no booster shot. He has had worsening shortness of breath since contracting covid. He denies cough, mucous production, sinus congestion or drainage, or wheezing. He was started on anoro ellipta after last visit with improvement in his respiratory sypmtoms which included intermittent wheezing and cough.   He is followed at [facility] for his sleep apnea. He recently had his CPAP pressure adjusted from 11 to 13cmH2O which led to an increase in his apneic events up to 30 events per hour which were central apneas. He is being scheduled for an in lab titration study.   He has lost 12lbs since covid.  He quit smoking in 2021 and has a 30+ pack year smoking history. He is undergoing lung cancer screening through our office. CT chest 10/21 did not indicate concerning nodules but did shoe some paraseptal emphysema.  Past Medical History:  Diagnosis Date  . AAA (abdominal aortic aneurysm) (HCC) 09/2018   3.4cm  . Allergy   . Anxiety   . Arthritis   . Colitis   . Coronary artery disease 12/06   stent LAD in 2006  . GERD (gastroesophageal reflux disease)   . HTN (hypertension)   . Hypercholesteremia   . Myocardial infarction (HCC)   . Sleep apnea    uses cpap  . Snoring   . Substance abuse (HCC)   . Umbilical hernia   . Ventral hernia      Family History  Problem Relation Age of Onset  . COPD Father   . Stomach cancer Neg Hx   . Rectal cancer Neg Hx   .  Esophageal cancer Neg Hx   . Colon cancer Neg Hx   . Colon polyps Neg Hx      Social History   Socioeconomic History  . Marital status: Married    Spouse name: Not on file  . Number of children: 3  . Years of education: HS  . Highest education level: Not on file  Occupational History  . Occupation: retired     Associate Professor: [employer]  Tobacco Use  . Smoking status: Former Smoker    Packs/day: 1.50    Years: 33.00    Pack years: 49.50    Types: Cigarettes    Quit date: 06/04/2019    Years since quitting: 0.7  . Smokeless tobacco: Former Clinical biochemist  . Vaping Use: Former  Substance and Sexual Activity  . Alcohol use: Never  . Drug use: No  . Sexual activity: Yes    Birth control/protection: None  Other Topics Concern  . Not on file  Social History Narrative   Lives at home with his wife.   Right-handed.   No caffeine use.   Social Determinants of Health   Financial Resource Strain: Not on file  Food Insecurity: Not on file  Transportation Needs: Not on file  Physical Activity: Not on file  Stress: Not on file  Social Connections: Not on file  Intimate  Partner Violence: Not on file     No Known Allergies   Outpatient Medications Prior to Visit  Medication Sig Dispense Refill  . Alirocumab (PRALUENT) 150 MG/ML SOAJ Inject 150 mg into the skin every 14 (fourteen) days. 2 mL 11  . amLODipine (NORVASC) 2.5 MG tablet Take 2.5 mg by mouth daily.    Marland Kitchen aspirin 81 MG chewable tablet Chew 81 mg by mouth daily.     Marland Kitchen atorvastatin (LIPITOR) 80 MG tablet Take 1 tablet (80 mg total) by mouth daily. 90 tablet 3  . celecoxib (CELEBREX) 200 MG capsule Take 200 mg by mouth daily.    . clopidogrel (PLAVIX) 75 MG tablet Take 1 tablet (75 mg total) by mouth daily. 90 tablet 3  . escitalopram (LEXAPRO) 10 MG tablet Take 10 mg by mouth daily.     . fluticasone (FLONASE) 50 MCG/ACT nasal spray Place 1-2 sprays into both nostrils daily as needed (allergies.).     Marland Kitchen  metoprolol succinate (TOPROL-XL) 25 MG 24 hr tablet Take 25 mg by mouth every evening.    . Multiple Vitamin (MULTIVITAMIN WITH MINERALS) TABS tablet Take 1 tablet by mouth daily with lunch.    . nitroGLYCERIN (NITROSTAT) 0.4 MG SL tablet Place 1 tablet (0.4 mg total) under the tongue every 5 (five) minutes as needed for chest pain (Up to 3 doses. If chest pain persistent after the 3rd dose, call 911). 25 tablet 3  . pantoprazole (PROTONIX) 40 MG tablet Take 40 mg by mouth 2 (two) times daily.    . tamsulosin (FLOMAX) 0.4 MG CAPS capsule Take 0.4 mg by mouth every evening.     . umeclidinium-vilanterol (ANORO ELLIPTA) 62.5-25 MCG/INH AEPB Inhale 1 puff into the lungs daily. 60 each 6  . valACYclovir (VALTREX) 500 MG tablet Take 500 mg by mouth daily.      No facility-administered medications prior to visit.    Review of Systems  Constitutional: Negative for chills, diaphoresis, fever, malaise/fatigue and weight loss.  HENT: Negative for congestion, sinus pain and sore throat.   Eyes: Negative.   Respiratory: Positive for shortness of breath. Negative for cough, hemoptysis, sputum production and wheezing.   Cardiovascular: Negative for chest pain, palpitations, orthopnea, claudication, leg swelling and PND.  Gastrointestinal: Negative for abdominal pain, heartburn, nausea and vomiting.  Genitourinary: Negative.   Musculoskeletal: Negative.   Skin: Negative for rash.  Neurological: Negative.   Endo/Heme/Allergies: Negative.   Psychiatric/Behavioral: Negative.    Objective:   Vitals:   03/11/20 1201  BP: 120/80  Pulse: 66  Temp: 97.7 F (36.5 C)  TempSrc: Oral  SpO2: 100%  Weight: 193 lb 6.4 oz (87.7 kg)  Height: 5\' 5"  (1.651 m)     Physical Exam Constitutional:      General: He is not in acute distress.    Appearance: He is obese. He is not ill-appearing.  HENT:     Head: Normocephalic and atraumatic.  Eyes:     General: No scleral icterus.    Conjunctiva/sclera:  Conjunctivae normal.     Pupils: Pupils are equal, round, and reactive to light.  Cardiovascular:     Rate and Rhythm: Normal rate and regular rhythm.     Pulses: Normal pulses.     Heart sounds: Normal heart sounds. No murmur heard.   Pulmonary:     Effort: Pulmonary effort is normal.     Breath sounds: Normal breath sounds.  Abdominal:     General: Bowel sounds are normal.  Palpations: Abdomen is soft.  Musculoskeletal:     Right lower leg: No edema.     Left lower leg: No edema.  Skin:    General: Skin is warm and dry.     Capillary Refill: Capillary refill takes less than 2 seconds.  Neurological:     General: No focal deficit present.     Mental Status: He is alert.  Psychiatric:        Mood and Affect: Mood normal.        Behavior: Behavior normal.        Thought Content: Thought content normal.        Judgment: Judgment normal.     CBC    Component Value Date/Time   WBC 7.6 09/07/2019 0418   RBC 4.50 09/07/2019 0418   HGB 13.4 09/07/2019 0418   HGB 16.8 08/28/2019 1123   HCT 40.5 09/07/2019 0418   HCT 50.9 08/28/2019 1123   PLT 205 09/07/2019 0418   PLT 213 08/28/2019 1123   MCV 90.0 09/07/2019 0418   MCV 89 08/28/2019 1123   MCH 29.8 09/07/2019 0418   MCHC 33.1 09/07/2019 0418   RDW 15.7 (H) 09/07/2019 0418   RDW 15.9 (H) 08/28/2019 1123   LYMPHSABS 1.1 09/05/2018 1016   MONOABS 0.6 09/05/2018 1016   EOSABS 0.1 09/05/2018 1016   BASOSABS 0.1 09/05/2018 1016   BMP Latest Ref Rng & Units 09/07/2019 09/06/2019 09/01/2019  Glucose 70 - 99 mg/dL 161(W110(H) 960(A108(H) -  BUN 8 - 23 mg/dL 19 20 -  Creatinine 5.400.61 - 1.24 mg/dL 9.81(X1.44(H) 9.14(N1.50(H) -  BUN/Creat Ratio 10 - 24 - - -  Sodium 135 - 145 mmol/L 139 140 143  Potassium 3.5 - 5.1 mmol/L 4.3 4.8 4.2  Chloride 98 - 111 mmol/L 108 103 -  CO2 22 - 32 mmol/L 23 26 -  Calcium 8.9 - 10.3 mg/dL 9.0 82.910.0 -   Chest imaging: Low Dose CT Chest 12/06/19 Mediastinum/Nodes: No enlarged mediastinal, hilar, or axillary  lymph nodes. Thyroid gland, trachea, and esophagus demonstrate no significant findings.  Lungs/Pleura: No pleural effusion. Paraseptal and centrilobular emphysema. No airspace consolidation, atelectasis or pneumothorax. Nodule within the anterior basal right upper lobe has an equivalent diameter of 4.9 mm. Calcified granuloma is noted within the medial aspect of the left lung apex.  Lung-RADS 2, benign appearance or behavior.  PFT: PFT Results Latest Ref Rng & Units 12/01/2019  FVC-Pre L 4.07  FVC-Predicted Pre % 102  FVC-Post L 4.05  FVC-Predicted Post % 102  Pre FEV1/FVC % % 77  Post FEV1/FCV % % 80  FEV1-Pre L 3.14  FEV1-Predicted Pre % 105  FEV1-Post L 3.22  DLCO uncorrected ml/min/mmHg 18.65  DLCO UNC% % 78  DLCO corrected ml/min/mmHg 18.65  DLCO COR %Predicted % 78  DLVA Predicted % 67  TLC L 6.65  TLC % Predicted % 109  RV % Predicted % 121   Echo: 08/24/19 EF 60-65%, normal LV function with grade I diastolic dysfunction. RV systolic function and size are normal. No valvular dysfunction  Heart Catheterization: 09/06/19  Prox LAD lesion is 35% stenosed.  Mid LAD lesion is 60% stenosed.  1st Mrg lesion is 40% stenosed.  Prox RCA to Mid RCA lesion is 100% stenosed.  Post intervention, there is a 0% residual stenosis.  A drug-eluting stent was successfully placed using a SYNERGY XD 2.50X32.  1. Successful CTO PCI of the mid RCA with DES x 1 and IVUS guidance Assessment & Plan:  Paraseptal emphysema (HCC)  Complex sleep apnea syndrome  History of COVID-19  Discussion: Jason Munoz is a 65 year old male, former smoker with obstructive sleep apnea on CPAP, GERD, coronary artery disease and reactive airways disease who returns to pulmonary clinic for shortness of breath.  He has some paraseptal emphysema noted on his recent low dose CT chest for lung cancer screening. He has been doing well on [employer] which he is to continue.  The main concern at  this time is his complex sleep apnea as he had worsening of his AHI with adjustments to his CPAP pressure. I encouraged him to follow up with [facility] as soon as possible for a titration study as he may require bipap therapy or an AVAPs machine.   He is to follow up in 2 months.  Melody Comas, MD [facility] Office: [phone]    Current Outpatient Medications:  .  Alirocumab (PRALUENT) 150 MG/ML SOAJ, Inject 150 mg into the skin every 14 (fourteen) days., Disp: 2 mL, Rfl: 11 .  amLODipine (NORVASC) 2.5 MG tablet, Take 2.5 mg by mouth daily., Disp: , Rfl:  .  aspirin 81 MG chewable tablet, Chew 81 mg by mouth daily. , Disp: , Rfl:  .  atorvastatin (LIPITOR) 80 MG tablet, Take 1 tablet (80 mg total) by mouth daily., Disp: 90 tablet, Rfl: 3 .  celecoxib (CELEBREX) 200 MG capsule, Take 200 mg by mouth daily., Disp: , Rfl:  .  clopidogrel (PLAVIX) 75 MG tablet, Take 1 tablet (75 mg total) by mouth daily., Disp: 90 tablet, Rfl: 3 .  escitalopram (LEXAPRO) 10 MG tablet, Take 10 mg by mouth daily. , Disp: , Rfl:  .  fluticasone (FLONASE) 50 MCG/ACT nasal spray, Place 1-2 sprays into both nostrils daily as needed (allergies.). , Disp: , Rfl:  .  metoprolol succinate (TOPROL-XL) 25 MG 24 hr tablet, Take 25 mg by mouth every evening., Disp: , Rfl:  .  Multiple Vitamin (MULTIVITAMIN WITH MINERALS) TABS tablet, Take 1 tablet by mouth daily with lunch., Disp: , Rfl:  .  nitroGLYCERIN (NITROSTAT) 0.4 MG SL tablet, Place 1 tablet (0.4 mg total) under the tongue every 5 (five) minutes as needed for chest pain (Up to 3 doses. If chest pain persistent after the 3rd dose, call 911)., Disp: 25 tablet, Rfl: 3 .  pantoprazole (PROTONIX) 40 MG tablet, Take 40 mg by mouth 2 (two) times daily., Disp: , Rfl:  .  tamsulosin (FLOMAX) 0.4 MG CAPS capsule, Take 0.4 mg by mouth every evening. , Disp: , Rfl:  .  umeclidinium-vilanterol (ANORO ELLIPTA) 62.5-25 MCG/INH AEPB,  Inhale 1 puff into the lungs daily., Disp: 60 each, Rfl: 6 .  valACYclovir (VALTREX) 500 MG tablet, Take 500 mg by mouth daily. , Disp: , Rfl:

## 2020-03-11 NOTE — Patient Instructions (Addendum)
Continue on anoro ellipta daily  Please call [facility] to get scheduled for in lab sleep study as soon as possible  If your symptoms persist after getting you setup on the correct sleep apnea treatment we will discuss further testing at that time

## 2020-03-18 ENCOUNTER — Encounter: Payer: Self-pay | Admitting: Pulmonary Disease

## 2020-03-21 ENCOUNTER — Other Ambulatory Visit: Payer: Self-pay

## 2020-03-21 ENCOUNTER — Ambulatory Visit (INDEPENDENT_AMBULATORY_CARE_PROVIDER_SITE_OTHER): Payer: 59 | Admitting: Neurology

## 2020-03-21 DIAGNOSIS — G4733 Obstructive sleep apnea (adult) (pediatric): Secondary | ICD-10-CM | POA: Diagnosis not present

## 2020-03-21 DIAGNOSIS — G4739 Other sleep apnea: Secondary | ICD-10-CM

## 2020-03-26 ENCOUNTER — Other Ambulatory Visit: Payer: Self-pay

## 2020-03-26 ENCOUNTER — Encounter: Payer: Self-pay | Admitting: Family Medicine

## 2020-03-26 ENCOUNTER — Ambulatory Visit: Payer: 59 | Admitting: Family Medicine

## 2020-03-26 VITALS — BP 127/92 | HR 72 | Ht 65.0 in | Wt 195.4 lb

## 2020-03-26 DIAGNOSIS — G4733 Obstructive sleep apnea (adult) (pediatric): Secondary | ICD-10-CM | POA: Diagnosis not present

## 2020-03-26 DIAGNOSIS — G4739 Other sleep apnea: Secondary | ICD-10-CM

## 2020-03-26 DIAGNOSIS — G4731 Primary central sleep apnea: Secondary | ICD-10-CM

## 2020-03-26 DIAGNOSIS — Z9989 Dependence on other enabling machines and devices: Secondary | ICD-10-CM | POA: Diagnosis not present

## 2020-03-26 DIAGNOSIS — G4700 Insomnia, unspecified: Secondary | ICD-10-CM

## 2020-03-26 MED ORDER — BUSPIRONE HCL 10 MG PO TABS: 10.0000 mg | ORAL_TABLET | Freq: Every day | ORAL | 3 refills | Status: AC

## 2020-03-26 NOTE — Progress Notes (Signed)
PATIENT: Jason Munoz DOB: [birth year]  REASON FOR VISIT: follow up HISTORY FROM: patient  Chief Complaint  Patient presents with  . Follow-up    CPAP- stated had sleep study last week     HISTORY OF PRESENT ILLNESS:  03/26/20 ALL:  He returns for complex sleep apnea on CPAP compliance review. CPAP pressure changed from 11cmH20 to 13cmH20 at last visit with me in 12/2019. Review of download 02/13/2020 showed AHI remained elevated despite change and titration study ordered. He had titration study 03/21/2020 with Dr Vickey Huger. Study is currently in process of being read. Since last visit, he has continued to struggle with shortness of breath. Cardiology and pulmonology continue close follow up but feel symptoms are related to unmanaged sleep apnea. He also suffered from positive Covid 19 infection at the beginning of 2022. He is feeling better now. He continues to struggle with daily and four hour compliance. He has been better about using CPAP daily but feels he has a hard time keeping it on all night. When he naps during the day (usually once a day) he can use CPAP without any problems. When he uses CPAP at night, he feels that he wrestles with the tubing and becomes irritated. He is using melatonin nightly to see if it will help with anxiety but has not noted any benefit. He sometimes takes 10-30mg  a night.   Compliance report dated 12/20/2019 through 03/18/2020 reveals that he used CPAP 79 of the past 90 days for compliance of 88%.  He used CPAP greater than 4 hours 26 of the past 90 days for compliance of 29%.  Average usage on days used was 3 hours and 4 minutes.  Residual AHI remains elevated at 26.1 events per hour with 22 central events and 3.5 obstructive.  He is on a set pressure of 13 cm of water, increased from 11 cm at last visit in November.  There is no significant leak.   12/21/2019 ALL:  Jason Munoz is a 65 y.o. male here today for follow up for OSA on CPAP. He admits that he  has not been very compliant with CPAP therapy. He has had two cardiac cath procedures with stents placed since last being seen. He reports that he continues to have shob. He is followed by pulmonology.  He admits that he has not used CPAP consistently.  He has had some health concerns that have prevented use.  He continues to snore.  He has a hard time going to sleep.   Compliance report dated 11/20/2019 through 12/19/2019 reveals that he used CPAP 15 of the past 30 days for compliance of 50%.  He used CPAP for greater than 4 hours 6 of the past 30 days for compliance of 20%.  Average usage on days used was 3 hours and 31 minutes.  Residual AHI was 14.4 on a set pressure of 11 cm of water and EPR of 3.  There was no significant leak noted.  Of note, he had 10.6 central events, 3.1 obstructive events.    HISTORY: (copied from Dr Dohmeier's note on 02/13/2019)  02-13-2019-  Jason Munoz is a 65 y.o. male , was seen for multiple Rv by Sherril Croon. He had undergone a sleep study on 11-20-2016 and diagnosed with mostly obstructive , complex sleep apnea at [facility] of only 7.9, RDI 9.3/h -  REM AHI 16.5/h. 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  I reviewed the patient's download and also  he has used the machine on every also last 30 days he has not always been able to use it for longer than 2 hours, the average user time is 2 hours and 54 minutes on the, he uses CPAP with a set pressure of 11 cmH2O and 3 cm EPR, his residual AHI is 19.3 almost double the apnea count he had a baseline, and these are central dominant apneas.  My suspicion is that the patient has treatment emergent central apnea and needs to be titrated to a BiPAP, air leakage seems not to be the problem. He is using a nasal pillow, which he likes.  In summer he broke 3 ribs , had to sleep in a recliner, for 3 weeks in september he went out of town without CPAP, has abdominal pain, and was less and less compliant.  He went with his wife  to [place] before christmas.    2018- seen here as in a referral/ revisit  from Dr. Jacky Kindle for sleep consultation. Jason Munoz reports tinnitus, fatigue, snoring, erectile dysfunction and a history of high cholesterol. He has also been treated for GERD. Hypertension has been treated with Toprol and Cozaar. He takes a baby aspirin daily. He smokes one pack per day drinks socially alcohol but does not use nonprescription drugs or caffeine. He has been working for many years at [employer] and is a Education officer, museum. His 12 hour shift will begin at 4 AM and an's at 4:30 PM, other days he will start at 8 AM to 8 PM. He still feels that he gets nighttime sleep and rest.  Sleep habits are as follows:  Usually the patient will come home after work to eat supper with his spouse, he may do some gardening or lawnmowing. And evening as usually spend watching TV. The patient had neck surgery, his wife hip surgery and there not participating in a regular exercise regimen at this time. Bedtime is usually around 10:30 during the week. The patient reports falling asleep rather promptly, he sleeps on his side, on one pillow. The bedroom is quiet, cool and dark with a ceiling fan. He has up to 3 bathroom breaks each night to go to the bathroom and he wakes up frequently for other reasons 2. He has hip pain, he wakes up and goes back to sleep but he wakes up frequently enough to sleep is fragmented. He rises at the morning at about 3 AM to go to his early shift. He feels that he gets his best sleep just before he has to rise at 3 AM. For this reason he will shift to the 8 to 8 shift only. Currently he rises at 6:50 AM and off this way gets about 5-6 hours of sleep for sure. Most mornings he will be refreshed and restored when waking up. He does not wake with headaches, nausea dizziness chest pain or shortness of breath.  Sleep medical history and family sleep history:  One sister, father wears CPAP. No history  of sleep walking or night terrors.   Social history: newlywed, lives with spouse, patient has grown children from a previous marriage.    REVIEW OF SYSTEMS: Out of a complete 14 system review of symptoms, the patient complains only of the following symptoms, shortness of breath, insomnia and all other reviewed systems are negative.  ESS: 13 in 12/2019, now 11   ALLERGIES: No Known Allergies  HOME MEDICATIONS: Outpatient Medications Prior to Visit  Medication Sig Dispense Refill  . Alirocumab (  PRALUENT) 150 MG/ML SOAJ Inject 150 mg into the skin every 14 (fourteen) days. 2 mL 11  . aspirin 81 MG chewable tablet Chew 81 mg by mouth daily.     Marland Kitchen atorvastatin (LIPITOR) 80 MG tablet Take 1 tablet (80 mg total) by mouth daily. 90 tablet 3  . celecoxib (CELEBREX) 200 MG capsule Take 200 mg by mouth daily.    . clopidogrel (PLAVIX) 75 MG tablet Take 1 tablet (75 mg total) by mouth daily. 90 tablet 3  . escitalopram (LEXAPRO) 10 MG tablet Take 10 mg by mouth daily.     . fluticasone (FLONASE) 50 MCG/ACT nasal spray Place 1-2 sprays into both nostrils daily as needed (allergies.).     Marland Kitchen metoprolol succinate (TOPROL-XL) 25 MG 24 hr tablet Take 25 mg by mouth every evening.    . Multiple Vitamin (MULTIVITAMIN WITH MINERALS) TABS tablet Take 1 tablet by mouth daily with lunch.    . pantoprazole (PROTONIX) 40 MG tablet Take 40 mg by mouth 2 (two) times daily.    . tamsulosin (FLOMAX) 0.4 MG CAPS capsule Take 0.4 mg by mouth every evening.     . umeclidinium-vilanterol (ANORO ELLIPTA) 62.5-25 MCG/INH AEPB Inhale 1 puff into the lungs daily. 60 each 6  . valACYclovir (VALTREX) 500 MG tablet Take 500 mg by mouth daily.     . nitroGLYCERIN (NITROSTAT) 0.4 MG SL tablet Place 1 tablet (0.4 mg total) under the tongue every 5 (five) minutes as needed for chest pain (Up to 3 doses. If chest pain persistent after the 3rd dose, call 911). (Patient not taking: Reported on 03/26/2020) 25 tablet 3  . amLODipine  (NORVASC) 2.5 MG tablet Take 2.5 mg by mouth daily.     No facility-administered medications prior to visit.    PAST MEDICAL HISTORY: Past Medical History:  Diagnosis Date  . AAA (abdominal aortic aneurysm) (HCC) 09/2018   3.4cm  . Allergy   . Anxiety   . Arthritis   . Colitis   . Coronary artery disease 12/06   stent LAD in 2006  . COVID-19 01/2020  . GERD (gastroesophageal reflux disease)   . HTN (hypertension)   . Hypercholesteremia   . Myocardial infarction (HCC)   . Sleep apnea    uses cpap  . Snoring   . Substance abuse (HCC)   . Umbilical hernia   . Ventral hernia     PAST SURGICAL HISTORY: Past Surgical History:  Procedure Laterality Date  . 2006    . APPENDECTOMY    . bilateral ankle fractures    . CARPAL TUNNEL RELEASE    . COLONOSCOPY    . CORONARY ANGIOGRAPHY N/A 09/06/2019   Procedure: CORONARY ANGIOGRAPHY;  Surgeon: Swaziland, Peter M, MD;  Location: [facility] INVASIVE CV LAB;  Service: Cardiovascular;  Laterality: N/A;  . CORONARY CTO INTERVENTION N/A 09/06/2019   Procedure: CORONARY CTO INTERVENTION;  Surgeon: Swaziland, Peter M, MD;  Location: [facility] INVASIVE CV LAB;  Service: Cardiovascular;  Laterality: N/A;  RCA  . ELBOW SURGERY Right   . EXTERNAL EAR SURGERY    . INTRAVASCULAR ULTRASOUND/IVUS N/A 09/06/2019   Procedure: Intravascular Ultrasound/IVUS;  Surgeon: Swaziland, Peter M, MD;  Location: [facility] INVASIVE CV LAB;  Service: Cardiovascular;  Laterality: N/A;  . NECK SURGERY    . RIGHT/LEFT HEART CATH AND CORONARY ANGIOGRAPHY N/A 09/01/2019   Procedure: RIGHT/LEFT HEART CATH AND CORONARY ANGIOGRAPHY;  Surgeon: Swaziland, Peter M, MD;  Location: [facility] INVASIVE CV LAB;  Service: Cardiovascular;  Laterality: N/A;  .  UPPER GASTROINTESTINAL ENDOSCOPY      FAMILY HISTORY: Family History  Problem Relation Age of Onset  . COPD Father   . Stomach cancer Neg Hx   . Rectal cancer Neg Hx   . Esophageal cancer Neg Hx   . Colon cancer Neg Hx   . Colon polyps Neg Hx     SOCIAL  HISTORY: Social History   Socioeconomic History  . Marital status: Married    Spouse name: Not on file  . Number of children: 3  . Years of education: HS  . Highest education level: Not on file  Occupational History  . Occupation: retired     Associate Professormployer: [employer]  Tobacco Use  . Smoking status: Former Smoker    Packs/day: 1.50    Years: 33.00    Pack years: 49.50    Types: Cigarettes    Quit date: 06/04/2019    Years since quitting: 0.8  . Smokeless tobacco: Former Clinical biochemistUser  Vaping Use  . Vaping Use: Former  Substance and Sexual Activity  . Alcohol use: Never  . Drug use: No  . Sexual activity: Yes    Birth control/protection: None  Other Topics Concern  . Not on file  Social History Narrative   Lives at home with his wife.   Right-handed.   No caffeine use.   Social Determinants of Health   Financial Resource Strain: Not on file  Food Insecurity: Not on file  Transportation Needs: Not on file  Physical Activity: Not on file  Stress: Not on file  Social Connections: Not on file  Intimate Partner Violence: Not on file     PHYSICAL EXAM  Vitals:   03/26/20 1057  BP: (!) 127/92  Pulse: 72  SpO2: 98%  Weight: 195 lb 6.4 oz (88.6 kg)  Height: 5\' 5"  (1.651 m)   Body mass index is 32.52 kg/m.  Generalized: Well developed, in no acute distress  Cardiology: normal rate and rhythm, no murmur noted Respiratory: clear to auscultation bilaterally  Neurological examination  Mentation: Alert oriented to time, place, history taking. Follows all commands speech and language fluent Cranial nerve II-XII: Pupils were equal round reactive to light. Extraocular movements were full, visual field were full  Motor: The motor testing reveals 5 over 5 strength of all 4 extremities. Good symmetric motor tone is noted throughout.  Gait and station: Gait is normal.    DIAGNOSTIC DATA (LABS, IMAGING, TESTING) - I reviewed patient records, labs, notes, testing and imaging  myself where available.  No flowsheet data found.   Lab Results  Component Value Date   WBC 7.6 09/07/2019   HGB 13.4 09/07/2019   HCT 40.5 09/07/2019   MCV 90.0 09/07/2019   PLT 205 09/07/2019      Component Value Date/Time   NA 139 09/07/2019 0418   NA 143 08/28/2019 1123   K 4.3 09/07/2019 0418   CL 108 09/07/2019 0418   CO2 23 09/07/2019 0418   GLUCOSE 110 (H) 09/07/2019 0418   BUN 19 09/07/2019 0418   BUN 13 08/28/2019 1123   CREATININE 1.44 (H) 09/07/2019 0418   CALCIUM 9.0 09/07/2019 0418   PROT 6.7 09/05/2018 1016   ALBUMIN 4.1 09/05/2018 1016   AST 10 09/05/2018 1016   ALT 10 09/05/2018 1016   ALKPHOS 88 09/05/2018 1016   BILITOT 1.2 09/05/2018 1016   GFRNONAA 52 (L) 09/07/2019 0418   GFRAA 60 (L) 09/07/2019 0418   Lab Results  Component Value Date  CHOL 159 11/06/2019   HDL 47 11/06/2019   LDLCALC 82 11/06/2019   TRIG 176 (H) 11/06/2019   CHOLHDL 3.4 11/06/2019   No results found for: HGBA1C No results found for: VITAMINB12 No results found for: TSH   ASSESSMENT AND PLAN 66 y.o. year old male  has a past medical history of AAA (abdominal aortic aneurysm) (HCC) (09/2018), Allergy, Anxiety, Arthritis, Colitis, Coronary artery disease (12/06), COVID-19 (01/2020), GERD (gastroesophageal reflux disease), HTN (hypertension), Hypercholesteremia, Myocardial infarction [facility]), Sleep apnea, Snoring, Substance abuse (HCC), Umbilical hernia, and Ventral hernia. here with     ICD-10-CM   1. Complex sleep apnea syndrome  G47.31   2. Obstructive sleep apnea treated with continuous positive airway pressure (CPAP)  G47.33    Z99.89   3. Insomnia, unspecified type  G47.00      Jason Munoz has continued to struggle with CPAP therapy at home.  He feels that he gets anxious and irritable with CPAP at night.  He is able to tolerate fine with daytime naps.  Melatonin has not been helpful.  Review of compliance report shows acceptable daily compliance and suboptimal  4-hour compliance.  Shortness of breath continues to be a major concern.  I have suggested that we try BuSpar 10 mg about 30 minutes before bedtime.  He did have a titration study with Dr. Vickey Huger on 03/21/2020.  We are awaiting results from that study.  I anticipate pressure changes will be made once reviewed.  I have notified Dr. Vickey Huger of his visit.  Once pressure settings are changed, we will repeat download in 4 to 6 weeks.  We will plan follow-up pending results.  He was encouraged to continue close follow-up with cardiology, pulmonology and primary care.  I have encouraged him to resume CPAP therapy daily and focus on meeting 4-hour compliance every day.  Healthy lifestyle habits encouraged.  He verbalizes understanding and agreement with this plan.   No orders of the defined types were placed in this encounter.    Meds ordered this encounter  Medications  . busPIRone (BUSPAR) 10 MG tablet    Sig: Take 1 tablet (10 mg total) by mouth at bedtime.    Dispense:  90 tablet    Refill:  3    Order Specific Question:   Supervising Provider    Answer:   Anson Fret [id number]      I spent 25 minutes with the patient. 50% of this time was spent counseling and educating patient on plan of care and medications.    Shawnie Dapper, FNP-C 03/26/2020, 11:34 AM [facility] [address] [place], [place] [address] [phone]

## 2020-03-26 NOTE — Procedures (Signed)
PATIENT'S NAME:  Jason Munoz DOB:      [birth year]      MR#:    [id number]     DATE OF RECORDING: 03/21/2020 M. Baird Kay M.D.:  Shawnie Dapper, NP and Geoffry Paradise, MD Study Performed:   Titration to positive airway pressure  HISTORY:  12/21/19 Jason Munoz is a 65year-old patient who was seen for multiple Rv by Sherril Croon. He had undergone a sleep study on 11-20-2016 and was diagnosed with mostly obstructive - complex sleep apnea at AHI of only 7.9/h, RDI 9.3/h -  REM AHI 16.5/h. 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   I reviewed the patient's download ; he has used the machine on every of the last 30 days he has not always been able to use it for longer than 2 hours, the average user time is 2 hours and 54 minutes on the, he uses CPAP with a set pressure of 13 cmH2O and 3 cm EPR, his residual AHI is 19.3/h (!!) - almost double the apnea count he had a baseline, and these are central dominant apneas.   My suspicion is that the patient has treatment emergent central apnea and needs to be titrated to a BiPAP, air leakage seems not to be the problem. He is using a nasal pillow, which he likes.  The patient endorsed the Epworth Sleepiness Scale at 13 points and the Fatigue Score at 28 points.   The patient's weight 200 pounds with a height of 66 (inches), resulting in a BMI of 32.2 kg/m2.  CURRENT MEDICATIONS: Aspirin 81, Lipitor, Nexium, Toprol-XL, Multivitamin PO, Nitroglycerin, Flonase, Plavix, Protonix, Flomax, Anoror Ellipta, Valtrex, Ventolin HFA, Nitrostat    PROCEDURE:  This is a multichannel digital polysomnogram utilizing the SomnoStar 11.2 system.  Electrodes and sensors were applied and monitored per AASM Specifications.   EEG, EOG, Chin and Limb EMG, were sampled at 200 Hz.  ECG, Snore and Nasal Pressure, Thermal Airflow, Respiratory Effort, CPAP Flow and Pressure, Oximetry was sampled at 50 Hz. Digital video and audio were recorded.       CPAP was initiated  under a DreamWear nasal cushion mask in medium size, which is the patient's own. Started at 5 cmH20 with heated humidity per AASM standards the pressure was advanced to a final 13 cmH20.  At a PAP pressure of 13 cmH20, there was a reduction of the AHI to 0.0/h with improvement of sleep apnea. There were only 13 minutes of sleep seen under 13 cm water. There was poor sleep efficiency noted and most of sleep was REM sleep -which is not prone to central apnea.   Lights Out was at 21:38 and Lights On at 04:08. Total recording time (TRT) was 391 minutes, with a total sleep time (TST) of 226.5 minutes. The patient's sleep latency was 98.5 minutes. REM latency was 119 minutes.  The sleep efficiency was 57.9 %.    SLEEP ARCHITECTURE: WASO (Wake after sleep onset) was 71 minutes.  There were 18 minutes in Stage N1, 160.5 minutes Stage N2, 4 minutes Stage N3 and 44 minutes in Stage REM.  The percentage of Stage N1 was 7.9%, Stage N2 was 70.9%, Stage N3 was 1.8% and Stage R (REM sleep) was 19.4%.      RESPIRATORY ANALYSIS:  There was a total of 26 respiratory events: 3 obstructive apneas, 1 central apnea and 10 mixed apneas with a total of 14 apneas and an apnea index (AI) of 3.7 /hour.  There were 12 hypopneas with a hypopnea index of 3.2/hour.   The total APNEA/HYPOPNEA INDEX (AHI) was 6.9 /hour .  4 events occurred in REM sleep and 22 events in NREM. The REM AHI was 5.5 /hour versus a non-REM AHI of 7.2 /hour.  The patient spent 126 minutes of total sleep time in the supine position and 101 minutes in non-supine.  The supine AHI was 7.2, versus a non-supine AHI of 6.6.  OXYGEN SATURATION & C02:  The baseline 02 saturation was 96%, with the lowest being 87%. Time spent below 89% saturation equaled 1 minutes.  The arousals were noted as: 24 were spontaneous, 0 were associated with PLMs, 2 were associated with respiratory events. The patient had a total of 0 Periodic Limb Movements.   Audio and video  analysis did not show any abnormal or unusual movements, behaviors, phonations or vocalizations.   EKG was in keeping with regular sinus rhythm (NSR).  DIAGNOSIS 1. Obstructive Sleep Apnea responded here to 13 cm water pressure without EPR- under the ale ready used DreamWear mask.  2. No treatment emergent Central Sleep Apnea was noted here.      PLANS/RECOMMENDATIONS: 1. Follow-up with referring physician. We will continue to use CPAP but reduce the EPR setting to 1 cm h20, allowing for a pressure range from 7 cm to 15 cm water.      DISCUSSION: A follow up appointment will be scheduled in the Sleep Clinic at [facility].  RV after 30-90 days on new settings.  Please call [phone] with any questions.      I certify that I have reviewed the entire raw data recording prior to the issuance of this report in accordance with the Standards of Accreditation of the American Academy of Sleep Medicine (AASM)  Melvyn Novas, M.D. Diplomat, Biomedical engineer of Psychiatry and Neurology  Diplomat, Biomedical engineer of Sleep Medicine Wellsite geologist, [employer] [facility] [employer]

## 2020-03-26 NOTE — Addendum Note (Signed)
Addended byNicholas Lose,  L on: 03/26/2020 01:03 PM   Modules accepted: Orders

## 2020-03-26 NOTE — Patient Instructions (Addendum)
Please continue using your CPAP regularly. While your insurance requires that you use CPAP at least 4 hours each night on 70% of the nights, I recommend, that you not skip any nights and use it throughout the night if you can. Getting used to CPAP and staying with the treatment long term does take time and patience and discipline. Untreated obstructive sleep apnea when it is moderate to severe can have an adverse impact on cardiovascular health and raise her risk for heart disease, arrhythmias, hypertension, congestive heart failure, stroke and diabetes. Untreated obstructive sleep apnea causes sleep disruption, nonrestorative sleep, and sleep deprivation. This can have an impact on your day to day functioning and cause daytime sleepiness and impairment of cognitive function, memory loss, mood disturbance, and problems focussing. Using CPAP regularly can improve these symptoms.  We will start Buspar 10mg  daily at bedtime  I will reach out to you as soon as Dr has read titration report. Will make changes in settings based on her recommendations.   Continue close follow up with care team.   Will follow up in 6 months, sooner pending review of compliance data 4-6 weeks following adjustments. Call Vickey Huger if you need anything.   Sleep Apnea Sleep apnea affects breathing during sleep. It causes breathing to stop for a short time or to become shallow. It can also increase the risk of:  Heart attack.  Stroke.  Being very overweight (obese).  Diabetes.  Heart failure.  Irregular heartbeat. The goal of treatment is to help you breathe normally again. What are the causes? There are three kinds of sleep apnea:  Obstructive sleep apnea. This is caused by a blocked or collapsed airway.  Central sleep apnea. This happens when the brain does not send the right signals to the muscles that control breathing.  Mixed sleep apnea. This is a combination of obstructive and central sleep apnea. The  most common cause of this condition is a collapsed or blocked airway. This can happen if:  Your throat muscles are too relaxed.  Your tongue and tonsils are too large.  You are overweight.  Your airway is too small.   What increases the risk?  Being overweight.  Smoking.  Having a small airway.  Being older.  Being male.  Drinking alcohol.  Taking medicines to calm yourself (sedatives or tranquilizers).  Having family members with the condition. What are the signs or symptoms?  Trouble staying asleep.  Being sleepy or tired during the day.  Getting angry a lot.  Loud snoring.  Headaches in the morning.  Not being able to focus your mind (concentrate).  Forgetting things.  Less interest in sex.  Mood swings.  Personality changes.  Feelings of sadness (depression).  Waking up a lot during the night to pee (urinate).  Dry mouth.  Sore throat. How is this diagnosed?  Your medical history.  A physical exam.  A test that is done when you are sleeping (sleep study). The test is most often done in a sleep lab but may also be done at home. How is this treated?  Sleeping on your side.  Using a medicine to get rid of mucus in your nose (decongestant).  Avoiding the use of alcohol, medicines to help you relax, or certain pain medicines (narcotics).  Losing weight, if needed.  Changing your diet.  Not smoking.  Using a machine to open your airway while you sleep, such as: ? An oral appliance. This is a mouthpiece that shifts your lower  jaw forward. ? A CPAP device. This device blows air through a mask when you breathe out (exhale). ? An EPAP device. This has valves that you put in each nostril. ? A BPAP device. This device blows air through a mask when you breathe in (inhale) and breathe out.  Having surgery if other treatments do not work. It is important to get treatment for sleep apnea. Without treatment, it can lead to:  High blood  pressure.  Coronary artery disease.  In men, not being able to have an erection (impotence).  Reduced thinking ability.   Follow these instructions at home: Lifestyle  Make changes that your doctor recommends.  Eat a healthy diet.  Lose weight if needed.  Avoid alcohol, medicines to help you relax, and some pain medicines.  Do not use any products that contain nicotine or tobacco, such as cigarettes, e-cigarettes, and chewing tobacco. If you need help quitting, ask your doctor. General instructions  Take over-the-counter and prescription medicines only as told by your doctor.  If you were given a machine to use while you sleep, use it only as told by your doctor.  If you are having surgery, make sure to tell your doctor you have sleep apnea. You may need to bring your device with you.  Keep all follow-up visits as told by your doctor. This is important. Contact a doctor if:  The machine that you were given to use during sleep bothers you or does not seem to be working.  You do not get better.  You get worse. Get help right away if:  Your chest hurts.  You have trouble breathing in enough air.  You have an uncomfortable feeling in your back, arms, or stomach.  You have trouble talking.  One side of your body feels weak.  A part of your face is hanging down. These symptoms may be an emergency. Do not wait to see if the symptoms will go away. Get medical help right away. Call your local emergency services (911 in the [place] Do not drive yourself to the hospital. Summary  This condition affects breathing during sleep.  The most common cause is a collapsed or blocked airway.  The goal of treatment is to help you breathe normally while you sleep. This information is not intended to replace advice given to you by your health care provider. Make sure you discuss any questions you have with your health care provider. Document Revised: 11/19/2017 Document Reviewed:  09/28/2017 Elsevier Patient Education  2021 [employer].

## 2020-03-26 NOTE — Progress Notes (Signed)
PATIENT'S NAME:  Jason Munoz, Jason Munoz DOB:      [birth year]      MR#:    [id number]     DATE OF RECORDING: 03/21/2020 M. Baird Kay M.D.:  Shawnie Dapper, NP and Geoffry Paradise, MD Study Performed:   Titration to positive airway pressure  HISTORY:  12/21/19 Jason Munoz is a 65year-old patient who was seen for multiple Rv by Sherril Croon. He had undergone a sleep study on 11-20-2016 and was diagnosed with mostly obstructive - complex sleep apnea at AHI of only 7.9/h, RDI 9.3/h -  REM AHI 16.5/h. 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   I reviewed the patient's download ; he has used the machine on every of the last 30 days he has not always been able to use it for longer than 2 hours, the average user time is 2 hours and 54 minutes on the, he uses CPAP with a set pressure of 13 cmH2O and 3 cm EPR, his residual AHI is 19.3/h (!!) - almost double the apnea count he had a baseline, and these are central dominant apneas.   My suspicion is that the patient has treatment emergent central apnea and needs to be titrated to a BiPAP, air leakage seems not to be the problem. He is using a nasal pillow, which he likes.  The patient endorsed the Epworth Sleepiness Scale at 13 points and the Fatigue Score at 28 points.   The patient's weight 200 pounds with a height of 66 (inches), resulting in a BMI of 32.2 kg/m2.  CURRENT MEDICATIONS: Aspirin 81, Lipitor, Nexium, Toprol-XL, Multivitamin PO, Nitroglycerin, Flonase, Plavix, Protonix, Flomax, Anoror Ellipta, Valtrex, Ventolin HFA, Nitrostat    PROCEDURE:  This is a multichannel digital polysomnogram utilizing the SomnoStar 11.2 system.  Electrodes and sensors were applied and monitored per AASM Specifications.   EEG, EOG, Chin and Limb EMG, were sampled at 200 Hz.  ECG, Snore and Nasal Pressure, Thermal Airflow, Respiratory Effort, CPAP Flow and Pressure, Oximetry was sampled at 50 Hz. Digital video and audio were recorded.       CPAP was initiated  under a DreamWear nasal cushion mask in medium size, which is the patient's own. Started at 5 cmH20 with heated humidity per AASM standards the pressure was advanced to a final 13 cmH20.  At a PAP pressure of 13 cmH20, there was a reduction of the AHI to 0.0/h with improvement of sleep apnea. There were only 13 minutes of sleep seen under 13 cm water. There was poor sleep efficiency noted and most of sleep was REM sleep -which is not prone to central apnea.   Lights Out was at 21:38 and Lights On at 04:08. Total recording time (TRT) was 391 minutes, with a total sleep time (TST) of 226.5 minutes. The patient's sleep latency was 98.5 minutes. REM latency was 119 minutes.  The sleep efficiency was 57.9 %.    SLEEP ARCHITECTURE: WASO (Wake after sleep onset) was 71 minutes.  There were 18 minutes in Stage N1, 160.5 minutes Stage N2, 4 minutes Stage N3 and 44 minutes in Stage REM.  The percentage of Stage N1 was 7.9%, Stage N2 was 70.9%, Stage N3 was 1.8% and Stage R (REM sleep) was 19.4%.      RESPIRATORY ANALYSIS:  There was a total of 26 respiratory events: 3 obstructive apneas, 1 central apnea and 10 mixed apneas with a total of 14 apneas and an apnea index (AI) of 3.7 /hour.  There were 12 hypopneas with a hypopnea index of 3.2/hour.   The total APNEA/HYPOPNEA INDEX (AHI) was 6.9 /hour .  4 events occurred in REM sleep and 22 events in NREM. The REM AHI was 5.5 /hour versus a non-REM AHI of 7.2 /hour.  The patient spent 126 minutes of total sleep time in the supine position and 101 minutes in non-supine.  The supine AHI was 7.2, versus a non-supine AHI of 6.6.  OXYGEN SATURATION & C02:  The baseline 02 saturation was 96%, with the lowest being 87%. Time spent below 89% saturation equaled 1 minutes.  The arousals were noted as: 24 were spontaneous, 0 were associated with PLMs, 2 were associated with respiratory events. The patient had a total of 0 Periodic Limb Movements.   Audio and video  analysis did not show any abnormal or unusual movements, behaviors, phonations or vocalizations.   EKG was in keeping with regular sinus rhythm (NSR).  DIAGNOSIS 1. Obstructive Sleep Apnea responded here to 13 cm water pressure without EPR- under the ale ready used DreamWear mask.  2. No treatment emergent Central Sleep Apnea was noted here.      PLANS/RECOMMENDATIONS: 1. Follow-up with referring physician. We will continue to use CPAP but reduce the EPR setting to 1 cm h20, allowing for a pressure range from 7 cm to 15 cm water.      DISCUSSION: A follow up appointment will be scheduled in the Sleep Clinic at [facility].  RV after 30-90 days on new settings.  Please call [phone] with any questions.      I certify that I have reviewed the entire raw data recording prior to the issuance of this report in accordance with the Standards of Accreditation of the American Academy of Sleep Medicine (AASM)   , M.D. Diplomat, American Board of Psychiatry and Neurology  Diplomat, American Board of Sleep Medicine Medical Director, [facility]     

## 2020-03-28 NOTE — Progress Notes (Signed)
CM sent to [facility]  

## 2020-03-29 ENCOUNTER — Telehealth: Payer: Self-pay

## 2020-03-29 NOTE — Telephone Encounter (Signed)
   [facility] Pre-operative Risk Assessment    HEARTCARE STAFF: - Please ensure there is not already an duplicate clearance open for this procedure. - Under Visit Info/Reason for Call, type in Other and utilize the format Clearance MM/DD/YY or Clearance TBD. Do not use dashes or single digits. - If request is for dental extraction, please clarify the # of teeth to be extracted.  Request for surgical clearance:  1. What type of surgery is being performed? L1-2, L5-S1 Translaminar epidural steroid injection   2. When is this surgery scheduled? 04/25/20   3. What type of clearance is required (medical clearance vs. Pharmacy clearance to hold med vs. Both)? Pharmacy  4. Are there any medications that need to be held prior to surgery and how long? Plavix/ASA   5. Practice name and name of physician performing surgery? [facility]   6. What is the office phone number? [phone]   7.   What is the office fax number? [phone]  [phone]  8.   Anesthesia type (None, local, MAC, general) ? Unknown    Jason Munoz 03/29/2020, 10:14 AM  _________________________________________________________________   (provider comments below)

## 2020-03-29 NOTE — Telephone Encounter (Signed)
   Primary Cardiologist: Peter Swaziland, MD   Chart reviewed as part of pre-operative protocol coverage. Jason Munoz was last seen on 11/06/19 by Dr. Swaziland - cardiac hx includes CAD, HLD, AAA, OSA, tobacco abuse. Seen in 07/2019 with worsening DOE with cardiac cath 08/2019 showing  100% proximal to mid RCA occlusion, patent proximal LAD stent, 60% mid LAD disease, 40% OM1 lesion.  It was felt that his symptom may be related to RCA CTO, therefore he was brought back to the hospital on 09/06/2019 for CTO PCI.  He eventually received Synergy 2.5 x 32 mm DES to RCA. Postprocedure, he was placed on aspirin and Plavix with instruction to continue this for 1 year (1 year mark will be 08/2020). Will route to Dr. Swaziland to find out if there was any leeway on the holding of antiplatelets given request to hold DAPT for for L1-2, L5-S1 Translaminar epidural steroid injection below.  Dr. Swaziland - Please route response to P [facility] DIV PREOP (the pre-op pool). Thank you.  Laurann Montana, PA-C 03/29/2020, 1:54 PM

## 2020-03-30 NOTE — Telephone Encounter (Signed)
He is over 6 months out so could hold Plavix now for procedure but I would continue ASA. I would not recommend holding both for at least a year.   [place] MD, [facility]

## 2020-04-01 NOTE — Telephone Encounter (Signed)
Callback team, please find out from requesting office whether procedure can be done on aspirin. If not, will need to defer since Dr. Swaziland is OK with holding Plavix if needed but does not think patient can hold both ASA and Plavix at this time. If they are OK with this, preop team today can address final clearance recommendations.

## 2020-04-01 NOTE — Telephone Encounter (Signed)
Spoke with [facility] and they state that the patient does not need to hold the [place] for the procedure. I have given the information regarding holding the clopidogrel and also made them aware that I would fax via epic fax function.

## 2020-04-04 NOTE — Telephone Encounter (Signed)
   Primary Cardiologist: Peter Swaziland, MD  Chart reviewed as part of pre-operative protocol coverage. Given past medical history and time since last visit, based on ACC/AHA guidelines, ROGER KETTLES would be at acceptable risk for the planned procedure without further cardiovascular testing.   Per Dr. Swaziland, the patient may hold Plavix as he is greater than 6 months out from PCI however will need to continue ASA throughout the perioperative time.   I will route this recommendation to the requesting party via Epic fax function and remove from pre-op pool.  Please call with questions.  Georgie Chard, NP 04/04/2020, 3:27 PM

## 2020-04-15 ENCOUNTER — Encounter: Payer: Self-pay | Admitting: Podiatry

## 2020-04-15 ENCOUNTER — Other Ambulatory Visit: Payer: Self-pay

## 2020-04-15 ENCOUNTER — Ambulatory Visit (INDEPENDENT_AMBULATORY_CARE_PROVIDER_SITE_OTHER): Payer: 59

## 2020-04-15 ENCOUNTER — Ambulatory Visit: Payer: 59 | Admitting: Podiatry

## 2020-04-15 DIAGNOSIS — M7661 Achilles tendinitis, right leg: Secondary | ICD-10-CM | POA: Diagnosis not present

## 2020-04-15 MED ORDER — TRIAMCINOLONE ACETONIDE 10 MG/ML IJ SUSP
10.0000 mg | Freq: Once | INTRAMUSCULAR | Status: AC
  Administered 2020-04-15: 10 mg

## 2020-04-15 NOTE — Progress Notes (Signed)
Subjective:   Patient ID: Jason Munoz, male   DOB: 66 y.o.   MRN: [id number]   HPI Patient presents stating he is having a lot of pain in the back of his right heel and its been present for around him 3 months he does not remember specific injury.  States stroke is not the same Jason Munoz the past and it is hard for him to walk   ROS      Objective:  Physical Exam  Neurovascular status of ChloraPrep with exquisite discomfort posterior lateral aspect right heel at the insertion of the tendon into the calcaneus with mild equinus condition noted     Assessment:  Inflammatory Achilles tendinitis right lateral side with no indication center or medial involvement     Plan:  H&P reviewed condition and today I did sterile prep and injected the lateral side of the tendon after first explaining the risk of rupture associated with that with 3 mg Dexasone Kenalog 5 mg Xylocaine.  To further prevent any long-term issues I applied air fracture Ropp with instructions on usage discussed long-term orthotics which I will make with a [place] type with a deep heel seat and lift in the heel and he will reappoint 3 weeks to decide on this course.  Patient encouraged to call questions concerns  X-rays indicate there is some spurring within the posterior heel but it is more proximal within the talus and does not involve the posterior calcaneus

## 2020-04-26 ENCOUNTER — Other Ambulatory Visit: Payer: Self-pay | Admitting: Pulmonary Disease

## 2020-05-03 NOTE — Progress Notes (Signed)
Cardiology Office Note:    Date:  05/06/2020   ID:  Jason Munoz, DOB 03-25-1955, MRN [id number]  PCP:  Geoffry ParadiseAronson, Richard, MD  [facility] Cardiologist:   [place], MD  [facility] Electrophysiologist:  None   Referring MD: Geoffry ParadiseAronson, Richard, MD   Chief Complaint  Patient presents with  . Coronary Artery Disease    History of Present Illness:    Jason Munoz is a 65 y.o. male with a hx of CAD, hyperlipidemia, AAA, obstructive sleep apnea, GERD and tobacco abuse. He underwent BMS to LAD in 2006.   He was seen by Azalee CourseHao Meng PA-C on 08/04/2019. He was complaining of worsening dyspnea on exertion at the time. He quit smoking in April however has since noticing intermittent chest tightness across the entire chest.   Echocardiogram obtained on 08/24/2019 showed EF 60 to 65%, grade 1 DD.  Myoview obtained on 08/17/2019 showed EF 46%, small defect of mild severity present in the basal inferior and mid inferior location, this is consistent with ischemia, overall it was a low risk study. He continued to have symptoms so a  Cardiac catheterization was performed on 09/01/2019 revealed single-vessel disease with 100% proximal to mid RCA occlusion, patent proximal LAD stent, 60% mid LAD disease, 40% OM1 lesion.  Normal right heart pressure.  It was felt that his symptom may be related to RCA CTO, therefore he was brought back to the hospital on 09/06/2019 for CTO PCI.  He eventually received Synergy 2.5 x 32 mm DES to RCA.  Postprocedure, he was placed on aspirin and Plavix with instruction to continue this for 1 year.   Ultrasound obtained in 2020 showed AAA measuring at 3.4 cm.   CT abdomen and pelvis with contrast on 08/03/2019, this revealed a stable 3.4 cm infrarenal abdominal aortic aneurysm. Recommend follow-up ultrasound in 3 years.   In the fall he was seen in the lipid clinic and started on Praluent.  On follow up today he is doing well. He states he was having a lot of trouble with his  breathing and feeling tired during the day. He had a new sleep study and his CPAP was adjusted. Since then his breathing is great and he no longer feels fatigued. No chest pain.   Past Medical History:  Diagnosis Date  . AAA (abdominal aortic aneurysm) (HCC) 09/2018   3.4cm  . Allergy   . Anxiety   . Arthritis   . Colitis   . Coronary artery disease 12/06   stent LAD in 2006  . COVID-19 01/2020  . GERD (gastroesophageal reflux disease)   . HTN (hypertension)   . Hypercholesteremia   . Myocardial infarction (HCC)   . Sleep apnea    uses cpap  . Snoring   . Substance abuse (HCC)   . Umbilical hernia   . Ventral hernia     Past Surgical History:  Procedure Laterality Date  . 2006    . APPENDECTOMY    . bilateral ankle fractures    . CARPAL TUNNEL RELEASE    . COLONOSCOPY    . CORONARY ANGIOGRAPHY N/A 09/06/2019   Procedure: CORONARY ANGIOGRAPHY;  Surgeon: SwazilandJordan,  M, MD;  [facility];  Service: Cardiovascular;  Laterality: N/A;  . CORONARY CTO INTERVENTION N/A 09/06/2019   Procedure: CORONARY CTO INTERVENTION;  Surgeon: SwazilandJordan,  M, MD;  [facility];  Service: Cardiovascular;  Laterality: N/A;  RCA  . ELBOW SURGERY Right   . EXTERNAL  EAR SURGERY    . INTRAVASCULAR ULTRASOUND/IVUS N/A 09/06/2019   Procedure: Intravascular Ultrasound/IVUS;  Surgeon: [place]il[place],  M, MD;  Location: [facility] INVASIVE CV LAB;  Service: Cardiovascular;  Laterality: N/A;  . NECK SURGERY    . RIGHT/LEFT HEART CATH AND CORONARY ANGIOGRAPHY N/A 09/01/2019   Procedure: RIGHT/LEFT HEART CATH AND CORONARY ANGIOGRAPHY;  Surgeon: [place],  M, MD;  Location: [facility] INVASIVE CV LAB;  Service: Cardiovascular;  Laterality: N/A;  . UPPER GASTROINTESTINAL ENDOSCOPY      Current Medications: Current Meds  Medication Sig  . albuterol (VENTOLIN HFA) 108 (90 Base) MCG/ACT inhaler TAKE 2 PUFFS BY MOUTH EVERY 6 HOURS AS NEEDED FOR WHEEZE OR SHORTNESS OF BREATH  . Alirocumab  (PRALUENT) 150 MG/ML SOAJ Inject 150 mg into the skin every 14 (fourteen) days.  Marland Kitchen aspirin 81 MG chewable tablet Chew 81 mg by mouth daily.   Marland Kitchen atorvastatin (LIPITOR) 40 MG tablet Take 1 tablet (40 mg total) by mouth daily.  . busPIRone (BUSPAR) 10 MG tablet Take 1 tablet (10 mg total) by mouth at bedtime.  . celecoxib (CELEBREX) 200 MG capsule Take 200 mg by mouth daily.  . clopidogrel (PLAVIX) 75 MG tablet Take 1 tablet (75 mg total) by mouth daily.  Marland Kitchen escitalopram (LEXAPRO) 10 MG tablet Take 10 mg by mouth daily.   Marland Kitchen esomeprazole (NEXIUM) 40 MG capsule Take 1 capsule (40 mg total) by mouth daily at 12 noon.  . metoprolol succinate (TOPROL-XL) 25 MG 24 hr tablet Take 25 mg by mouth every evening.  . Multiple Vitamin (MULTIVITAMIN WITH MINERALS) TABS tablet Take 1 tablet by mouth daily with lunch.  . nitroGLYCERIN (NITROSTAT) 0.4 MG SL tablet Place 1 tablet (0.4 mg total) under the tongue every 5 (five) minutes as needed for chest pain (Up to 3 doses. If chest pain persistent after the 3rd dose, call 911).  . pantoprazole (PROTONIX) 40 MG tablet Take 40 mg by mouth 2 (two) times daily.  . sildenafil (VIAGRA) 100 MG tablet one tablet PO 1 hour prior to intercourse  . tamsulosin (FLOMAX) 0.4 MG CAPS capsule Take 0.4 mg by mouth every evening.   . valACYclovir (VALTREX) 500 MG tablet Take 500 mg by mouth daily.   . [DISCONTINUED] atorvastatin (LIPITOR) 80 MG tablet Take 1 tablet (80 mg total) by mouth daily.  . [DISCONTINUED] esomeprazole (NEXIUM) 40 MG capsule      Allergies:   Patient has no known allergies.   Social History   Socioeconomic History  . Marital status: Married    Spouse name: Not on file  . Number of children: 3  . Years of education: HS  . Highest education level: Not on file  Occupational History  . Occupation: retired     Associate Professor: [employer]  Tobacco Use  . Smoking status: Former Smoker    Packs/day: 1.50    Years: 33.00    Pack years: 49.50    Types:  Cigarettes    Quit date: 06/04/2019    Years since quitting: 0.9  . Smokeless tobacco: Former Clinical biochemist  . Vaping Use: Former  Substance and Sexual Activity  . Alcohol use: Never  . Drug use: No  . Sexual activity: Yes    Birth control/protection: None  Other Topics Concern  . Not on file  Social History Narrative   Lives at home with his wife.   Right-handed.   No caffeine use.   Social Determinants of Health   Financial Resource Strain: Not on  file  Food Insecurity: Not on file  Transportation Needs: Not on file  Physical Activity: Not on file  Stress: Not on file  Social Connections: Not on file     Family History: The patient's family history includes COPD in his father. There is no history of Stomach cancer, Rectal cancer, Esophageal cancer, Colon cancer, or Colon polyps.  ROS:   Please see the history of present illness.     All other systems reviewed and are negative.  EKGs/Labs/Other Studies Reviewed:    The following studies were reviewed today:  Cath 09/01/2019  Prox LAD lesion is 35% stenosed.  Mid LAD lesion is 60% stenosed.  Prox RCA to Mid RCA lesion is 100% stenosed.  1st Mrg lesion is 40% stenosed.  LV end diastolic pressure is normal.   1. Single vessel occlusive CAD involving the RCA. The prior stent in the proximal LAD is still patent. There is a borderline stenosis in the mid LAD 2. Normal LV filling pressures. 3. Normal right heart pressures 4. Normal cardiac output.  Plan: based on findings today and recent stress test I feel his symptoms are related to the RCA occlusion. Symptoms have been present for 3-4 months. He is on good medical therapy. Options are to attempt CTO PCI versus increasing medical therapy. Will discuss with patient.    Cath 09/06/2019  Prox LAD lesion is 35% stenosed.  Mid LAD lesion is 60% stenosed.  1st Mrg lesion is 40% stenosed.  Prox RCA to Mid RCA lesion is 100% stenosed.  Post intervention,  there is a 0% residual stenosis.  A drug-eluting stent was successfully placed using a SYNERGY XD 2.50X32.   1. Successful CTO PCI of the mid RCA with DES x 1 and IVUS guidance  Plan: observe overnight . Anticipate DC in am. DAPT for one year.      EKG:  EKG is not ordered today.   Recent Labs: 09/07/2019: BUN 19; Creatinine, Ser 1.44; Hemoglobin 13.4; Platelets 205; Potassium 4.3; Sodium 139  Recent Lipid Panel    Component Value Date/Time   CHOL 159 11/06/2019 1132   TRIG 176 (H) 11/06/2019 1132   HDL 47 11/06/2019 1132   CHOLHDL 3.4 11/06/2019 1132   LDLCALC 82 11/06/2019 1132     Dated 01/30/19: cholesterol 190, triglycerides 107, HDL 40, LDL 129.  Dated 08/03/19: A1c 5.8%.LFTs and TSH normal Dated 09/07/19: creatinine 1.44. otherwise BMET and CBC normal. Dated 03/11/20: cholesterol 64, triglycerides 90, HDL 36, LDL 10. potassium 5. LFTs normal.  Physical Exam:    VS:  BP (!) 130/92   Pulse 67   Ht 5\' 6"  (1.676 m)   Wt 190 lb (86.2 kg)   BMI 30.67 kg/m     Wt Readings from Last 3 Encounters:  05/06/20 190 lb (86.2 kg)  03/26/20 195 lb 6.4 oz (88.6 kg)  03/11/20 193 lb 6.4 oz (87.7 kg)     GEN:  Well nourished, well developed in no acute distress HEENT: Normal NECK: No JVD; No carotid bruits LYMPHATICS: No lymphadenopathy CARDIAC: RRR, no murmurs, rubs, gallops RESPIRATORY:  Clear to auscultation without rales, wheezing or rhonchi  ABDOMEN: Soft, non-tender, non-distended MUSCULOSKELETAL:  No edema; No deformity  SKIN: Warm and dry NEUROLOGIC:  Alert and oriented x 3 PSYCHIATRIC:  Normal affect   ASSESSMENT:    1. Coronary artery disease involving native coronary artery of native heart without angina pectoris   2. AAA (abdominal aortic aneurysm) without rupture (HCC)   3. Hyperlipidemia LDL  goal <70   4. Former tobacco use   5. Essential hypertension    PLAN:    In order of problems listed above:  1. CAD: s/p remote BMS of LAD. S/p CTO PCI of RCA  in July 2021.  He has been compliant with aspirin and Plavix. No angina. Dyspnea is improved.   2. Hyperlipidemia:  Now on Praluent with great response. LDL down to 10. Will reduce atorvastatin to 40 mg daily.  3. AAA: Previous CTA of the abdomen obtained on 08/03/2019 showed a stable 3.4 cm infrarenal abdominal aortic aneurysm, recommend follow-up ultrasound in 3 years.  4.   Dyspnea with history of tobacco abuse. Improved with CPAP adjustment.   Medication Adjustments/Labs and Tests Ordered: Current medicines are reviewed at length with the patient today.  Concerns regarding medicines are outlined above.  No orders of the defined types were placed in this encounter.  Meds ordered this encounter  Medications  . atorvastatin (LIPITOR) 40 MG tablet    Sig: Take 1 tablet (40 mg total) by mouth daily.    Dispense:  90 tablet    Refill:  3    Patient Instructions  Reduce atorvastatin to 40 mg daily      Signed,  [place], MD  05/06/2020 12:04 PM    [facility]

## 2020-05-06 ENCOUNTER — Encounter: Payer: Self-pay | Admitting: Cardiology

## 2020-05-06 ENCOUNTER — Ambulatory Visit: Payer: 59 | Admitting: Podiatry

## 2020-05-06 ENCOUNTER — Ambulatory Visit (INDEPENDENT_AMBULATORY_CARE_PROVIDER_SITE_OTHER): Payer: 59 | Admitting: Cardiology

## 2020-05-06 ENCOUNTER — Other Ambulatory Visit: Payer: Self-pay

## 2020-05-06 VITALS — BP 130/92 | HR 67 | Ht 66.0 in | Wt 190.0 lb

## 2020-05-06 DIAGNOSIS — I714 Abdominal aortic aneurysm, without rupture, unspecified: Secondary | ICD-10-CM

## 2020-05-06 DIAGNOSIS — I2510 Atherosclerotic heart disease of native coronary artery without angina pectoris: Secondary | ICD-10-CM | POA: Diagnosis not present

## 2020-05-06 DIAGNOSIS — I10 Essential (primary) hypertension: Secondary | ICD-10-CM

## 2020-05-06 DIAGNOSIS — E785 Hyperlipidemia, unspecified: Secondary | ICD-10-CM

## 2020-05-06 DIAGNOSIS — Z87891 Personal history of nicotine dependence: Secondary | ICD-10-CM

## 2020-05-06 MED ORDER — ATORVASTATIN CALCIUM 40 MG PO TABS: 40.0000 mg | ORAL_TABLET | Freq: Every day | ORAL | 3 refills | Status: DC

## 2020-05-06 NOTE — Patient Instructions (Signed)
Reduce atorvastatin to 40mg daily

## 2020-05-14 ENCOUNTER — Telehealth: Payer: Self-pay | Admitting: Family Medicine

## 2020-05-14 NOTE — Telephone Encounter (Signed)
Please let him know that I have reviewed most recent data report. He is doing better!!! Daily compliance is 100% and 4 hour use now 60%. Dr Dohmeier switched his CPAP settings to a auto titrating setting between 7 and 15cmH20. His AHI is better! Now 5.7. I am very proud and feel that in a few months, he will be on track. Let's get him back in for follow up in about 6 months. TY!

## 2020-05-14 NOTE — Telephone Encounter (Signed)
Contacted pt to inform him Amy has reviewed most recent data report. He is doing better!!! Daily compliance is 100% and 4 hour use now 60%. Dr Dohmeier switched his CPAP settings to a auto titrating setting between 7 and 15cmH20. His AHI is better! Now 5.7.  She is very proud and feel that in a few months, he will be on track. She wanted to follow up in about 6 months so I scheduled him an appointment for Sept 26, 2022 at 11:30, advised him to arrive by 11-11:15. He understood.

## 2020-06-26 ENCOUNTER — Telehealth: Payer: Self-pay | Admitting: Family Medicine

## 2020-06-26 DIAGNOSIS — G4739 Other sleep apnea: Secondary | ICD-10-CM

## 2020-06-26 DIAGNOSIS — G4731 Primary central sleep apnea: Secondary | ICD-10-CM

## 2020-06-26 NOTE — Telephone Encounter (Signed)
Pt's wife, Kervens Roper (on [place]) called, he's having breathing issues all throughout the day and night. Have been to PCP, said related to his CPAP. Can he be worked in for a sooner appt to readjust his CPAP? Would like call from the nurse.

## 2020-06-27 NOTE — Telephone Encounter (Signed)
Reviewed the patient's sleep study from February.  The CPAP titration study in February was ordered due to the patient having central events.  During the night of the study these events did not occur.  However, at this time patient is having central events emerge under the auto CPAP.  These events are averaging at AHI 11.1 with 8.5 being central apnea.  More recently since the beginning of May the events have gotten as high as mid 20's.  Based off of this finding the patient would be able to come in for a another titration study.  This would allow them to start the CPAP at a pressure of 13 cm water pressure which is where his last sleep study showed the best treatment.  If the patient starts to throw central apnea they can pursue BiPAP during the study.  Per the sleep lab manager insurance should allow for the patient to come in for this study since he is starting to throw central events again. I will place the order and notate this in the comments.

## 2020-06-27 NOTE — Telephone Encounter (Signed)
Jason Munoz was agreeable to this plan.  Order has been placed.  Called the patient to make him aware of this plan.  Patient verbalized understanding and was agreeable to the titration study.  Advised the patient that the sleep lab will call to get him scheduled.  In regards to him giving out of breath easily during the daytime, encouraged the patient to touch base with pulmonologist to address that to make sure oxygen level and everything was ok. Pt verbalized understanding. Pt will wait to hear from the sleep lab to get scheduled.

## 2020-07-04 ENCOUNTER — Other Ambulatory Visit: Payer: Self-pay | Admitting: Physician Assistant

## 2020-07-09 ENCOUNTER — Encounter: Payer: Self-pay | Admitting: Pulmonary Disease

## 2020-07-09 ENCOUNTER — Ambulatory Visit: Payer: 59 | Admitting: Pulmonary Disease

## 2020-07-09 ENCOUNTER — Other Ambulatory Visit: Payer: Self-pay

## 2020-07-09 VITALS — BP 118/76 | HR 65 | Temp 98.1°F | Ht 66.0 in | Wt 196.0 lb

## 2020-07-09 DIAGNOSIS — J438 Other emphysema: Secondary | ICD-10-CM | POA: Diagnosis not present

## 2020-07-09 DIAGNOSIS — G4731 Primary central sleep apnea: Secondary | ICD-10-CM | POA: Diagnosis not present

## 2020-07-09 MED ORDER — UMECLIDINIUM-VILANTEROL 62.5-25 MCG/INH IN AEPB: 1.0000 | INHALATION_SPRAY | Freq: Every day | RESPIRATORY_TRACT | 6 refills | Status: DC

## 2020-07-09 MED ORDER — ALBUTEROL SULFATE HFA 108 (90 BASE) MCG/ACT IN AERS
1.0000 | INHALATION_SPRAY | RESPIRATORY_TRACT | 6 refills | Status: DC | PRN
Start: 2020-07-09 — End: 2021-11-17

## 2020-07-09 NOTE — Patient Instructions (Addendum)
Re-start anoro ellipta 1 puff daily - take every day regardless of feeling good or bad  Please call us if the anoro is not helping and we will send in prescription for Trelegy Ellipta.   Use albuterol inhaler 1-2 puffs every 4-6 hours as needed for shortness of breath.   Continue to work with [facility] on your complex sleep apnea syndrome.

## 2020-07-09 NOTE — Progress Notes (Signed)
Synopsis: Referred in 10/2019 for dyspnea  Subjective:   PATIENT ID: Etta QuillSteven L Behlke GENDER: male DOB: 22-Mar-1955, MRN: [id number]  HPI  Chief Complaint  Patient presents with  . Follow-up    2 mo f/u. States his breathing has not improved since last visit. Anoro is not working for him.    Nickolas MadridSteven Chamberlin is a 65 year old male, former smoker with obstructive sleep apnea on CPAP, GERD, coronary artery disease and emphysema who returns to pulmonary clinic for follow up.   He has not been using anoro ellipta regularly since last visit, he has been using it as needed. He complains of worsening shortness of breath since last visit. Denies wheezing, cough or mucous production.  He has not been able [facility] his sleep apnea since last visit. He is trying to get scheduled for a CPAP titration study where he might require Bipap therapy due to complex sleep apnea syndrome.   Past Medical History:  Diagnosis Date  . AAA (abdominal aortic aneurysm) (HCC) 09/2018   3.4cm  . Allergy   . Anxiety   . Arthritis   . Colitis   . Coronary artery disease 12/06   stent LAD in 2006  . COVID-19 01/2020  . GERD (gastroesophageal reflux disease)   . HTN (hypertension)   . Hypercholesteremia   . Myocardial infarction (HCC)   . Sleep apnea    uses cpap  . Snoring   . Substance abuse (HCC)   . Umbilical hernia   . Ventral hernia      Family History  Problem Relation Age of Onset  . COPD Father   . Stomach cancer Neg Hx   . Rectal cancer Neg Hx   . Esophageal cancer Neg Hx   . Colon cancer Neg Hx   . Colon polyps Neg Hx      Social History   Socioeconomic History  . Marital status: Married    Spouse name: Not on file  . Number of children: 3  . Years of education: HS  . Highest education level: Not on file  Occupational History  . Occupation: retired     Associate Professormployer: [employer]  Tobacco Use  . Smoking status: Former Smoker    Packs/day:  1.50    Years: 33.00    Pack years: 49.50    Types: Cigarettes    Quit date: 06/04/2019    Years since quitting: 1.0  . Smokeless tobacco: Former Clinical biochemistUser  Vaping Use  . Vaping Use: Former  Substance and Sexual Activity  . Alcohol use: Never  . Drug use: No  . Sexual activity: Yes    Birth control/protection: None  Other Topics Concern  . Not on file  Social History Narrative   Lives at home with his wife.   Right-handed.   No caffeine use.   Social Determinants of Health   Financial Resource Strain: Not on file  Food Insecurity: Not on file  Transportation Needs: Not on file  Physical Activity: Not on file  Stress: Not on file  Social Connections: Not on file  Intimate Partner Violence: Not on file     No Known Allergies   Outpatient Medications Prior to Visit  Medication Sig Dispense Refill  . Alirocumab (PRALUENT) 150 MG/ML SOAJ Inject 150 mg into the skin every 14 (fourteen) days. 2 mL 11  . aspirin 81 MG chewable tablet Chew 81 mg by mouth daily.     Marland Kitchen. atorvastatin (LIPITOR)  40 MG tablet Take 1 tablet (40 mg total) by mouth daily. 90 tablet 3  . busPIRone (BUSPAR) 10 MG tablet Take 1 tablet (10 mg total) by mouth at bedtime. 90 tablet 3  . celecoxib (CELEBREX) 200 MG capsule Take 200 mg by mouth daily.    . clopidogrel (PLAVIX) 75 MG tablet Take 1 tablet (75 mg total) by mouth daily. 90 tablet 3  . escitalopram (LEXAPRO) 10 MG tablet Take 10 mg by mouth daily.     Marland Kitchen esomeprazole (NEXIUM) 40 MG capsule Take 1 capsule (40 mg total) by mouth daily at 12 noon.    Marland Kitchen ipratropium (ATROVENT) 0.06 % nasal spray 1-2 sprays in each nostril    . levocetirizine (XYZAL) 5 MG tablet Take 5 mg by mouth daily.    . metoprolol succinate (TOPROL-XL) 25 MG 24 hr tablet Take 25 mg by mouth every evening.    . Multiple Vitamin (MULTIVITAMIN WITH MINERALS) TABS tablet Take 1 tablet by mouth daily with lunch.    . nitroGLYCERIN (NITROSTAT) 0.4 MG SL tablet Place 1 tablet (0.4 mg total) under  the tongue every 5 (five) minutes as needed for chest pain (Up to 3 doses. If chest pain persistent after the 3rd dose, call 911). 25 tablet 3  . pantoprazole (PROTONIX) 40 MG tablet Take 40 mg by mouth 2 (two) times daily.    . sildenafil (VIAGRA) 100 MG tablet one tablet PO 1 hour prior to intercourse    . tamsulosin (FLOMAX) 0.4 MG CAPS capsule Take 0.4 mg by mouth every evening.     . valACYclovir (VALTREX) 500 MG tablet Take 500 mg by mouth daily.     Marland Kitchen albuterol (VENTOLIN HFA) 108 (90 Base) MCG/ACT inhaler TAKE 2 PUFFS BY MOUTH EVERY 6 HOURS AS NEEDED FOR WHEEZE OR SHORTNESS OF BREATH 6.7 each 6  . umeclidinium-vilanterol (ANORO ELLIPTA) 62.5-25 MCG/INH AEPB Inhale 1 puff into the lungs daily. 60 each 6  . fluticasone (FLONASE) 50 MCG/ACT nasal spray Place 1-2 sprays into both nostrils daily as needed (allergies.).  (Patient not taking: Reported on 05/06/2020)     No facility-administered medications prior to visit.    Review of Systems  Constitutional: Negative for chills, diaphoresis, fever, malaise/fatigue and weight loss.  HENT: Negative for congestion, sinus pain and sore throat.   Eyes: Negative.   Respiratory: Positive for shortness of breath. Negative for cough, hemoptysis, sputum production and wheezing.   Cardiovascular: Negative for chest pain, palpitations, orthopnea, claudication, leg swelling and PND.  Gastrointestinal: Negative for abdominal pain, heartburn, nausea and vomiting.  Genitourinary: Negative.   Musculoskeletal: Negative.   Skin: Negative for rash.  Neurological: Negative.   Endo/Heme/Allergies: Negative.   Psychiatric/Behavioral: Negative.    Objective:   Vitals:   07/09/20 0916  BP: 118/76  Pulse: 65  Temp: 98.1 F (36.7 C)  TempSrc: Temporal  SpO2: 96%  Weight: 196 lb (88.9 kg)  Height: 5\' 6"  (1.676 m)     Physical Exam Constitutional:      General: He is not in acute distress.    Appearance: He is obese. He is not ill-appearing.  HENT:      Head: Normocephalic and atraumatic.  Eyes:     General: No scleral icterus.    Conjunctiva/sclera: Conjunctivae normal.     Pupils: Pupils are equal, round, and reactive to light.  Cardiovascular:     Rate and Rhythm: Normal rate and regular rhythm.     Pulses: Normal pulses.  Heart sounds: Normal heart sounds. No murmur heard.   Pulmonary:     Effort: Pulmonary effort is normal.     Breath sounds: Normal breath sounds.  Abdominal:     General: Bowel sounds are normal.     Palpations: Abdomen is soft.  Musculoskeletal:     Right lower leg: No edema.     Left lower leg: No edema.  Skin:    General: Skin is warm and dry.  Neurological:     General: No focal deficit present.     Mental Status: He is alert.  Psychiatric:        Mood and Affect: Mood normal.        Behavior: Behavior normal.        Thought Content: Thought content normal.        Judgment: Judgment normal.     CBC    Component Value Date/Time   WBC 7.6 09/07/2019 0418   RBC 4.50 09/07/2019 0418   HGB 13.4 09/07/2019 0418   HGB 16.8 08/28/2019 1123   HCT 40.5 09/07/2019 0418   HCT 50.9 08/28/2019 1123   PLT 205 09/07/2019 0418   PLT 213 08/28/2019 1123   MCV 90.0 09/07/2019 0418   MCV 89 08/28/2019 1123   MCH 29.8 09/07/2019 0418   MCHC 33.1 09/07/2019 0418   RDW 15.7 (H) 09/07/2019 0418   RDW 15.9 (H) 08/28/2019 1123   LYMPHSABS 1.1 09/05/2018 1016   MONOABS 0.6 09/05/2018 1016   EOSABS 0.1 09/05/2018 1016   BASOSABS 0.1 09/05/2018 1016    BMP Latest Ref Rng & Units 09/07/2019 09/06/2019 09/01/2019  Glucose 70 - 99 mg/dL 660(Y) 301(S) -  BUN 8 - 23 mg/dL 19 20 -  Creatinine 0.10 - 1.24 mg/dL 9.32(T) 5.57(D) -  BUN/Creat Ratio 10 - 24 - - -  Sodium 135 - 145 mmol/L 139 140 143  Potassium 3.5 - 5.1 mmol/L 4.3 4.8 4.2  Chloride 98 - 111 mmol/L 108 103 -  CO2 22 - 32 mmol/L 23 26 -  Calcium 8.9 - 10.3 mg/dL 9.0 22.0 -   Chest imaging: Low Dose CT Chest 12/06/19 Mediastinum/Nodes: No  enlarged mediastinal, hilar, or axillary lymph nodes. Thyroid gland, trachea, and esophagus demonstrate no significant findings.  Lungs/Pleura: No pleural effusion. Paraseptal and centrilobular emphysema. No airspace consolidation, atelectasis or pneumothorax. Nodule within the anterior basal right upper lobe has an equivalent diameter of 4.9 mm. Calcified granuloma is noted within the medial aspect of the left lung apex.  Lung-RADS 2, benign appearance or behavior.  PFT: PFT Results Latest Ref Rng & Units 12/01/2019  FVC-Pre L 4.07  FVC-Predicted Pre % 102  FVC-Post L 4.05  FVC-Predicted Post % 102  Pre FEV1/FVC % % 77  Post FEV1/FCV % % 80  FEV1-Pre L 3.14  FEV1-Predicted Pre % 105  FEV1-Post L 3.22  DLCO uncorrected ml/min/mmHg 18.65  DLCO UNC% % 78  DLCO corrected ml/min/mmHg 18.65  DLCO COR %Predicted % 78  DLVA Predicted % 67  TLC L 6.65  TLC % Predicted % 109  RV % Predicted % 121   Echo: 08/24/19 EF 60-65%, normal LV function with grade I diastolic dysfunction. RV systolic function and size are normal. No valvular dysfunction  Heart Catheterization: 09/06/19  Prox LAD lesion is 35% stenosed.  Mid LAD lesion is 60% stenosed.  1st Mrg lesion is 40% stenosed.  Prox RCA to Mid RCA lesion is 100% stenosed.  Post intervention, there is a 0% residual stenosis.  A drug-eluting stent was successfully placed using a SYNERGY XD 2.50X32.  1. Successful CTO PCI of the mid RCA with DES x 1 and IVUS guidance Assessment & Plan:   Paraseptal emphysema (HCC) - Plan: umeclidinium-vilanterol (ANORO ELLIPTA) 62.5-25 MCG/INH AEPB, albuterol (VENTOLIN HFA) 108 (90 Base) MCG/ACT inhaler  Complex sleep apnea syndrome  Discussion: Hridhaan Yohn is a 65 year old male, former smoker with obstructive sleep apnea on CPAP, GERD, coronary artery disease and emphysema who returns to pulmonary clinic for follow up.   He is to resume Anoro ellipta 1 puff daily for his emphysema. He  can use albuterol as needed. He is to call our office if he continues to experience shortness of breath after being on the anoro continuously. We will start him on trelegy ellipta next if needed.   The main concern at this time continues to be his complex sleep apnea as he had worsening of his AHI with adjustments to his CPAP pressure. I encouraged him to follow up with [facility] as soon as possible for a titration study as he may require bipap therapy or an AVAPs machine.   He is to follow up in 4 months.  Melody Comas, MD [facility] Office: [phone]    Current Outpatient Medications:  .  Alirocumab (PRALUENT) 150 MG/ML SOAJ, Inject 150 mg into the skin every 14 (fourteen) days., Disp: 2 mL, Rfl: 11 .  aspirin 81 MG chewable tablet, Chew 81 mg by mouth daily. , Disp: , Rfl:  .  atorvastatin (LIPITOR) 40 MG tablet, Take 1 tablet (40 mg total) by mouth daily., Disp: 90 tablet, Rfl: 3 .  busPIRone (BUSPAR) 10 MG tablet, Take 1 tablet (10 mg total) by mouth at bedtime., Disp: 90 tablet, Rfl: 3 .  celecoxib (CELEBREX) 200 MG capsule, Take 200 mg by mouth daily., Disp: , Rfl:  .  clopidogrel (PLAVIX) 75 MG tablet, Take 1 tablet (75 mg total) by mouth daily., Disp: 90 tablet, Rfl: 3 .  escitalopram (LEXAPRO) 10 MG tablet, Take 10 mg by mouth daily. , Disp: , Rfl:  .  esomeprazole (NEXIUM) 40 MG capsule, Take 1 capsule (40 mg total) by mouth daily at 12 noon., Disp: , Rfl:  .  ipratropium (ATROVENT) 0.06 % nasal spray, 1-2 sprays in each nostril, Disp: , Rfl:  .  levocetirizine (XYZAL) 5 MG tablet, Take 5 mg by mouth daily., Disp: , Rfl:  .  metoprolol succinate (TOPROL-XL) 25 MG 24 hr tablet, Take 25 mg by mouth every evening., Disp: , Rfl:  .  Multiple Vitamin (MULTIVITAMIN WITH MINERALS) TABS tablet, Take 1 tablet by mouth daily with lunch., Disp: , Rfl:  .  nitroGLYCERIN (NITROSTAT) 0.4 MG SL tablet, Place 1 tablet (0.4 mg total) under the  tongue every 5 (five) minutes as needed for chest pain (Up to 3 doses. If chest pain persistent after the 3rd dose, call 911)., Disp: 25 tablet, Rfl: 3 .  pantoprazole (PROTONIX) 40 MG tablet, Take 40 mg by mouth 2 (two) times daily., Disp: , Rfl:  .  sildenafil (VIAGRA) 100 MG tablet, one tablet PO 1 hour prior to intercourse, Disp: , Rfl:  .  tamsulosin (FLOMAX) 0.4 MG CAPS capsule, Take 0.4 mg by mouth every evening. , Disp: , Rfl:  .  valACYclovir (VALTREX) 500 MG tablet, Take 500 mg by mouth daily. , Disp: , Rfl:  .  albuterol (VENTOLIN HFA) 108 (90 Base) MCG/ACT inhaler, Inhale 1-2 puffs into the lungs  every 4 (four) hours as needed for wheezing or shortness of breath., Disp: 6.7 each, Rfl: 6 .  umeclidinium-vilanterol (ANORO ELLIPTA) 62.5-25 MCG/INH AEPB, Inhale 1 puff into the lungs daily., Disp: 60 each, Rfl: 6

## 2020-07-16 ENCOUNTER — Telehealth: Payer: Self-pay | Admitting: Pulmonary Disease

## 2020-07-16 MED ORDER — TRELEGY ELLIPTA 100-62.5-25 MCG/INH IN AEPB: 1.0000 | INHALATION_SPRAY | Freq: Every day | RESPIRATORY_TRACT | 5 refills | Status: DC

## 2020-07-16 NOTE — Telephone Encounter (Signed)
Ok for him to stop the anoro and send in a prescription of trelegy ellipta 1 puff daily for him to see if this works better for him and does not experience side effects.   Thanks, Cletis Athens

## 2020-07-16 NOTE — Telephone Encounter (Signed)
I called and spoke with patient regarding message. Patient stated that when he uses the Anoro inhaler that his chest feels tight and cold and he feels nauseous and has to lay down. He is requesting to switch to another inhaler. It looks like Trelegy was mentioned at last OV. Will route to Dr. Francine Graven.  Dr. Francine Graven, please advise if you would like to send in trelegy? Thanks!

## 2020-07-16 NOTE — Telephone Encounter (Signed)
I called and went over Dr. Nickola Major with patient. He verbalized understanding and I sent in Trelegy inhaler to preferred pharmacy. I informed patient to call us if he is still experiencing side effects. Nothing further needed.

## 2020-07-28 IMAGING — NM NM HEPATO W/GB/PHARM/[PERSON_NAME]
2 series · 12 of 12 positions shown · non-contrast
Comparison: None.

CLINICAL DATA: Epigastric pain with nausea and vomiting

EXAM:
NUCLEAR MEDICINE HEPATOBILIARY IMAGING WITH GALLBLADDER EF
VIEWS:
Anterior right upper quadrant
RADIOPHARMACEUTICALS:  4.87 mCi Uc-33m  Choletec IV

[Series 1: biliary · 3.25mm/px · 6 of 59 frames shown]
[frame 5/59]
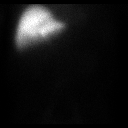
[frame 15/59]
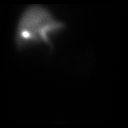
[frame 25/59]
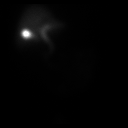
[frame 35/59]
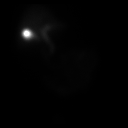
[frame 45/59]
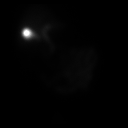
[frame 55/59]
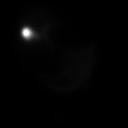

[Series 2: gbef · 3.25mm/px · 6 of 60 frames shown]
[frame 6/60]
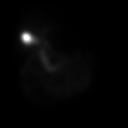
[frame 16/60]
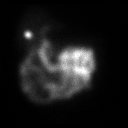
[frame 26/60]
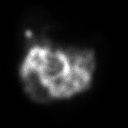
[frame 36/60]
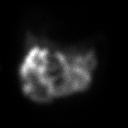
[frame 46/60]
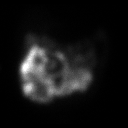
[frame 56/60]
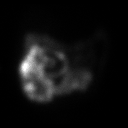

[12 of 12 positions shown; findings below may reference images not displayed]

FINDINGS: Liver uptake of radiotracer is unremarkable. There is prompt
visualization of gallbladder and small bowel, indicating patency of
the cystic and common bile ducts. The patient consumed 8 ounces of
Ensure orally with calculation of the computer generated ejection
fraction of radiotracer from the gallbladder. Patient did not
experience clinical symptoms with the oral Ensure consumption. The
computer generated ejection fraction of radiotracer from the
gallbladder is normal at 98%, normal greater than 33% using the oral
agent.
IMPRESSION: Study within normal limits.

## 2020-08-19 ENCOUNTER — Other Ambulatory Visit: Payer: Self-pay | Admitting: Physician Assistant

## 2020-08-31 ENCOUNTER — Other Ambulatory Visit: Payer: Self-pay | Admitting: Cardiology

## 2020-09-09 ENCOUNTER — Telehealth: Payer: Self-pay

## 2020-09-09 NOTE — Telephone Encounter (Signed)
Pt's wife lvm in the sleep lab on 09/05/20 stating the pt was sick and would not make his sleep study that night. They will call back to r/s once he is feeling better.

## 2020-09-10 ENCOUNTER — Telehealth: Payer: Self-pay | Admitting: Family Medicine

## 2020-09-10 NOTE — Telephone Encounter (Signed)
Pt would like a call to reschedule his sleep study that was missed on 7/21.

## 2020-09-11 ENCOUNTER — Ambulatory Visit: Payer: 59 | Admitting: Family Medicine

## 2020-09-25 ENCOUNTER — Other Ambulatory Visit: Payer: Self-pay | Admitting: Cardiology

## 2020-10-10 ENCOUNTER — Ambulatory Visit (INDEPENDENT_AMBULATORY_CARE_PROVIDER_SITE_OTHER): Payer: 59 | Admitting: Neurology

## 2020-10-10 ENCOUNTER — Other Ambulatory Visit: Payer: Self-pay

## 2020-10-10 DIAGNOSIS — G4709 Other insomnia: Secondary | ICD-10-CM

## 2020-10-10 DIAGNOSIS — G4731 Primary central sleep apnea: Secondary | ICD-10-CM | POA: Diagnosis not present

## 2020-10-10 DIAGNOSIS — G4739 Other sleep apnea: Secondary | ICD-10-CM

## 2020-10-21 NOTE — Addendum Note (Signed)
Addended by: Melvyn Novas on: 10/21/2020 02:03 PM   Modules accepted: Orders

## 2020-10-21 NOTE — Procedures (Signed)
PATIENT'S NAME:  Jason, Munoz DOB:      [birth year]      MR#:    [id number]     DATE OF RECORDING: 10/10/2020 by Johnnette Litter REFERRING M.D.:  Shawnie Dapper, NP  Ferd Hibbs PROVIDER:  Shawnie Dapper, NP  Study Performed:   CPAP  Titration HISTORY:  12-21-2019:  Jason Munoz is a 65 year-old patient who had been seen for multiple Rv by NP Daphine Deutscher. He had undergone a sleep study on 11-20-2016 and was diagnosed with mostly obstructive - but complex sleep apnea at AHI of only 7.9/h, RDI 9.3/h - REM AHI 16.5/h. 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    I reviewed the patient's download ; he has used the machine on every of the last 30 days he has not always been able to use it for longer than 2 hours, the average user time is 2 hours and 54 minutes on the, he uses CPAP with a set pressure of 13 cmH2O and 3 cm EPR, his residual AHI is 19.3/h (!!) - almost double the apnea count he had a baseline, and these are central dominant apneas. He continues to snore and struggle with insomnia.  He may be overtreated.  My suspicion is that the patient may have treatment emergent central apnea and may need to be re-examined as to his baseline, and titrated to a PAP if needed, air leakage seems not to be the problem.  He is using a nasal pillow, which he likes.   The patient endorsed the Epworth Sleepiness Scale at 13/24 points. FSS at 28/63 points, \ Geriatric depression score : N/A.  The patient's weight 201 pounds with a height of 66 (inches), resulting in a BMI of 32.2 kg/m2. The patient's neck circumference measured XXX inches.  CURRENT MEDICATIONS: Praluent, Aspirin, Lipitor, Celebrex, Plavix, Lexapro, Flonase, Toprol-XL, multivitamins, Protonix, Flomax, Anoro Ellipta, Valtrex, Nitrostat    PROCEDURE:  This is a multichannel digital polysomnogram utilizing the SomnoStar 11.2 system.  Electrodes and sensors were applied and monitored per AASM Specifications.   EEG, EOG, Chin and  Limb EMG, were sampled at 200 Hz.  ECG, Snore and Nasal Pressure, Thermal Airflow, Respiratory Effort, CPAP Flow and Pressure, Oximetry was sampled at 50 Hz. Digital video and audio were recorded.       CPAP was initiated at 13 cmH20 with heated humidity per AASM split night standards and pressure was advanced to 14 cmH20 because of hypopneas, apneas and desaturations.  At a PAP pressure of 14 cmH20, there was a reduction of the AHI to 3.2/h with improvement of sleep apnea.  Lights Out was at 21:47 and Lights On at 05:31. Total recording time (TRT) was 464.5 minutes, with a total sleep time (TST) of 319 minutes. The patient's sleep latency was 68.5 minutes. REM latency was 362 minutes.  The sleep efficiency was 68.7 %.    SLEEP ARCHITECTURE: WASO (Wake after sleep onset) was 77 minutes.  There were 32 minutes in Stage N1, 234.5 minutes Stage N2, 24 minutes Stage N3 and 28.5 minutes in Stage REM.  The percentage of Stage N1 was 10.0%, Stage N2 was 73.5%, Stage N3 was 7.5% and Stage R (REM sleep) was 8.9%.  The sleep architecture was notable for fragmentation.  RESPIRATORY ANALYSIS:  There was a total of 25 respiratory events: 5 obstructive apneas, 3 central apneas and 9 mixed apneas with a total of 17 apneas and an apnea index (AI) of 3.2 /hour.  There were 8 hypopneas with a hypopnea index of 1.5/hour.   The total APNEA/HYPOPNEA INDEX (AHI) was 4.7 /hour.  1 events occurred in REM sleep and 24 events in NREM. The REM AHI was 2.1 /hour versus a non-REM AHI of 5.0 /hour.   The patient spent 5 minutes of total sleep time in the supine position and 314 minutes in non-supine. The supine AHI was 0.0, versus a non-supine AHI of 4.7.  OXYGEN SATURATION & C02:  The baseline 02 saturation was 94%, with the lowest being 86%. Time spent below 89% saturation equaled 7 minutes.  The arousals were noted as: 43 were spontaneous, 0 were associated with PLMs, 2 were associated with respiratory events. The patient had  a total of 0 Periodic Limb Movements.   Audio and video analysis did not show any phonations or vocalizations. The patient took the mask off, pulled EOG leads off at 12.05 AM (NREM stage 2).   Snoring was noted. EKG was in keeping with normal sinus rhythm (NSR). Post-study, the patient indicated that sleep was the same as usual.   The patient was fitted with a Medium N30 I nasal cushion mask. He had oral air venting and may need to use a chin strap.   DIAGNOSIS Obstructive Sleep Apnea controlled under 14 cm water pressure CPAP with a nasal mask, but oral venting continued.  Few Central Sleep Apneas emerged.  No Sleep Related Hypoxia was noted.  Mild snoring only. Sleep fragmentation was evidently not related to apnea but promoted by oral venting of air.    PLANS/RECOMMENDATIONS:  CPAP therapy compliance is defined as 4 hours or more of nightly use. Any apnea patient should avoid sedatives, hypnotics, and alcohol consumption at bedtime.   DISCUSSION:  Follow-up with referring provider, using auto CPAP at 7-16 cm water with EPR of 3 chosen interface and chin strap. The patient was fitted with a Medium N30 I nasal cushion mask. If a new CPAP machine is due, please write for autotitration device with 7-16 cm water setting, EPR of 3 cm water.     A follow up appointment will be scheduled in the Sleep Clinic at [facility].   Please call [phone] with any questions.      I certify that I have reviewed the entire raw data recording prior to the issuance of this report in accordance with the Standards of Accreditation of the American Academy of Sleep Medicine (AASM)    Melvyn Novas, M.D. Diplomat, Biomedical engineer of Psychiatry and Neurology  Diplomat, Biomedical engineer of Sleep Medicine Wellsite geologist, [employer] [facility] [employer]

## 2020-10-21 NOTE — Progress Notes (Signed)
1. Obstructive Sleep Apnea controlled under 14 cm water pressure CPAP with a nasal mask, but oral venting continued.  2. Few Central Sleep Apneas emerged.  3. No Sleep Related Hypoxia was noted.  Mild snoring only. 4. Sleep fragmentation was evidently not related to apnea but promoted by oral venting of air.    PLANS/RECOMMENDATIONS:  1. CPAP therapy compliance is defined as 4 hours or more of nightly use. 2. Any apnea patient should avoid sedatives, hypnotics, and alcohol consumption at bedtime.   DISCUSSION:  Follow-up with referring provider, using auto CPAP at 7-16 cm water with EPR of 3 chosen interface and chin strap. The patient was fitted with a Medium N30 I nasal cushion mask. If a new CPAP machine is due, please write for autotitration device with 7-16 cm water setting, EPR of 3 cm water.

## 2020-10-22 ENCOUNTER — Other Ambulatory Visit: Payer: Self-pay | Admitting: Neurology

## 2020-10-22 ENCOUNTER — Telehealth: Payer: Self-pay | Admitting: Neurology

## 2020-10-22 DIAGNOSIS — G4731 Primary central sleep apnea: Secondary | ICD-10-CM

## 2020-10-22 NOTE — Telephone Encounter (Signed)
I called pt. I advised pt that Dr. Vickey Huger reviewed their sleep study results and found that pt was treated at a pressure of 14 cm water pressure. Dr. Vickey Huger recommends that pt continue CPAP. I have discuss with the patient about this finding and  informed I would make the change on [employer]. Order will be sent to the DME company for the pressure change. Pt verbalized understanding.

## 2020-10-22 NOTE — Telephone Encounter (Signed)
After talking with Dr Vickey Huger and reviewing the download and study since pt was best treated at a pressure of 14 cm water pressure we will set the auto CPAP to a set pressure of 14 cm water pressure with extended ramp time. Order has been placed and pt notified of the change. Advised that Amy will review a download after allowing time to get use to the new settings to see if the apnea events are better treated. Pt verbalized understanding.

## 2020-10-22 NOTE — Progress Notes (Signed)
We can set the machine to 14 cm water, which was the sweet spot here. Remarkably few central apneas in in lab seen, in contrast to the download data.

## 2020-10-22 NOTE — Telephone Encounter (Signed)
-----   Message from Judi Cong, RN sent at 10/22/2020  7:45 AM EDT -----  ----- Message ----- From: Melvyn Novas, MD Sent: 10/21/2020   2:03 PM EDT To: Tera Helper, Judi Cong, RN, #  1. Obstructive Sleep Apnea controlled under 14 cm water pressure CPAP with a nasal mask, but oral venting continued.  2. Few Central Sleep Apneas emerged.  3. No Sleep Related Hypoxia was noted.  Mild snoring only. 4. Sleep fragmentation was evidently not related to apnea but promoted by oral venting of air.    PLANS/RECOMMENDATIONS:  1. CPAP therapy compliance is defined as 4 hours or more of nightly use. 2. Any apnea patient should avoid sedatives, hypnotics, and alcohol consumption at bedtime.   DISCUSSION:  Follow-up with referring provider, using auto CPAP at 7-16 cm water with EPR of 3 chosen interface and chin strap. The patient was fitted with a Medium N30 I nasal cushion mask. If a new CPAP machine is due, please write for autotitration device with 7-16 cm water setting, EPR of 3 cm water.

## 2020-10-29 ENCOUNTER — Other Ambulatory Visit: Payer: Self-pay | Admitting: Physician Assistant

## 2020-11-03 ENCOUNTER — Other Ambulatory Visit: Payer: Self-pay | Admitting: *Deleted

## 2020-11-03 MED ORDER — TRELEGY ELLIPTA 100-62.5-25 MCG/INH IN AEPB: 1.0000 | INHALATION_SPRAY | Freq: Every day | RESPIRATORY_TRACT | 3 refills | Status: DC

## 2020-11-11 ENCOUNTER — Ambulatory Visit: Payer: Self-pay | Admitting: Family Medicine

## 2020-11-18 ENCOUNTER — Telehealth: Payer: Self-pay | Admitting: Cardiology

## 2020-11-18 NOTE — Telephone Encounter (Signed)
I am not aware of any other pulmonary groups here in [place]. We could refer either to [facility] or [facility]. I don't know anyone there personally by happy to place referral anywhere he wants to go.   [place] MD, [facility]

## 2020-11-18 NOTE — Telephone Encounter (Signed)
Returned call to patient and patients wife who states that they would like a different referral to another pulmonary practice outside of [facility]. Patient currently sees Dr. Francine Graven. They would like to know if Dr. Swaziland had any recommendations or was able to provide a different referral. Advised I would forward message to Dr. Swaziland. Patient and patients wife verbalized understanding.

## 2020-11-18 NOTE — Telephone Encounter (Signed)
Spoke to patient's wife Dr.Jordan's advice given.Stated she will ask to switch to a different pulmonologist.

## 2020-11-18 NOTE — Telephone Encounter (Signed)
Patient's wife requesting another lung doctor referral be sent for the patient for a second opinion.

## 2020-11-28 ENCOUNTER — Telehealth: Payer: Self-pay | Admitting: Family Medicine

## 2020-11-28 NOTE — Telephone Encounter (Signed)
Please let him know that his apneic events are significantly better with new pressure settings. His daily compliance is perfect. Four hour is better!!! Is he doing ok with new pressure settings? Is shortness of breath better? We will continue daily usage for at least 4 hours and will follow up as planned. TY!

## 2020-11-28 NOTE — Telephone Encounter (Signed)
Called the patient to advise that family had reviewed the data with the new pressure settings on his CPAP.  Advised that his apnea events appear to be much better under the new settings.  His compliance looks better.  I asked the patient if his shortness of breath has gotten better under this new setting, and he states it is better though he does still have moments where he may have some shortness of breath.  He feels that if he starts to eat better and loses some weight that may help.  He did not feel any changes needed to be made and he will continue to use the machine and continue the current treatment plan.  Advised the patient to call if he had any problems.  Patient verbalized understanding.

## 2020-12-11 ENCOUNTER — Ambulatory Visit (INDEPENDENT_AMBULATORY_CARE_PROVIDER_SITE_OTHER): Payer: 59

## 2020-12-11 ENCOUNTER — Other Ambulatory Visit: Payer: Self-pay

## 2020-12-11 DIAGNOSIS — Z87891 Personal history of nicotine dependence: Secondary | ICD-10-CM | POA: Diagnosis not present

## 2020-12-15 ENCOUNTER — Other Ambulatory Visit: Payer: Self-pay | Admitting: Cardiology

## 2020-12-18 ENCOUNTER — Other Ambulatory Visit: Payer: Self-pay | Admitting: Acute Care

## 2020-12-18 DIAGNOSIS — Z87891 Personal history of nicotine dependence: Secondary | ICD-10-CM

## 2020-12-23 ENCOUNTER — Ambulatory Visit: Payer: 59 | Admitting: Pulmonary Disease

## 2020-12-23 ENCOUNTER — Other Ambulatory Visit: Payer: Self-pay

## 2020-12-23 ENCOUNTER — Encounter: Payer: Self-pay | Admitting: Pulmonary Disease

## 2020-12-23 VITALS — BP 128/82 | HR 86 | Temp 98.2°F | Ht 66.0 in | Wt 199.2 lb

## 2020-12-23 DIAGNOSIS — J438 Other emphysema: Secondary | ICD-10-CM

## 2020-12-23 DIAGNOSIS — G4731 Primary central sleep apnea: Secondary | ICD-10-CM | POA: Diagnosis not present

## 2020-12-23 NOTE — Patient Instructions (Addendum)
Continue trelegy ellipta daily - rinse mouth out after each use  Use albuterol inhaler as needed for shortness of breath, 1-2 puffs every 4-6 hours  We will refer you to pulmonary rehab, the wait list can be over 2 months  Your CT lung cancer screening CT shows no new concerning lesions or nodules, will resume CT follow up in 1 year

## 2020-12-23 NOTE — Progress Notes (Signed)
Synopsis: Referred in 10/2019 for dyspnea  Subjective:   PATIENT ID: Jason Munoz GENDER: male DOB: 01/07/1956, MRN: [id number]  HPI  Chief Complaint  Patient presents with   Follow-up    Still some SOB   Jason Munoz is a 65 year old male, former smoker with obstructive sleep apnea on CPAP, GERD, coronary artery disease and emphysema who returns to pulmonary clinic for follow up.   He continues to remain very dyspneic with exertion. He is using trelegy ellipta 1 puff daily. He is not using his albuterol inhaler as he does not remember to use it. He denies significant cough or sputum production.   He had follow up CPAP titration study via [facility] showed optimal PAP set to 14cmH2O with nasal pillow mask. He has been prescribed an auto-titrating machine at 7-16cmH20 with nasal pillow mask.    Past Medical History:  Diagnosis Date   AAA (abdominal aortic aneurysm) 09/2018   3.4cm   Allergy    Anxiety    Arthritis    Colitis    Coronary artery disease 12/06   stent LAD in 2006   COVID-19 01/2020   GERD (gastroesophageal reflux disease)    HTN (hypertension)    Hypercholesteremia    Myocardial infarction (HCC)    Sleep apnea    uses cpap   Snoring    Substance abuse (HCC)    Umbilical hernia    Ventral hernia      Family History  Problem Relation Age of Onset   COPD Father    Stomach cancer Neg Hx    Rectal cancer Neg Hx    Esophageal cancer Neg Hx    Colon cancer Neg Hx    Colon polyps Neg Hx      Social History   Socioeconomic History   Marital status: Married    Spouse name: Not on file   Number of children: 3   Years of education: HS   Highest education level: Not on file  Occupational History   Occupation: retired     Associate Professor: [employer]  Tobacco Use   Smoking status: Former    Packs/day: 1.50    Years: 33.00    Pack years: 49.50    Types: Cigarettes    Quit date: 06/04/2019    Years since quitting: 1.5    Smokeless tobacco: Former  Building services engineer Use: Former  Substance and Sexual Activity   Alcohol use: Never   Drug use: No   Sexual activity: Yes    Birth control/protection: None  Other Topics Concern   Not on file  Social History Narrative   Lives at home with his wife.   Right-handed.   No caffeine use.   Social Determinants of Health   Financial Resource Strain: Not on file  Food Insecurity: Not on file  Transportation Needs: Not on file  Physical Activity: Not on file  Stress: Not on file  Social Connections: Not on file  Intimate Partner Violence: Not on file     No Known Allergies   Outpatient Medications Prior to Visit  Medication Sig Dispense Refill   albuterol (VENTOLIN HFA) 108 (90 Base) MCG/ACT inhaler Inhale 1-2 puffs into the lungs every 4 (four) hours as needed for wheezing or shortness of breath. 6.7 each 6   aspirin 81 MG chewable tablet Chew 81 mg by mouth daily.      atorvastatin (LIPITOR) 40 MG tablet Take 1 tablet (40 mg total) by  mouth daily. 90 tablet 3   busPIRone (BUSPAR) 10 MG tablet Take 1 tablet (10 mg total) by mouth at bedtime. 90 tablet 3   celecoxib (CELEBREX) 200 MG capsule Take 200 mg by mouth daily.     clopidogrel (PLAVIX) 75 MG tablet TAKE 1 TABLET BY MOUTH EVERY DAY 90 tablet 3   escitalopram (LEXAPRO) 10 MG tablet Take 10 mg by mouth daily.      esomeprazole (NEXIUM) 40 MG capsule Take 1 capsule (40 mg total) by mouth daily at 12 noon.     Fluticasone-Umeclidin-Vilant (TRELEGY ELLIPTA) 100-62.5-25 MCG/INH AEPB Inhale 1 puff into the lungs daily. 3 each 3   ipratropium (ATROVENT) 0.06 % nasal spray 1-2 sprays in each nostril     levocetirizine (XYZAL) 5 MG tablet Take 5 mg by mouth daily.     metoprolol succinate (TOPROL-XL) 25 MG 24 hr tablet Take 25 mg by mouth every evening.     Multiple Vitamin (MULTIVITAMIN WITH MINERALS) TABS tablet Take 1 tablet by mouth daily with lunch.     nitroGLYCERIN (NITROSTAT) 0.4 MG SL tablet PLACE 1  TABLET (0.4 MG TOTAL) UNDER THE TONGUE EVERY 5 (FIVE) MINUTES AS NEEDED FOR CHEST PAIN (UP TO 3 DOSES. IF CHEST PAIN PERSISTENT AFTER THE 3RD DOSE, CALL 911). 25 tablet 3   pantoprazole (PROTONIX) 40 MG tablet Take 40 mg by mouth 2 (two) times daily.     PRALUENT 150 MG/ML SOAJ INJECT 150 MG INTO THE SKIN EVERY 14 (FOURTEEN) DAYS. 6 mL 3   sildenafil (VIAGRA) 100 MG tablet one tablet PO 1 hour prior to intercourse     tamsulosin (FLOMAX) 0.4 MG CAPS capsule Take 0.4 mg by mouth every evening.      valACYclovir (VALTREX) 500 MG tablet Take 500 mg by mouth daily.      umeclidinium-vilanterol (ANORO ELLIPTA) 62.5-25 MCG/INH AEPB Inhale 1 puff into the lungs daily. 60 each 6   No facility-administered medications prior to visit.    Review of Systems  Constitutional:  Negative for chills, diaphoresis, fever, malaise/fatigue and weight loss.  HENT:  Negative for congestion, sinus pain and sore throat.   Eyes: Negative.   Respiratory:  Positive for shortness of breath. Negative for cough, hemoptysis, sputum production and wheezing.   Cardiovascular:  Negative for chest pain, palpitations, orthopnea, claudication, leg swelling and PND.  Gastrointestinal:  Negative for abdominal pain, heartburn, nausea and vomiting.  Genitourinary: Negative.   Musculoskeletal: Negative.   Skin:  Negative for rash.  Neurological: Negative.   Endo/Heme/Allergies: Negative.   Psychiatric/Behavioral: Negative.     Objective:   Vitals:   12/23/20 1209  BP: 128/82  Pulse: 86  Temp: 98.2 F (36.8 C)  TempSrc: Oral  SpO2: 97%  Weight: 199 lb 3.2 oz (90.4 kg)  Height: 5\' 6"  (1.676 m)     Physical Exam Constitutional:      General: He is not in acute distress.    Appearance: He is obese. He is not ill-appearing.  HENT:     Head: Normocephalic and atraumatic.  Eyes:     General: No scleral icterus.    Conjunctiva/sclera: Conjunctivae normal.     Pupils: Pupils are equal, round, and reactive to light.   Cardiovascular:     Rate and Rhythm: Normal rate and regular rhythm.     Pulses: Normal pulses.     Heart sounds: Normal heart sounds. No murmur heard. Pulmonary:     Effort: Pulmonary effort is normal.  Breath sounds: Normal breath sounds. Decreased air movement present.  Musculoskeletal:     Right lower leg: No edema.     Left lower leg: No edema.  Skin:    General: Skin is warm and dry.  Neurological:     General: No focal deficit present.     Mental Status: He is alert.    CBC    Component Value Date/Time   WBC 7.6 09/07/2019 0418   RBC 4.50 09/07/2019 0418   HGB 13.4 09/07/2019 0418   HGB 16.8 08/28/2019 1123   HCT 40.5 09/07/2019 0418   HCT 50.9 08/28/2019 1123   PLT 205 09/07/2019 0418   PLT 213 08/28/2019 1123   MCV 90.0 09/07/2019 0418   MCV 89 08/28/2019 1123   MCH 29.8 09/07/2019 0418   MCHC 33.1 09/07/2019 0418   RDW 15.7 (H) 09/07/2019 0418   RDW 15.9 (H) 08/28/2019 1123   LYMPHSABS 1.1 09/05/2018 1016   MONOABS 0.6 09/05/2018 1016   EOSABS 0.1 09/05/2018 1016   BASOSABS 0.1 09/05/2018 1016    BMP Latest Ref Rng & Units 09/07/2019 09/06/2019 09/01/2019  Glucose 70 - 99 mg/dL 110(H) 108(H) -  BUN 8 - 23 mg/dL 19 20 -  Creatinine 0.61 - 1.24 mg/dL 1.44(H) 1.50(H) -  BUN/Creat Ratio 10 - 24 - - -  Sodium 135 - 145 mmol/L 139 140 143  Potassium 3.5 - 5.1 mmol/L 4.3 4.8 4.2  Chloride 98 - 111 mmol/L 108 103 -  CO2 22 - 32 mmol/L 23 26 -  Calcium 8.9 - 10.3 mg/dL 9.0 10.0 -   Chest imaging: Low Dose CT Chest 12/11/20 Lung rads 2. Right subpleural ground glass changes are stable  Low Dose CT Chest 12/06/19 Mediastinum/Nodes: No enlarged mediastinal, hilar, or axillary lymph nodes. Thyroid gland, trachea, and esophagus demonstrate no significant findings.   Lungs/Pleura: No pleural effusion. Paraseptal and centrilobular emphysema. No airspace consolidation, atelectasis or pneumothorax. Nodule within the anterior basal right upper lobe has an  equivalent diameter of 4.9 mm. Calcified granuloma is noted within the medial aspect of the left lung apex.  Lung-RADS 2, benign appearance or behavior.  PFT: PFT Results Latest Ref Rng & Units 12/01/2019  FVC-Pre L 4.07  FVC-Predicted Pre % 102  FVC-Post L 4.05  FVC-Predicted Post % 102  Pre FEV1/FVC % % 77  Post FEV1/FCV % % 80  FEV1-Pre L 3.14  FEV1-Predicted Pre % 105  FEV1-Post L 3.22  DLCO uncorrected ml/min/mmHg 18.65  DLCO UNC% % 78  DLCO corrected ml/min/mmHg 18.65  DLCO COR %Predicted % 78  DLVA Predicted % 67  TLC L 6.65  TLC % Predicted % 109  RV % Predicted % 121   Echo: 08/24/19 EF 60-65%, normal LV function with grade I diastolic dysfunction. RV systolic function and size are normal. No valvular dysfunction   Heart Catheterization: 09/06/19 Prox LAD lesion is 35% stenosed. Mid LAD lesion is 60% stenosed. 1st Mrg lesion is 40% stenosed. Prox RCA to Mid RCA lesion is 100% stenosed. Post intervention, there is a 0% residual stenosis. A drug-eluting stent was successfully placed using a SYNERGY XD [id number].   1. Successful CTO PCI of the mid RCA with DES x 1 and IVUS guidance Assessment & Plan:   Paraseptal emphysema ([place]) - Plan: AMB referral to pulmonary rehabilitation  Complex sleep apnea syndrome  Discussion: Jason Munoz is a 65 year old male, former smoker with obstructive sleep apnea on CPAP, GERD, coronary artery disease and emphysema who  returns to pulmonary clinic for follow up.   He is to continue trelegy ellipta 1 puff daily for his emphysema. We will refer him to pulmonary rehab for improvement in his exertional dyspnea. He is to use albuterol inhaler as needed.   CT lung cancer screening is lung rads 2. Will have another scan in 1 year.   His sleep apnea is managed via [facility].   Follow up in 9 months.  Freda Jackson, MD [facility] Office: [phone]    Current Outpatient Medications:     albuterol (VENTOLIN HFA) 108 (90 Base) MCG/ACT inhaler, Inhale 1-2 puffs into the lungs every 4 (four) hours as needed for wheezing or shortness of breath., Disp: 6.7 each, Rfl: 6   aspirin 81 MG chewable tablet, Chew 81 mg by mouth daily. , Disp: , Rfl:    atorvastatin (LIPITOR) 40 MG tablet, Take 1 tablet (40 mg total) by mouth daily., Disp: 90 tablet, Rfl: 3   busPIRone (BUSPAR) 10 MG tablet, Take 1 tablet (10 mg total) by mouth at bedtime., Disp: 90 tablet, Rfl: 3   celecoxib (CELEBREX) 200 MG capsule, Take 200 mg by mouth daily., Disp: , Rfl:    clopidogrel (PLAVIX) 75 MG tablet, TAKE 1 TABLET BY MOUTH EVERY DAY, Disp: 90 tablet, Rfl: 3   escitalopram (LEXAPRO) 10 MG tablet, Take 10 mg by mouth daily. , Disp: , Rfl:    esomeprazole (NEXIUM) 40 MG capsule, Take 1 capsule (40 mg total) by mouth daily at 12 noon., Disp: , Rfl:    Fluticasone-Umeclidin-Vilant (TRELEGY ELLIPTA) 100-62.5-25 MCG/INH AEPB, Inhale 1 puff into the lungs daily., Disp: 3 each, Rfl: 3   ipratropium (ATROVENT) 0.06 % nasal spray, 1-2 sprays in each nostril, Disp: , Rfl:    levocetirizine (XYZAL) 5 MG tablet, Take 5 mg by mouth daily., Disp: , Rfl:    metoprolol succinate (TOPROL-XL) 25 MG 24 hr tablet, Take 25 mg by mouth every evening., Disp: , Rfl:    Multiple Vitamin (MULTIVITAMIN WITH MINERALS) TABS tablet, Take 1 tablet by mouth daily with lunch., Disp: , Rfl:    nitroGLYCERIN (NITROSTAT) 0.4 MG SL tablet, PLACE 1 TABLET (0.4 MG TOTAL) UNDER THE TONGUE EVERY 5 (FIVE) MINUTES AS NEEDED FOR CHEST PAIN (UP TO 3 DOSES. IF CHEST PAIN PERSISTENT AFTER THE 3RD DOSE, CALL 911)., Disp: 25 tablet, Rfl: 3   pantoprazole (PROTONIX) 40 MG tablet, Take 40 mg by mouth 2 (two) times daily., Disp: , Rfl:    PRALUENT 150 MG/ML SOAJ, INJECT 150 MG INTO THE SKIN EVERY 14 (FOURTEEN) DAYS., Disp: 6 mL, Rfl: 3   sildenafil (VIAGRA) 100 MG tablet, one tablet PO 1 hour prior to intercourse, Disp: , Rfl:    tamsulosin (FLOMAX) 0.4 MG CAPS  capsule, Take 0.4 mg by mouth every evening. , Disp: , Rfl:    valACYclovir (VALTREX) 500 MG tablet, Take 500 mg by mouth daily. , Disp: , Rfl:

## 2021-01-16 ENCOUNTER — Telehealth (HOSPITAL_COMMUNITY): Payer: Self-pay

## 2021-01-16 NOTE — Telephone Encounter (Signed)
Pt called and stated he is interested in PR here at [facility]. Pt stated his address on file is incorrect but did recv'ed the letter we sent to contact PR to that address. Pt stated he will contact his PCP to have his address updated and he verified phone number on file is correct.

## 2021-01-21 ENCOUNTER — Encounter (HOSPITAL_COMMUNITY): Payer: Self-pay | Admitting: *Deleted

## 2021-01-21 NOTE — Progress Notes (Signed)
Received referral from Dr. Francine Graven for this pt to participate in pulmonary rehab with the diagnosis of Paraseptal Emphysema.  Noted per address in Epic for [place] address. Called and left message on 12/27/20 for pt for his preference in location here at [facility] or [facility].  Received no response from pt therefore a letter was sent on 11/21 with [facility] rehab brochure and information regarding [facility] Pulmonary rehab program.  Received phone call from pt on 12/1 he would like to come to [facility] and asked for his address to be updated. Clinical review of pt follow up appt on 11/7 Pulmonary office note.  Pt with Covid Risk Score - 5. Pt appropriate for scheduling for Pulmonary rehab.  Will forward to support staff for scheduling and verification of insurance eligibility/benefits with pt consent. Alanson Aly, BSN Cardiac and Emergency planning/management officer

## 2021-03-26 ENCOUNTER — Other Ambulatory Visit: Payer: Self-pay

## 2021-03-26 DIAGNOSIS — E7800 Pure hypercholesterolemia, unspecified: Secondary | ICD-10-CM

## 2021-03-31 ENCOUNTER — Telehealth (HOSPITAL_COMMUNITY): Payer: Self-pay

## 2021-03-31 NOTE — Telephone Encounter (Signed)
Pt insurance is active and benefits verified through [facility] Co-pay $17, DED 0/0 met, out of pocket $500/$17 met, co-insurance 0%. no pre-authorization required. [place]/[employer] 03/31/2021'@10' :51am, REF# [id number]

## 2021-03-31 NOTE — Telephone Encounter (Signed)
Called patient to see if he was interested in participating in the Pulmonary Rehab Program. Patient stated yes. Patient will come in for orientation on 04/23/2021@11 :00am and will attend the 1:15pm exercise class.   Pensions consultant.

## 2021-04-04 ENCOUNTER — Telehealth: Payer: Self-pay | Admitting: Cardiology

## 2021-04-04 NOTE — Telephone Encounter (Signed)
Spoke with patient who reports getting lab work this week. He will bring the results next week so we can make copies for Dr. Martinique.

## 2021-04-04 NOTE — Telephone Encounter (Signed)
Patient states he was seen for a yearly physical with his PCP Dr. Reynaldo Minium at [facility]. He says he has lab work there and his cholesterol was perfect. He states he received a letter telling him to get lab work for Dr. Martinique, but wants to know if he still needs to get it since he just had labs drawn.

## 2021-04-07 LAB — LIPID PANEL
Chol/HDL Ratio: 1.5 ratio (ref 0.0–5.0)
Cholesterol, Total: 79 mg/dL — ABNORMAL LOW (ref 100–199)
HDL: 51 mg/dL (ref >39)
LDL Chol Calc (NIH): 8 mg/dL (ref 0–99)
Triglycerides: 112 mg/dL (ref 0–149)
VLDL Cholesterol Cal: 20 mg/dL (ref 5–40)

## 2021-04-07 LAB — HEPATIC FUNCTION PANEL
ALT: 17 IU/L (ref 0–44)
AST: 22 IU/L (ref 0–40)
Albumin: 4.4 g/dL (ref 3.8–4.8)
Alkaline Phosphatase: 98 IU/L (ref 44–121)
Bilirubin Total: 0.8 mg/dL (ref 0.0–1.2)
Bilirubin, Direct: 0.27 mg/dL (ref 0.00–0.40)
Total Protein: 6.7 g/dL (ref 6.0–8.5)

## 2021-04-15 ENCOUNTER — Telehealth: Payer: Self-pay | Admitting: Cardiology

## 2021-04-15 ENCOUNTER — Other Ambulatory Visit: Payer: Self-pay | Admitting: Cardiology

## 2021-04-15 MED ORDER — PRALUENT 150 MG/ML ~~LOC~~ SOAJ: 150.0000 mg | SUBCUTANEOUS | 0 refills | Status: DC

## 2021-04-15 NOTE — Telephone Encounter (Signed)
°*  STAT* If patient is at the pharmacy, call can be transferred to refill team.   1. Which medications need to be refilled? (please list name of each medication and dose if known)  PRALUENT 150 MG/ML SOAJ  2. Which pharmacy/location (including street and city if local pharmacy) is medication to be sent to? [id number] [address], [place] - [address]   3. Do they need a 30 day or 90 day supply?  90 day supply  Patient states he is completely out of medication.

## 2021-04-15 NOTE — Telephone Encounter (Signed)
Pt c/o medication issue:  1. Name of Medication: PRALUENT 150 MG/ML SOAJ   2. How are you currently taking this medication (dosage and times per day)? INJECT 150 MG INTO THE SKIN EVERY 14 (FOURTEEN) DAYS.  3. Are you having a reaction (difficulty breathing--STAT)? no  4. What is your medication issue? [facility] that [facility] in order to dispense... please advise

## 2021-04-15 NOTE — Telephone Encounter (Signed)
Called and told the pt the following information and they voiced understanding.  Pa sent and was approved: Jason Munoz Key: Dennie Bible

## 2021-04-15 NOTE — Telephone Encounter (Signed)
Refills has been sent to the pharmacy. 

## 2021-04-23 ENCOUNTER — Other Ambulatory Visit: Payer: Self-pay

## 2021-04-23 ENCOUNTER — Encounter (HOSPITAL_COMMUNITY)
Admission: RE | Admit: 2021-04-23 | Discharge: 2021-04-23 | Disposition: A | Discharge status: Home / Self Care | Payer: 59 | Source: Ambulatory Visit | Attending: Pulmonary Disease | Admitting: Pulmonary Disease

## 2021-04-23 ENCOUNTER — Ambulatory Visit (HOSPITAL_COMMUNITY): Payer: 59

## 2021-04-23 ENCOUNTER — Encounter (HOSPITAL_COMMUNITY): Payer: Self-pay

## 2021-04-23 VITALS — BP 112/68 | HR 57 | Ht 66.0 in | Wt 199.5 lb

## 2021-04-23 DIAGNOSIS — J438 Other emphysema: Secondary | ICD-10-CM | POA: Insufficient documentation

## 2021-04-23 HISTORY — DX: Emphysema, unspecified: J43.9

## 2021-04-23 NOTE — Progress Notes (Signed)
Jason Munoz 66 y.o. male ?Pulmonary Rehab Orientation Note ?This patient who was referred to Pulmonary Rehab by Dr. Erin Fulling with the diagnosis of paraseptal emphysema arrived today in Cardiac and Pulmonary Rehab. He arrived  ambulatory with normal gait. He does not carry portable oxygen. Per pt, Arnol uses oxygen never. Color good, skin warm and dry. Patient is oriented to time and place. Patient's medical history, psychosocial health, and medications reviewed. Psychosocial assessment reveals pt lives with spouse. Herschal is currently retired. Pt hobbies include  playing golf, softball, and working in the yard . Pt reports his stress level is low.  Pt does not exhibit signs of depression. PHQ2/9 score 0/3. Tacorey shows good  coping skills with positive outlook on life. Jayshaun takes Lexapro to control his anger and depression, he feels he is stable on it.  He is a recovering alcoholic and has a son who is alcoholic which is a source of concern and worry.  Will continue to monitor and evaluate progress toward psychosocial goal(s) of continued psychosocial wellness while participating in pulmonary rehab. Physical assessment reveals heart rate is normal, breath sounds clear to auscultation, no wheezes, rales, or rhonchi. Grip strength equal, strong. Distal pulses present. Conall reports he  does take medications as prescribed. Patient states he  follows a regular  diet. The patient reports no specific efforts to gain or lose weight.. Pt's weight will be monitored closely. Demonstration and practice of PLB using pulse oximeter. Ranen able to return demonstration satisfactorily. Safety and hand hygiene in the exercise area reviewed with patient. Luigi voices understanding of the information reviewed. Department expectations discussed with patient and achievable goals were set. The patient shows enthusiasm about attending the program and we look forward to working with Remo Lipps. Yasser completed a 6 min walk test today  and is scheduled to begin exercise on 04/29/2021 at 1:15 pm.  ? ?0810--1010 ?Liliane Channel ?  ?

## 2021-04-23 NOTE — Progress Notes (Signed)
Pulmonary Individual Treatment Plan  Patient Details  Name: Jason Munoz MRN: [id number] Date of Birth: [birth year] Referring Provider:   Doristine Devoid Pulmonary Rehab Walk Test from 04/23/2021 in [facility] CARD[facility]  Referring Provider Dewald       Initial Encounter Date:  Flowsheet Row Pulmonary Rehab Walk Test from 04/23/2021 in [facility] CARDI[facility] REHAB  Date 04/23/21       Visit Diagnosis: Paraseptal emphysema (HCC)  Patient's Home Medications on Admission:   Current Outpatient Medications:    albuterol (VENTOLIN HFA) 108 (90 Base) MCG/ACT inhaler, Inhale 1-2 puffs into the lungs every 4 (four) hours as needed for wheezing or shortness of breath., Disp: 6.7 each, Rfl: 6   Alirocumab (PRALUENT) 150 MG/ML SOAJ, Inject 150 mg into the skin every 14 (fourteen) days. NEED OV., Disp: 6 mL, Rfl: 0   aspirin 81 MG chewable tablet, Chew 81 mg by mouth daily. , Disp: , Rfl:    atorvastatin (LIPITOR) 40 MG tablet, TAKE 1 TABLET BY MOUTH EVERY DAY, Disp: 90 tablet, Rfl: 3   busPIRone (BUSPAR) 10 MG tablet, Take 1 tablet (10 mg total) by mouth at bedtime., Disp: 90 tablet, Rfl: 3   celecoxib (CELEBREX) 200 MG capsule, Take 200 mg by mouth daily., Disp: , Rfl:    escitalopram (LEXAPRO) 10 MG tablet, Take 10 mg by mouth daily. , Disp: , Rfl:    esomeprazole (NEXIUM) 40 MG capsule, Take 1 capsule (40 mg total) by mouth daily at 12 noon., Disp: , Rfl:    Fluticasone-Umeclidin-Vilant (TRELEGY ELLIPTA) 100-62.5-25 MCG/INH AEPB, Inhale 1 puff into the lungs daily., Disp: 3 each, Rfl: 3   ipratropium (ATROVENT) 0.06 % nasal spray, 1-2 sprays in each nostril, Disp: , Rfl:    levocetirizine (XYZAL) 5 MG tablet, Take 5 mg by mouth daily., Disp: , Rfl:    metoprolol succinate (TOPROL-XL) 25 MG 24 hr tablet, Take 25 mg by mouth every evening., Disp: , Rfl:    Multiple Vitamin (MULTIVITAMIN WITH MINERALS) TABS tablet, Take 1 tablet by mouth daily with lunch., Disp:  , Rfl:    pantoprazole (PROTONIX) 40 MG tablet, Take 40 mg by mouth 2 (two) times daily., Disp: , Rfl:    tamsulosin (FLOMAX) 0.4 MG CAPS capsule, Take 0.4 mg by mouth every evening. , Disp: , Rfl:    valACYclovir (VALTREX) 500 MG tablet, Take 500 mg by mouth daily. , Disp: , Rfl:    clopidogrel (PLAVIX) 75 MG tablet, TAKE 1 TABLET BY MOUTH EVERY DAY (Patient not taking: Reported on 04/23/2021), Disp: 90 tablet, Rfl: 3   nitroGLYCERIN (NITROSTAT) 0.4 MG SL tablet, PLACE 1 TABLET (0.4 MG TOTAL) UNDER THE TONGUE EVERY 5 (FIVE) MINUTES AS NEEDED FOR CHEST PAIN (UP TO 3 DOSES. IF CHEST PAIN PERSISTENT AFTER THE 3RD DOSE, CALL 911). (Patient not taking: Reported on 04/23/2021), Disp: 25 tablet, Rfl: 3   sildenafil (VIAGRA) 100 MG tablet, one tablet PO 1 hour prior to intercourse (Patient not taking: Reported on 04/23/2021), Disp: , Rfl:   Past Medical History: Past Medical History:  Diagnosis Date   AAA (abdominal aortic aneurysm) 09/2018   3.4cm   Allergy    Anxiety    Arthritis    Colitis    Coronary artery disease 01/2005   stent LAD in 2006   COVID-19 01/2020   Emphysema lung (HCC)    GERD (gastroesophageal reflux disease)    HTN (hypertension)    Hypercholesteremia    Myocardial infarction (  HCC)    Sleep apnea    uses cpap   Snoring    Substance abuse (HCC)    Umbilical hernia    Ventral hernia     Tobacco Use: Social History   Tobacco Use  Smoking Status Former   Packs/day: 1.50   Years: 33.00   Pack years: 49.50   Types: Cigarettes   Quit date: 06/04/2019   Years since quitting: 1.8  Smokeless Tobacco Former    Labs: Recent Lobbyist for [facility] Cardiac and Pulmonary Rehab Latest Ref Rng & Units 09/01/2019 09/01/2019 11/06/2019 04/07/2021   Cholestrol 100 - 199 mg/dL - - 329 51(O)   LDLCALC 0 - 99 mg/dL - - 82 8   HDL >84 mg/dL - - 47 51   Trlycerides 0 - 149 mg/dL - - 166(A) 630   PHART 7.350 - 7.450 7.378 - - -   PCO2ART 32.0 - 48.0 mmHg 45.1 - - -    HCO3 20.0 - 28.0 mmol/L 26.6 27.4 - -   TCO2 22 - 32 mmol/L 28 29 - -   O2SAT % 98.0 72.0 - -       Capillary Blood Glucose: No results found for: GLUCAP   Pulmonary Assessment Scores:  Pulmonary Assessment Scores     Row Name 04/23/21 0936         ADL UCSD   ADL Phase Entry     SOB Score total 47       CAT Score   CAT Score 24             UCSD: Self-administered rating of dyspnea associated with activities of daily living (ADLs) 6-point scale (0 = "not at all" to 5 = "maximal or unable to do because of breathlessness")  Scoring Scores range from 0 to 120.  Minimally important difference is 5 units  CAT: CAT can identify the health impairment of COPD patients and is better correlated with disease progression.  CAT has a scoring range of zero to 40. The CAT score is classified into four groups of low (less than 10), medium (10 - 20), high (21-30) and very high (31-40) based on the impact level of disease on health status. A CAT score over 10 suggests significant symptoms.  A worsening CAT score could be explained by an exacerbation, poor medication adherence, poor inhaler technique, or progression of COPD or comorbid conditions.  CAT MCID is 2 points  mMRC: mMRC (Modified Medical Research Council) Dyspnea Scale is used to assess the degree of baseline functional disability in patients of respiratory disease due to dyspnea. No minimal important difference is established. A decrease in score of 1 point or greater is considered a positive change.   Pulmonary Function Assessment:  Pulmonary Function Assessment - 04/23/21 0935       Breath   Bilateral Breath Sounds Clear;Decreased    Shortness of Breath Yes;Limiting activity             Exercise Target Goals: Exercise Program Goal: Individual exercise prescription set using results from initial 6 min walk test and THRR while considering  patients activity barriers and safety.   Exercise Prescription  Goal: Initial exercise prescription builds to 30-45 minutes a day of aerobic activity, 2-3 days per week.  Home exercise guidelines will be given to patient during program as part of exercise prescription that the participant will acknowledge.  Activity Barriers & Risk Stratification:  Activity Barriers & Cardiac Risk Stratification -  04/23/21 4098       Activity Barriers & Cardiac Risk Stratification   Activity Barriers Back Problems;Arthritis;Deconditioning;Muscular Weakness;Shortness of Breath;History of Falls             6 Minute Walk:  6 Minute Walk     Row Name 04/23/21 1002         6 Minute Walk   Phase Initial     Distance 1600 feet     Walk Time 6 minutes     # of Rest Breaks 0     MPH 3.03     METS 3.4     RPE 12     Perceived Dyspnea  1     VO2 Peak 11.89     Symptoms No     Resting HR 58 bpm     Resting BP 112/68     Resting Oxygen Saturation  96 %     Exercise Oxygen Saturation  during 6 min walk 91 %     Max Ex. HR 89 bpm     Max Ex. BP 128/70     2 Minute Post BP 118/68       Interval HR   1 Minute HR 73     2 Minute HR 69     3 Minute HR 69     4 Minute HR 89     5 Minute HR 86     6 Minute HR 87     2 Minute Post HR 59     Interval Heart Rate? Yes       Interval Oxygen   Interval Oxygen? Yes     Baseline Oxygen Saturation % 96 %     1 Minute Oxygen Saturation % 95 %     1 Minute Liters of Oxygen 0 L     2 Minute Oxygen Saturation % 93 %     2 Minute Liters of Oxygen 0 L     3 Minute Oxygen Saturation % 91 %     3 Minute Liters of Oxygen 0 L     4 Minute Oxygen Saturation % 91 %     4 Minute Liters of Oxygen 0 L     5 Minute Oxygen Saturation % 93 %     5 Minute Liters of Oxygen 0 L     6 Minute Oxygen Saturation % 91 %     6 Minute Liters of Oxygen 0 L     2 Minute Post Oxygen Saturation % 99 %     2 Minute Post Liters of Oxygen 0 L              Oxygen Initial Assessment:  Oxygen Initial Assessment - 04/23/21 0935        Home Oxygen   Home Oxygen Device None    Sleep Oxygen Prescription CPAP    Liters per minute 0    Home Exercise Oxygen Prescription None    Home Resting Oxygen Prescription None    Compliance with Home Oxygen Use Yes      Initial 6 min Walk   Oxygen Used None      Program Oxygen Prescription   Program Oxygen Prescription None      Intervention   Short Term Goals To learn and exhibit compliance with exercise, home and travel O2 prescription;To learn and understand importance of monitoring SPO2 with pulse oximeter and demonstrate accurate use of the pulse oximeter.;To learn and understand importance of maintaining oxygen saturations>88%;To learn  and demonstrate proper pursed lip breathing techniques or other breathing techniques. ;To learn and demonstrate proper use of respiratory medications    Long  Term Goals Exhibits compliance with exercise, home  and travel O2 prescription;Verbalizes importance of monitoring SPO2 with pulse oximeter and return demonstration;Maintenance of O2 saturations>88%;Exhibits proper breathing techniques, such as pursed lip breathing or other method taught during program session;Compliance with respiratory medication;Demonstrates proper use of MDIs             Oxygen Re-Evaluation:   Oxygen Discharge (Final Oxygen Re-Evaluation):   Initial Exercise Prescription:  Initial Exercise Prescription - 04/23/21 1000       Date of Initial Exercise RX and Referring Provider   Date 04/23/21    Referring Provider Dewald    Expected Discharge Date 06/26/21      Recumbant Bike   Level 1    Watts 25    Minutes 15      NuStep   Level 1    SPM 70    Minutes 15      Prescription Details   Frequency (times per week) 2    Duration Progress to 30 minutes of continuous aerobic without signs/symptoms of physical distress      Intensity   THRR 40-80% of Max Heartrate 63-126    Ratings of Perceived Exertion 11-13    Perceived Dyspnea 0-4      Progression    Progression Continue to progress workloads to maintain intensity without signs/symptoms of physical distress.      Resistance Training   Training Prescription Yes    Weight blue bands    Reps 10-15             Perform Capillary Blood Glucose checks as needed.  Exercise Prescription Changes:   Exercise Comments:   Exercise Goals and Review:   Exercise Goals     Row Name 04/23/21 0907             Exercise Goals   Increase Physical Activity Yes       Intervention Provide advice, education, support and counseling about physical activity/exercise needs.;Develop an individualized exercise prescription for aerobic and resistive training based on initial evaluation findings, risk stratification, comorbidities and participant's personal goals.       Expected Outcomes Short Term: Attend rehab on a regular basis to increase amount of physical activity.;Long Term: Add in home exercise to make exercise part of routine and to increase amount of physical activity.;Long Term: Exercising regularly at least 3-5 days a week.       Increase Strength and Stamina Yes       Intervention Provide advice, education, support and counseling about physical activity/exercise needs.;Develop an individualized exercise prescription for aerobic and resistive training based on initial evaluation findings, risk stratification, comorbidities and participant's personal goals.       Expected Outcomes Short Term: Increase workloads from initial exercise prescription for resistance, speed, and METs.;Short Term: Perform resistance training exercises routinely during rehab and add in resistance training at home;Long Term: Improve cardiorespiratory fitness, muscular endurance and strength as measured by increased METs and functional capacity ( )       Able to understand and use rate of perceived exertion (RPE) scale Yes       Intervention Provide education and explanation on how to use RPE scale       Expected  Outcomes Short Term: Able to use RPE daily in rehab to express subjective intensity level;Long Term:  Able to use RPE to guide  intensity level when exercising independently       Able to understand and use Dyspnea scale Yes       Intervention Provide education and explanation on how to use Dyspnea scale       Expected Outcomes Short Term: Able to use Dyspnea scale daily in rehab to express subjective sense of shortness of breath during exertion;Long Term: Able to use Dyspnea scale to guide intensity level when exercising independently       Knowledge and understanding of Target Heart Rate Range (THRR) Yes       Intervention Provide education and explanation of THRR including how the numbers were predicted and where they are located for reference       Expected Outcomes Short Term: Able to state/look up THRR;Long Term: Able to use THRR to govern intensity when exercising independently;Short Term: Able to use daily as guideline for intensity in rehab       Understanding of Exercise Prescription Yes       Intervention Provide education, explanation, and written materials on patient's individual exercise prescription       Expected Outcomes Short Term: Able to explain program exercise prescription;Long Term: Able to explain home exercise prescription to exercise independently                Exercise Goals Re-Evaluation :   Discharge Exercise Prescription (Final Exercise Prescription Changes):   Nutrition:  Target Goals: Understanding of nutrition guidelines, daily intake of sodium 1500mg , cholesterol 200mg , calories 30% from fat and 7% or less from saturated fats, daily to have 5 or more servings of fruits and vegetables.  Biometrics:  Pre Biometrics - 04/23/21 0934       Pre Biometrics   Height 5\' 6"  (1.676 m)    Weight 90.5 kg    BMI (Calculated) 32.22    Grip Strength 38 kg              Nutrition Therapy Plan and Nutrition Goals:   Nutrition Assessments:  MEDIFICTS  Score Key: ?70 Need to make dietary changes  40-70 Heart Healthy Diet ? 40 Therapeutic Level Cholesterol Diet   Picture Your Plate Scores: <03 Unhealthy dietary pattern with much room for improvement. 41-50 Dietary pattern unlikely to meet recommendations for good health and room for improvement. 51-60 More healthful dietary pattern, with some room for improvement.  >60 Healthy dietary pattern, although there may be some specific behaviors that could be improved.    Nutrition Goals Re-Evaluation:   Nutrition Goals Discharge (Final Nutrition Goals Re-Evaluation):   Psychosocial: Target Goals: Acknowledge presence or absence of significant depression and/or stress, maximize coping skills, provide positive support system. Participant is able to verbalize types and ability to use techniques and skills needed for reducing stress and depression.  Initial Review & Psychosocial Screening:  Initial Psych Review & Screening - 04/23/21 0939       Initial Review   Current issues with History of Depression;Current Depression      Family Dynamics   Good Support System? Yes   wife and 2 sons   Comments Has a history of depression,  current treatment is Lexapro to control anger and depression.      Barriers   Psychosocial barriers to participate in program There are no identifiable barriers or psychosocial needs.      Screening Interventions   Interventions Encouraged to exercise    Expected Outcomes Long Term Goal: Stressors or current issues are controlled or eliminated.  Quality of Life Scores:  Scores of 19 and below usually indicate a poorer quality of life in these areas.  A difference of  2-3 points is a clinically meaningful difference.  A difference of 2-3 points in the total score of the Quality of Life Index has been associated with significant improvement in overall quality of life, self-image, physical symptoms, and general health in studies assessing change in  quality of life.  PHQ-9: Recent Review Flowsheet Data     Depression screen [facility] 2/9 04/23/2021 04/23/2021   Decreased Interest 0 0   Down, Depressed, Hopeless 0 0   PHQ - 2 Score 0 0   Altered sleeping 0 -   Tired, decreased energy 3 -   Change in appetite 0 -   Feeling bad or failure about yourself  0 -   Trouble concentrating 0 -   Moving slowly or fidgety/restless 0 -   Suicidal thoughts 0 -   PHQ-9 Score 3 -   Difficult doing work/chores Not difficult at all -      Interpretation of Total Score  Total Score Depression Severity:  1-4 = Minimal depression, 5-9 = Mild depression, 10-14 = Moderate depression, 15-19 = Moderately severe depression, 20-27 = Severe depression   Psychosocial Evaluation and Intervention:  Psychosocial Evaluation - 04/23/21 0941       Psychosocial Evaluation & Interventions   Interventions Encouraged to exercise with the program and follow exercise prescription    Comments Brett Canales feels lexapro controls his anger and depression. No psychosocial barriers or concerns identified today.    Expected Outcomes For Brett Canales to continue to have no psychosocial concerns while participating in pulmonary rehab.    Continue Psychosocial Services  No Follow up required             Psychosocial Re-Evaluation:  Psychosocial Re-Evaluation     Row Name 04/23/21 [id number]             Psychosocial Re-Evaluation   Current issues with Current Depression;History of Depression                Psychosocial Discharge (Final Psychosocial Re-Evaluation):  Psychosocial Re-Evaluation - 04/23/21 9604       Psychosocial Re-Evaluation   Current issues with Current Depression;History of Depression             Education: Education Goals: Education classes will be provided on a weekly basis, covering required topics. Participant will state understanding/return demonstration of topics presented.  Learning Barriers/Preferences:  Learning Barriers/Preferences -  04/23/21 5409       Learning Barriers/Preferences   Learning Barriers None    Learning Preferences Audio;Computer/Internet;Group Instruction;Individual Instruction;Pictoral;Skilled Demonstration;Verbal Instruction;Video;Written Material             Education Topics: Risk Factor Reduction:  -Group instruction that is supported by a PowerPoint presentation. Instructor discusses the definition of a risk factor, different risk factors for pulmonary disease, and how the heart and lungs work together.     Nutrition for Pulmonary Patient:  -Group instruction provided by PowerPoint slides, verbal discussion, and written materials to support subject matter. The instructor gives an explanation and review of healthy diet recommendations, which includes a discussion on weight management, recommendations for fruit and vegetable consumption, as well as protein, fluid, caffeine, fiber, sodium, sugar, and alcohol. Tips for eating when patients are short of breath are discussed.   Pursed Lip Breathing:  -Group instruction that is supported by demonstration and informational handouts. Instructor discusses the benefits of pursed lip  and diaphragmatic breathing and detailed demonstration on how to preform both.     Oxygen Safety:  -Group instruction provided by PowerPoint, verbal discussion, and written material to support subject matter. There is an overview of What is Oxygen and Why do we need it.  Instructor also reviews how to create a safe environment for oxygen use, the importance of using oxygen as prescribed, and the risks of noncompliance. There is a brief discussion on traveling with oxygen and resources the patient may utilize.   Oxygen Equipment:  -Group instruction provided by [facility] Staff utilizing handouts, written materials, and equipment demonstrations.   Signs and Symptoms:  -Group instruction provided by written material and verbal discussion to support subject matter.  Warning signs and symptoms of infection, stroke, and heart attack are reviewed and when to call the physician/911 reinforced. Tips for preventing the spread of infection discussed.   Advanced Directives:  -Group instruction provided by verbal instruction and written material to support subject matter. Instructor reviews Advanced Directive laws and proper instruction for filling out document.   Pulmonary Video:  -Group video education that reviews the importance of medication and oxygen compliance, exercise, good nutrition, pulmonary hygiene, and pursed lip and diaphragmatic breathing for the pulmonary patient.   Exercise for the Pulmonary Patient:  -Group instruction that is supported by a PowerPoint presentation. Instructor discusses benefits of exercise, core components of exercise, frequency, duration, and intensity of an exercise routine, importance of utilizing pulse oximetry during exercise, safety while exercising, and options of places to exercise outside of rehab.     Pulmonary Medications:  -Verbally interactive group education provided by instructor with focus on inhaled medications and proper administration.   Anatomy and Physiology of the Respiratory System and Intimacy:  -Group instruction provided by PowerPoint, verbal discussion, and written material to support subject matter. Instructor reviews respiratory cycle and anatomical components of the respiratory system and their functions. Instructor also reviews differences in obstructive and restrictive respiratory diseases with examples of each. Intimacy, Sex, and Sexuality differences are reviewed with a discussion on how relationships can change when diagnosed with pulmonary disease. Common sexual concerns are reviewed.   MD DAY -A group question and answer session with a medical doctor that allows participants to ask questions that relate to their pulmonary disease state.   OTHER EDUCATION -Group or individual verbal,  written, or video instructions that support the educational goals of the pulmonary rehab program.   Holiday Eating Survival Tips:  -Group instruction provided by PowerPoint slides, verbal discussion, and written materials to support subject matter. The instructor gives patients tips, tricks, and techniques to help them not only survive but enjoy the holidays despite the onslaught of food that accompanies the holidays.   Knowledge Questionnaire Score:  Knowledge Questionnaire Score - 04/23/21 0938       Knowledge Questionnaire Score   Pre Score 14/18             Core Components/Risk Factors/Patient Goals at Admission:  Personal Goals and Risk Factors at Admission - 04/23/21 0943       Core Components/Risk Factors/Patient Goals on Admission   Improve shortness of breath with ADL's Yes    Intervention Provide education, individualized exercise plan and daily activity instruction to help decrease symptoms of SOB with activities of daily living.    Expected Outcomes Short Term: Improve cardiorespiratory fitness to achieve a reduction of symptoms when performing ADLs;Long Term: Be able to perform more ADLs without symptoms or delay the onset of  symptoms    Increase knowledge of respiratory medications and ability to use respiratory devices properly  Yes    Intervention Provide education and demonstration as needed of appropriate use of medications, inhalers, and oxygen therapy.    Expected Outcomes Short Term: Achieves understanding of medications use. Understands that oxygen is a medication prescribed by physician. Demonstrates appropriate use of inhaler and oxygen therapy.;Long Term: Maintain appropriate use of medications, inhalers, and oxygen therapy.             Core Components/Risk Factors/Patient Goals Review:   Goals and Risk Factor Review     Row Name 04/23/21 0943             Core Components/Risk Factors/Patient Goals Review   Personal Goals Review Develop more  efficient breathing techniques such as purse lipped breathing and diaphragmatic breathing and practicing self-pacing with activity.;Increase knowledge of respiratory medications and ability to use respiratory devices properly.;Improve shortness of breath with ADL's                Core Components/Risk Factors/Patient Goals at Discharge (Final Review):   Goals and Risk Factor Review - 04/23/21 0943       Core Components/Risk Factors/Patient Goals Review   Personal Goals Review Develop more efficient breathing techniques such as purse lipped breathing and diaphragmatic breathing and practicing self-pacing with activity.;Increase knowledge of respiratory medications and ability to use respiratory devices properly.;Improve shortness of breath with ADL's             ITP Comments:   Comments: Dr. Mechele CollinJane Ellison is [facility] Director [facility].

## 2021-04-29 ENCOUNTER — Encounter (HOSPITAL_COMMUNITY)
Admission: RE | Admit: 2021-04-29 | Discharge: 2021-04-29 | Disposition: A | Discharge status: Home / Self Care | Payer: 59 | Source: Ambulatory Visit | Attending: Pulmonary Disease | Admitting: Pulmonary Disease

## 2021-04-29 ENCOUNTER — Other Ambulatory Visit: Payer: Self-pay

## 2021-04-29 DIAGNOSIS — J438 Other emphysema: Secondary | ICD-10-CM | POA: Diagnosis not present

## 2021-04-29 NOTE — Progress Notes (Signed)
Daily Session Note ? ?Patient Details  ?Name: Jason Munoz ?MRN: [id number] ?Date of Birth: 1955/06/20 ?Referring Provider:   ?Flowsheet Row Pulmonary Rehab Walk Test from 04/23/2021 in [place]  ?Referring Provider Dewald  ? ?  ? ? ?Encounter Date: 04/29/2021 ? ?Check In: ? Session Check In - 04/29/21 1506   ? ?  ? Check-In  ? Supervising physician immediately available to respond to emergencies [facility] MD immediately available   ? Physician(s) Alfredia Ferguson   ? Location [facility]   ? Staff Present Elmon Else, MS, ACSM-CEP, Exercise Physiologist;Lisa Ysidro Evert, RN;Other   ? Virtual Visit No   ? Medication changes reported     No   ? Fall or balance concerns reported    No   ? Tobacco Cessation No Change   ? Warm-up and Cool-down Performed as group-led instruction   ? Resistance Training Performed Yes   ? VAD Patient? No   ? PAD/SET Patient? No   ?  ? Pain Assessment  ? Currently in Pain? No/denies   ? Pain Score 0-No pain   ? ?  ?  ? ?  ? ? ?Capillary Blood Glucose: ?No results found for this or any previous visit (from the past 24 hour(s)). ? ? ? ?Social History  ? ?Tobacco Use  ?Smoking Status Former  ? Packs/day: 1.50  ? Years: 33.00  ? Pack years: 49.50  ? Types: Cigarettes  ? Quit date: 06/04/2019  ? Years since quitting: 1.9  ?Smokeless Tobacco Former  ? ? ?Goals Met:  ?Proper associated with RPD/PD & O2 Sat ?Exercise tolerated well ?No report of concerns or symptoms today ?Strength training completed today ? ?Goals Unmet:  ?Not Applicable ? ?Comments: Service time is from 1326 to 1440.  ? ? ?Dr. Rodman Pickle is Medical Director for Pulmonary Rehab at [facility].  ?

## 2021-05-01 ENCOUNTER — Encounter (HOSPITAL_COMMUNITY)
Admission: RE | Admit: 2021-05-01 | Discharge: 2021-05-01 | Disposition: A | Discharge status: Home / Self Care | Payer: 59 | Source: Ambulatory Visit | Attending: Pulmonary Disease | Admitting: Pulmonary Disease

## 2021-05-01 ENCOUNTER — Other Ambulatory Visit: Payer: Self-pay

## 2021-05-01 DIAGNOSIS — J438 Other emphysema: Secondary | ICD-10-CM | POA: Diagnosis not present

## 2021-05-01 NOTE — Progress Notes (Signed)
Daily Session Note ? ?Patient Details  ?Name: Jason Munoz ?MRN: [id number] ?Date of Birth: 25-May-1955 ?Referring Provider:   ?Flowsheet Row Pulmonary Rehab Walk Test from 04/23/2021 in [place]  ?Referring Provider Dewald  ? ?  ? ? ?Encounter Date: 05/01/2021 ? ?Check In: ? Session Check In - 05/01/21 1332   ? ?  ? Check-In  ? Supervising physician immediately available to respond to emergencies [facility] immediately available   ? Physician(s) Dr Cruzita Lederer   ? Location [facility]   ? Staff Present Rosebud Poles, RN, Milus Glazier, MS, ACSM-CEP, CCRP, Exercise Physiologist;Kaylee Rosana Hoes, MS, ACSM-CEP, Exercise Physiologist;Other   ? Virtual Visit No   ? Medication changes reported     No   ? Fall or balance concerns reported    No   ? Tobacco Cessation No Change   ? Warm-up and Cool-down Performed as group-led instruction   ? Resistance Training Performed Yes   ? VAD Patient? No   ? PAD/SET Patient? No   ?  ? Pain Assessment  ? Currently in Pain? No/denies   ? Pain Score 0-No pain   ? ?  ?  ? ?  ? ? ?Capillary Blood Glucose: ?No results found for this or any previous visit (from the past 24 hour(s)). ? ? ? ?Social History  ? ?Tobacco Use  ?Smoking Status Former  ? Packs/day: 1.50  ? Years: 33.00  ? Pack years: 49.50  ? Types: Cigarettes  ? Quit date: 06/04/2019  ? Years since quitting: 1.9  ?Smokeless Tobacco Former  ? ? ?Goals Met:  ?Independence with exercise equipment ?Exercise tolerated well ?No report of concerns or symptoms today ?Strength training completed today ? ?Goals Unmet:  ?Not Applicable ? ?Comments: Service time is from 1320 to 1430.  ? ? ?Dr. Rodman Pickle is Medical Director for Pulmonary Rehab at [facility].  ?

## 2021-05-06 ENCOUNTER — Encounter (HOSPITAL_COMMUNITY)
Admission: RE | Admit: 2021-05-06 | Discharge: 2021-05-06 | Disposition: A | Discharge status: Home / Self Care | Payer: 59 | Source: Ambulatory Visit | Attending: Pulmonary Disease | Admitting: Pulmonary Disease

## 2021-05-06 ENCOUNTER — Other Ambulatory Visit: Payer: Self-pay

## 2021-05-06 VITALS — Wt 203.7 lb

## 2021-05-06 DIAGNOSIS — J438 Other emphysema: Secondary | ICD-10-CM | POA: Diagnosis not present

## 2021-05-06 NOTE — Progress Notes (Signed)
Daily Session Note ? ?Patient Details  ?Name: Jason Munoz ?MRN: [id number] ?Date of Birth: Apr 20, 1955 ?Referring Provider:   ?Flowsheet Row Pulmonary Rehab Walk Test from 04/23/2021 in [place]  ?Referring Provider Dewald  ? ?  ? ? ?Encounter Date: 05/06/2021 ? ?Check In: ? Session Check In - 05/06/21 1422   ? ?  ? Check-In  ? Supervising physician immediately available to respond to emergencies [facility] immediately available   ? Physician(s) Dr. Cruzita Lederer   ? [facility]   ? Staff Present Rosebud Poles, RN, Quentin Ore, MS, ACSM-CEP, Exercise Physiologist;Carlette Wilber Oliphant, RN, Deland Pretty, MS, ACSM CEP, Exercise Physiologist   ? Virtual Visit No   ? Medication changes reported     No   ? Fall or balance concerns reported    No   ? Tobacco Cessation No Change   ? Warm-up and Cool-down Performed as group-led instruction   ? Resistance Training Performed Yes   ? VAD Patient? No   ? PAD/SET Patient? No   ?  ? Pain Assessment  ? Currently in Pain? No/denies   ? Multiple Pain Sites No   ? ?  ?  ? ?  ? ? ?Capillary Blood Glucose: ?No results found for this or any previous visit (from the past 24 hour(s)). ? ? Exercise Prescription Changes - 05/06/21 1500   ? ?  ? Response to Exercise  ? Blood Pressure (Admit) 122/64   ? Blood Pressure (Exercise) 122/78   ? Blood Pressure (Exit) 96/62   ? Heart Rate (Admit) 64 bpm   ? Heart Rate (Exercise) 87 bpm   ? Heart Rate (Exit) 80 bpm   ? Oxygen Saturation (Admit) 96 %   ? Oxygen Saturation (Exercise) 97 %   ? Oxygen Saturation (Exit) 96 %   ? Rating of Perceived Exertion (Exercise) 11   ? Perceived Dyspnea (Exercise) 2   ? Duration Continue with 30 min of aerobic exercise without signs/symptoms of physical distress.   ? Intensity --   40-80% HRR  ?  ? Resistance Training  ? Training Prescription Yes   ? Weight blue bands   ? Reps 10-15   ? Time 10 Minutes   ?  ? Recumbant Bike  ? Level 3   ? Minutes 15    ? METs 4.4   ?  ? NuStep  ? Level 2   ? SPM 80   ? Minutes 15   ? METs 2.8   ? ?  ?  ? ?  ? ? ?Social History  ? ?Tobacco Use  ?Smoking Status Former  ? Packs/day: 1.50  ? Years: 33.00  ? Pack years: 49.50  ? Types: Cigarettes  ? Quit date: 06/04/2019  ? Years since quitting: 1.9  ?Smokeless Tobacco Former  ? ? ?Goals Met:  ?Proper associated with RPD/PD & O2 Sat ?Exercise tolerated well ?No report of concerns or symptoms today ?Strength training completed today ? ?Goals Unmet:  ?Not Applicable ? ?Comments: Service time is from 1324 to 1435 ? ? ? ?Dr. Rodman Pickle is Medical Director for Pulmonary Rehab at [facility].  ?

## 2021-05-08 ENCOUNTER — Encounter (HOSPITAL_COMMUNITY): Payer: 59

## 2021-05-13 ENCOUNTER — Encounter (HOSPITAL_COMMUNITY): Payer: 59

## 2021-05-13 NOTE — Progress Notes (Signed)
Discharge Progress Report ? ?Patient Details  ?Name: Jason Munoz ?MRN: [id number] ?Date of Birth: 10/15/1955 ?Referring Provider:   ?Flowsheet Row Pulmonary Rehab Walk Test from 04/23/2021 in [place]  ?Referring Provider Dewald  ? ?  ? ? ? ?Number of Visits: 3 ? ?Reason for Discharge:  ?Early Discharge: ?Rayven called after attending 3 sessions and decided that he wanted to be discharged. He states that he has to drive to far to attend. This was discussed with the pt at orientation but he wanted to come to this program. ? ?Smoking History:  ?Social History  ? ?Tobacco Use  ?Smoking Status Former  ? Packs/day: 1.50  ? Years: 33.00  ? Pack years: 49.50  ? Types: Cigarettes  ? Quit date: 06/04/2019  ? Years since quitting: 1.9  ?Smokeless Tobacco Former  ? ? ?Diagnosis:  ?Paraseptal emphysema ([place]) ? ?ADL [facility]: ? Pulmonary Assessment Scores   ? ? [place] Name 04/23/21 0936  ?  ?  ?  ? ADL [facility]  ? ADL Phase Entry    ? SOB Score total 47    ?  ? CAT Score  ? CAT Score 24    ?  ? mMRC Score  ? mMRC Score 3    ? ?  ?  ? ?  ? ? ?Initial Exercise Prescription: ? Initial Exercise Prescription - 04/23/21 1000   ? ?  ? Date of Initial Exercise RX and Referring Provider  ? Date 04/23/21   ? Referring Provider Dewald   ? Expected Discharge Date 06/26/21   ?  ? Recumbant Bike  ? Level 1   ? [place] 25   ? Minutes 15   ?  ? NuStep  ? Level 1   ? SPM 70   ? Minutes 15   ?  ? Prescription Details  ? Frequency (times per week) 2   ? Duration Progress to 30 minutes of continuous aerobic without signs/symptoms of physical distress   ?  ? Intensity  ? THRR 40-80% of Max Heartrate 63-126   ? Ratings of Perceived Exertion 11-13   ? Perceived Dyspnea 0-4   ?  ? Progression  ? Progression Continue to progress workloads to maintain intensity without signs/symptoms of physical distress.   ?  ? Resistance Training  ? Training Prescription Yes   ? Weight blue bands   ? Reps 10-15   ? ?  ?  ? ?  ? ? ?Discharge Exercise  Prescription (Final Exercise Prescription Changes): ? Exercise Prescription Changes - 05/06/21 1500   ? ?  ? Response to Exercise  ? Blood Pressure (Admit) 122/64   ? Blood Pressure (Exercise) 122/78   ? Blood Pressure (Exit) 96/62   ? Heart Rate (Admit) 64 bpm   ? Heart Rate (Exercise) 87 bpm   ? Heart Rate (Exit) 80 bpm   ? Oxygen Saturation (Admit) 96 %   ? Oxygen Saturation (Exercise) 97 %   ? Oxygen Saturation (Exit) 96 %   ? Rating of Perceived Exertion (Exercise) 11   ? Perceived Dyspnea (Exercise) 2   ? Duration Continue with 30 min of aerobic exercise without signs/symptoms of physical distress.   ? Intensity --   40-80% HRR  ?  ? Resistance Training  ? Training Prescription Yes   ? Weight blue bands   ? Reps 10-15   ? Time 10 Minutes   ?  ? Recumbant Bike  ? Level 3   ?  Minutes 15   ? METs 4.4   ?  ? NuStep  ? Level 2   ? SPM 80   ? Minutes 15   ? METs 2.8   ? ?  ?  ? ?  ? ? ?Functional Capacity: ? 6 Minute Walk   ? ? [place] Name 04/23/21 1002  ?  ?  ?  ? 6 Minute Walk  ? Phase Initial    ? Distance 1600 feet    ? Walk Time 6 minutes    ? # of Rest Breaks 0    ? MPH 3.03    ? METS 3.4    ? RPE 12    ? Perceived Dyspnea  1    ? VO2 Peak 11.89    ? Symptoms No    ? Resting HR 58 bpm    ? Resting BP 112/68    ? Resting Oxygen Saturation  96 %    ? Exercise Oxygen Saturation  during 6 min walk 91 %    ? Max Ex. HR 89 bpm    ? Max Ex. BP 128/70    ? 2 Minute Post BP 118/68    ?  ? Interval HR  ? 1 Minute HR 73    ? 2 Minute HR 69    ? 3 Minute HR 69    ? 4 Minute HR 89    ? 5 Minute HR 86    ? 6 Minute HR 87    ? 2 Minute Post HR 59    ? Interval Heart Rate? Yes    ?  ? Interval Oxygen  ? Interval Oxygen? Yes    ? Baseline Oxygen Saturation % 96 %    ? 1 Minute Oxygen Saturation % 95 %    ? 1 Minute Liters of Oxygen 0 L    ? 2 Minute Oxygen Saturation % 93 %    ? 2 Minute Liters of Oxygen 0 L    ? 3 Minute Oxygen Saturation % 91 %    ? 3 Minute Liters of Oxygen 0 L    ? 4 Minute Oxygen Saturation % 91 %    ? 4  Minute Liters of Oxygen 0 L    ? 5 Minute Oxygen Saturation % 93 %    ? 5 Minute Liters of Oxygen 0 L    ? 6 Minute Oxygen Saturation % 91 %    ? 6 Minute Liters of Oxygen 0 L    ? 2 Minute Post Oxygen Saturation % 99 %    ? 2 Minute Post Liters of Oxygen 0 L    ? ?  ?  ? ?  ? ? ?Psychological, QOL, Others - Outcomes: ?PHQ 2/9: ? ?  04/23/2021  ?  9:19 AM 04/23/2021  ?  9:02 AM  ?Depression screen PHQ 2/9  ?Decreased Interest 0 0  ?Down, Depressed, Hopeless 0 0  ?PHQ - 2 Score 0 0  ?Altered sleeping 0   ?Tired, decreased energy 3   ?Change in appetite 0   ?Feeling bad or failure about yourself  0   ?Trouble concentrating 0   ?Moving slowly or fidgety/restless 0   ?Suicidal thoughts 0   ?PHQ-9 Score 3   ?Difficult doing work/chores Not difficult at all   ? ? ?Quality of Life: ? ? ?Personal Goals: ?Goals established at orientation with interventions provided to work toward goal. ? Personal Goals and Risk Factors at Admission - 04/23/21 0943   ? ?  ?  Core Components/Risk Factors/Patient Goals on Admission  ? Improve shortness of breath with ADL's Yes   ? Intervention Provide education, individualized exercise plan and daily activity instruction to help decrease symptoms of SOB with activities of daily living.   ? Expected Outcomes Short Term: Improve cardiorespiratory fitness to achieve a reduction of symptoms when performing ADLs;Long Term: Be able to perform more ADLs without symptoms or delay the onset of symptoms   ? Increase knowledge of respiratory medications and ability to use respiratory devices properly  Yes   ? Intervention Provide education and demonstration as needed of appropriate use of medications, inhalers, and oxygen therapy.   ? Expected Outcomes Short Term: Achieves understanding of medications use. Understands that oxygen is a medication prescribed by physician. Demonstrates appropriate use of inhaler and oxygen therapy.;Long Term: Maintain appropriate use of medications, inhalers, and oxygen therapy.    ? ?  ?  ? ?  ?  ? ?Personal Goals Discharge: ? Goals and Risk Factor Review   ? ? [place] Name 04/23/21 [phone]  ?  ?  ?  ?  ?  ? Core Components/Risk Factors/Patient Goals Review  ? Personal Goals Review Develop more efficient breathing techniques such as purse lipped breathing and diaphragmatic breathing and practicing self-pacing with activity.;Increase knowledge of respiratory medications and ability to use respiratory devices properly.;Improve shortness of breath with ADL's      ? ?  ?  ? ?  ? ? ?Exercise Goals and Review: ? Exercise Goals   ? ? [place] Name 04/23/21 0350 05/06/21 0938  ?  ?  ?  ?  ? Exercise Goals  ? Increase Physical Activity Yes Yes     ? Intervention Provide advice, education, support and counseling about physical activity/exercise needs.;Develop an individualized exercise prescription for aerobic and resistive training based on initial evaluation findings, risk stratification, comorbidities and participant's personal goals. Provide advice, education, support and counseling about physical activity/exercise needs.;Develop an individualized exercise prescription for aerobic and resistive training based on initial evaluation findings, risk stratification, comorbidities and participant's personal goals.     ? Expected Outcomes Short Term: Attend rehab on a regular basis to increase amount of physical activity.;Long Term: Add in home exercise to make exercise part of routine and to increase amount of physical activity.;Long Term: Exercising regularly at least 3-5 days a week. Short Term: Attend rehab on a regular basis to increase amount of physical activity.;Long Term: Add in home exercise to make exercise part of routine and to increase amount of physical activity.;Long Term: Exercising regularly at least 3-5 days a week.     ? Increase Strength and Stamina Yes Yes     ? Intervention Provide advice, education, support and counseling about physical activity/exercise needs.;Develop an individualized  exercise prescription for aerobic and resistive training based on initial evaluation findings, risk stratification, comorbidities and participant's personal goals. Provide advice, education, support and cou

## 2021-05-15 ENCOUNTER — Encounter (HOSPITAL_COMMUNITY): Payer: 59

## 2021-05-20 ENCOUNTER — Encounter (HOSPITAL_COMMUNITY): Payer: 59

## 2021-05-22 ENCOUNTER — Encounter (HOSPITAL_COMMUNITY): Payer: 59

## 2021-05-27 ENCOUNTER — Encounter (HOSPITAL_COMMUNITY): Payer: 59

## 2021-05-29 ENCOUNTER — Encounter (HOSPITAL_COMMUNITY): Payer: 59

## 2021-05-31 NOTE — Progress Notes (Signed)
?Cardiology Office Note:   ? ?Date:  06/05/2021  ? ?ID:  Jason Munoz, DOB October 14, 1955, MRN [id number] ? ?PCP:  Geoffry Paradise, MD  ?[facility] Cardiologist:   [place], MD  ?[facility] Electrophysiologist:  None  ? ?Referring MD: Geoffry Paradise, MD  ? ?Chief Complaint  ?Patient presents with  ? Coronary Artery Disease  ? ? ?History of Present Illness:   ? ?Jason Munoz is a 66 y.o. male with a hx of CAD, hyperlipidemia, AAA, obstructive sleep apnea, GERD and tobacco abuse.  He underwent BMS to LAD in 2006.   ?He was seen by Azalee Course PA-C on 08/04/2019. He was complaining of worsening dyspnea on exertion at the time. He quit smoking in April however has since noticing intermittent chest tightness across the entire chest.   Echocardiogram obtained on 08/24/2019 showed EF 60 to 65%, grade 1 DD.  Myoview obtained on 08/17/2019 showed EF 46%, small defect of mild severity present in the basal inferior and mid inferior location, this is consistent with ischemia, overall it was a low risk study. He continued to have symptoms so a  Cardiac catheterization was performed on 09/01/2019 revealed single-vessel disease with 100% proximal to mid RCA occlusion, patent proximal LAD stent, 60% mid LAD disease, 40% OM1 lesion.  Normal right heart pressure.  It was felt that his symptom may be related to RCA CTO, therefore he was brought back to the hospital on 09/06/2019 for CTO PCI.  He eventually received Synergy 2.5 x 32 mm DES to RCA.  Postprocedure, he was placed on aspirin and Plavix with instruction to continue this for 1 year.  ? ?Ultrasound obtained in 2020 showed AAA measuring at 3.4 cm.   CT abdomen and pelvis with contrast on 08/03/2019, this revealed a stable 3.4 cm infrarenal abdominal aortic aneurysm.  Recommend follow-up ultrasound in 3 years.  ? ?In the fall he was seen in the lipid clinic and started on Praluent. Excellent response.  ? ?On his last visit he was having a lot of trouble with his breathing  and feeling tired during the day. He had a new sleep study and his CPAP was adjusted. Since then his breathing improved ? ?He reports he had a spinal injection in March and this gave him a lot more energy and he has been able to walk more. He reports only one episode of chest pain.  ? ?He has been under a lot of stress. Going through a divorce. Found his son dead last week. Apparently his son had problems with Etoh abuse and may have had a seizure.  ? ?Past Medical History:  ?Diagnosis Date  ? AAA (abdominal aortic aneurysm) (HCC) 09/2018  ? 3.4cm  ? Allergy   ? Anxiety   ? Arthritis   ? Colitis   ? Coronary artery disease 01/2005  ? stent LAD in 2006  ? COVID-19 01/2020  ? Emphysema lung (HCC)   ? GERD (gastroesophageal reflux disease)   ? HTN (hypertension)   ? Hypercholesteremia   ? Myocardial infarction [facility])   ? Sleep apnea   ? uses cpap  ? Snoring   ? Substance abuse (HCC)   ? Umbilical hernia   ? Ventral hernia   ? ? ?Past Surgical History:  ?Procedure Laterality Date  ? 2006    ? APPENDECTOMY    ? bilateral ankle fractures    ? CARPAL TUNNEL RELEASE    ? COLONOSCOPY    ? CORONARY ANGIOGRAPHY N/A 09/06/2019  ?  Procedure: CORONARY ANGIOGRAPHY;  Surgeon: SwazilandJordan,  M, MD;  [facility];  Service: Cardiovascular;  Laterality: N/A;  ? CORONARY CTO INTERVENTION N/A 09/06/2019  ? Procedure: CORONARY CTO INTERVENTION;  Surgeon: [place]azilandJordan,  M, MD;  [facility];  Service: Cardiovascular;  Laterality: N/A;  RCA  ? ELBOW SURGERY Right   ? EXTERNAL EAR SURGERY    ? INTRAVASCULAR ULTRASOUND/IVUS N/A 09/06/2019  ? Procedure: Intravascular Ultrasound/IVUS;  Surgeon: [place]azilandJordan,  M, MD;  [facility];  Service: Cardiovascular;  Laterality: N/A;  ? NECK SURGERY    ? RIGHT/LEFT HEART CATH AND CORONARY ANGIOGRAPHY N/A 09/01/2019  ? Procedure: RIGHT/LEFT HEART CATH AND CORONARY ANGIOGRAPHY;  Surgeon: [place]ilandJordan,  M, MD;  [facility];  Service: Cardiovascular;   Laterality: N/A;  ? UPPER GASTROINTESTINAL ENDOSCOPY    ? ? ?Current Medications: ?Current Meds  ?Medication Sig  ? albuterol (VENTOLIN HFA) 108 (90 Base) MCG/ACT inhaler Inhale 1-2 puffs into the lungs every 4 (four) hours as needed for wheezing or shortness of breath.  ? aspirin 81 MG chewable tablet Chew 81 mg by mouth daily.   ? atorvastatin (LIPITOR) 40 MG tablet TAKE 1 TABLET BY MOUTH EVERY DAY  ? busPIRone (BUSPAR) 10 MG tablet Take 1 tablet (10 mg total) by mouth at bedtime.  ? celecoxib (CELEBREX) 200 MG capsule Take 200 mg by mouth daily.  ? escitalopram (LEXAPRO) 10 MG tablet Take 10 mg by mouth daily.   ? esomeprazole (NEXIUM) 40 MG capsule Take 1 capsule (40 mg total) by mouth daily at 12 noon.  ? Fluticasone-Umeclidin-Vilant (TRELEGY ELLIPTA) 100-62.5-25 MCG/INH AEPB Inhale 1 puff into the lungs daily.  ? ipratropium (ATROVENT) 0.06 % nasal spray 1-2 sprays in each nostril  ? levocetirizine (XYZAL) 5 MG tablet Take 5 mg by mouth daily.  ? metoprolol succinate (TOPROL-XL) 25 MG 24 hr tablet Take 25 mg by mouth every evening.  ? Multiple Vitamin (MULTIVITAMIN WITH MINERALS) TABS tablet Take 1 tablet by mouth daily with lunch.  ? nitroGLYCERIN (NITROSTAT) 0.4 MG SL tablet PLACE 1 TABLET (0.4 MG TOTAL) UNDER THE TONGUE EVERY 5 (FIVE) MINUTES AS NEEDED FOR CHEST PAIN (UP TO 3 DOSES. IF CHEST PAIN PERSISTENT AFTER THE 3RD DOSE, CALL 911).  ? pantoprazole (PROTONIX) 40 MG tablet Take 40 mg by mouth 2 (two) times daily.  ? sildenafil (VIAGRA) 100 MG tablet   ? tamsulosin (FLOMAX) 0.4 MG CAPS capsule Take 0.4 mg by mouth every evening.   ? valACYclovir (VALTREX) 500 MG tablet Take 500 mg by mouth daily.   ? [DISCONTINUED] Alirocumab (PRALUENT) 150 MG/ML SOAJ Inject 150 mg into the skin every 14 (fourteen) days. NEED OV.  ? [DISCONTINUED] clopidogrel (PLAVIX) 75 MG tablet TAKE 1 TABLET BY MOUTH EVERY DAY  ?  ? ?Allergies:   Patient has no known allergies.  ? ?Social History  ? ?Socioeconomic History  ? Marital  status: Married  ?  Spouse name: Not on file  ? Number of children: 3  ? Years of education: HS  ? Highest education level: Not on file  ?Occupational History  ? Occupation: retired   ?  Employer: [employer]  ?Tobacco Use  ? Smoking status: Former  ?  Packs/day: 1.50  ?  Years: 33.00  ?  Pack years: 49.50  ?  Types: Cigarettes  ?  Quit date: 06/04/2019  ?  Years since quitting: 2.0  ? Smokeless tobacco: Former  ?Vaping Use  ?  Vaping Use: Former  ?Substance and Sexual Activity  ? Alcohol use: Never  ? Drug use: No  ? Sexual activity: Yes  ?  Birth control/protection: None  ?Other Topics Concern  ? Not on file  ?Social History Narrative  ? Lives at home with his wife.  ? Right-handed.  ? No caffeine use.  ? ?Social Determinants of Health  ? ?Financial Resource Strain: Not on file  ?Food Insecurity: Not on file  ?Transportation Needs: Not on file  ?Physical Activity: Not on file  ?Stress: Not on file  ?Social Connections: Not on file  ?  ? ?Family History: ?The patient's family history includes COPD in his father. There is no history of Stomach cancer, Rectal cancer, Esophageal cancer, Colon cancer, or Colon polyps. ? ?ROS:   ?Please see the history of present illness.    ? All other systems reviewed and are negative. ? ?EKGs/Labs/Other Studies Reviewed:   ? ?The following studies were reviewed today: ? ?Cath 09/01/2019 ?Prox LAD lesion is 35% stenosed. ?Mid LAD lesion is 60% stenosed. ?Prox RCA to Mid RCA lesion is 100% stenosed. ?1st Mrg lesion is 40% stenosed. ?LV end diastolic pressure is normal. ?  ?1. Single vessel occlusive CAD involving the RCA. The prior stent in the proximal LAD is still patent. There is a borderline stenosis in the mid LAD ?2. Normal LV filling pressures. ?3. Normal right heart pressures ?4. Normal cardiac output. ?  ?Plan: based on findings today and recent stress test I feel his symptoms are related to the RCA occlusion. Symptoms have been present for 3-4 months. He is on good medical  therapy. Options are to attempt CTO PCI versus increasing medical therapy. Will discuss with patient.  ? ? ?Cath 09/06/2019 ?Prox LAD lesion is 35% stenosed. ?Mid LAD lesion is 60% stenosed. ?1st Mrg les

## 2021-06-03 ENCOUNTER — Encounter (HOSPITAL_COMMUNITY): Payer: 59

## 2021-06-05 ENCOUNTER — Ambulatory Visit: Payer: 59 | Admitting: Cardiology

## 2021-06-05 ENCOUNTER — Encounter: Payer: Self-pay | Admitting: Cardiology

## 2021-06-05 ENCOUNTER — Encounter (HOSPITAL_COMMUNITY): Payer: 59

## 2021-06-05 VITALS — BP 120/82 | HR 69 | Ht 66.0 in | Wt 193.4 lb

## 2021-06-05 DIAGNOSIS — I10 Essential (primary) hypertension: Secondary | ICD-10-CM

## 2021-06-05 DIAGNOSIS — E7800 Pure hypercholesterolemia, unspecified: Secondary | ICD-10-CM

## 2021-06-05 DIAGNOSIS — I2510 Atherosclerotic heart disease of native coronary artery without angina pectoris: Secondary | ICD-10-CM

## 2021-06-05 DIAGNOSIS — I7143 Infrarenal abdominal aortic aneurysm, without rupture: Secondary | ICD-10-CM

## 2021-06-05 MED ORDER — PRALUENT 150 MG/ML ~~LOC~~ SOAJ: 150.0000 mg | SUBCUTANEOUS | 0 refills | Status: DC

## 2021-06-05 MED ORDER — PRALUENT 150 MG/ML ~~LOC~~ SOAJ: 150.0000 mg | SUBCUTANEOUS | 11 refills | Status: DC

## 2021-06-10 ENCOUNTER — Encounter (HOSPITAL_COMMUNITY): Payer: 59

## 2021-06-12 ENCOUNTER — Encounter (HOSPITAL_COMMUNITY): Payer: 59

## 2021-06-16 IMAGING — CT CT CHEST LUNG CANCER SCREENING LOW DOSE W/O CM
2 of 4 series · 15 of 36 positions shown, 18 images · non-contrast
Comparison: None.

CLINICAL DATA: Lung cancer screening. 48 pack-year history. Former
asymptomatic smoker.

EXAM:
CT CHEST WITHOUT CONTRAST LOW-DOSE FOR LUNG CANCER SCREENING
TECHNIQUE: Multidetector CT imaging of the chest was performed following the
standard protocol without IV contrast.

[Series 4: coronal · coronal · 0.51mm/px · 3 of 288 slices shown]
[im 58/288  lung]
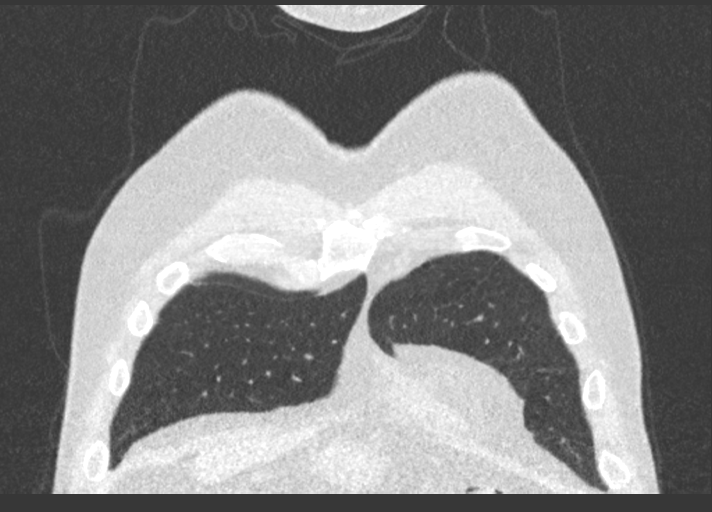
[im 115/288  lung]
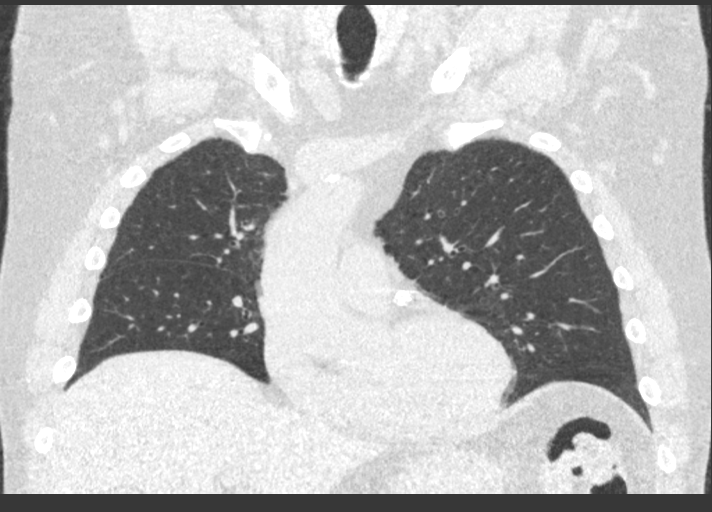
[im 173/288  lung]
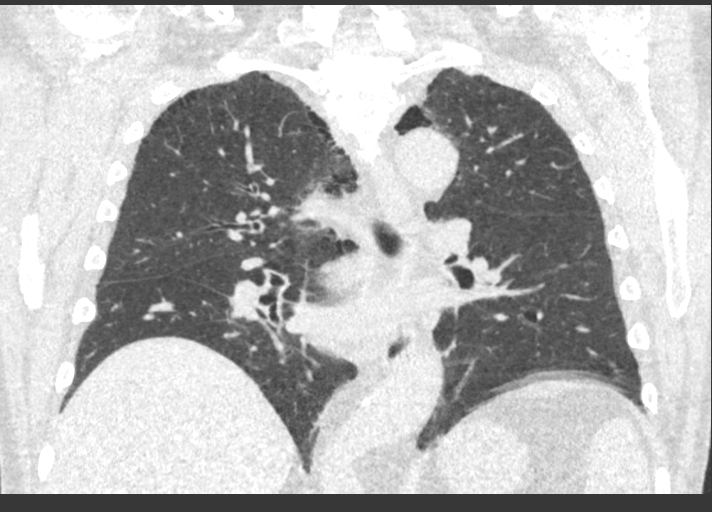

[Series 6: lungs · axial · 0.75mm/px · z∈[-264,-40]mm · 12 of 248 slices shown, 15 images]
[im 12/248  mediastinal]
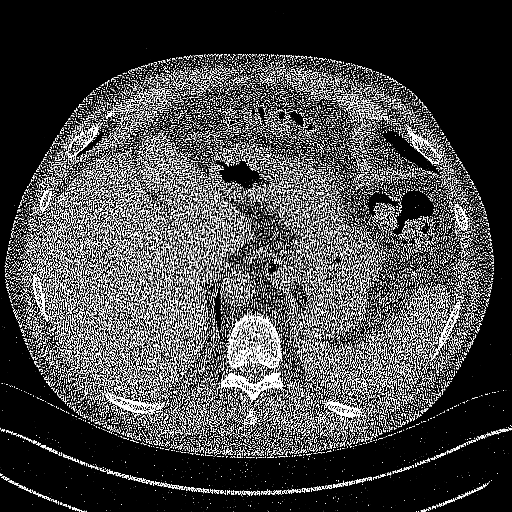
[im 12/248  lung]
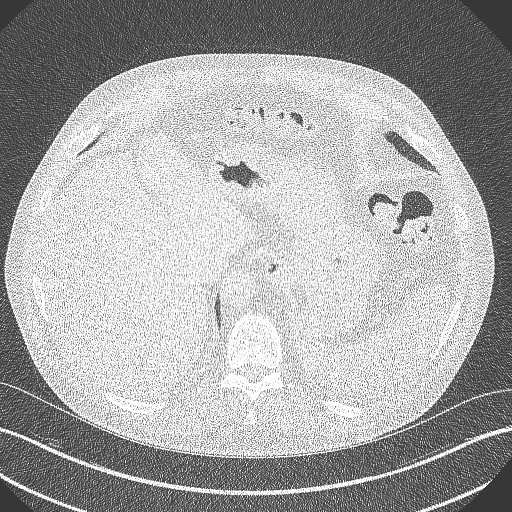
[im 34/248  lung]
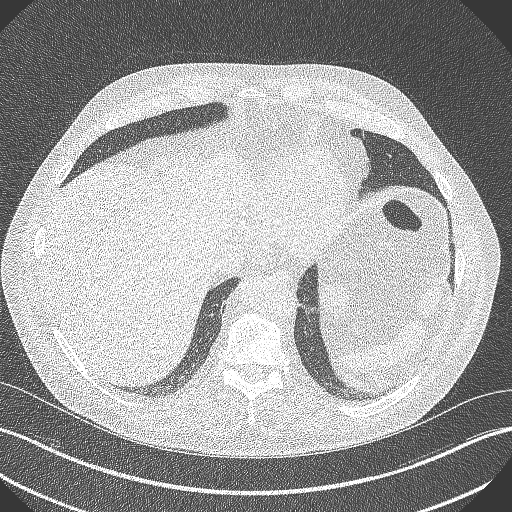
[im 57/248  lung]
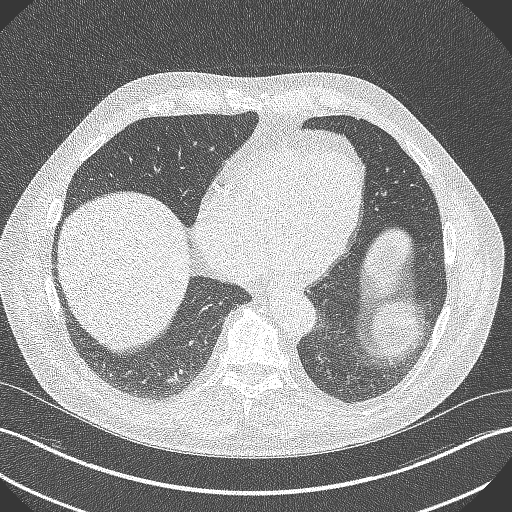
[im 79/248  lung]
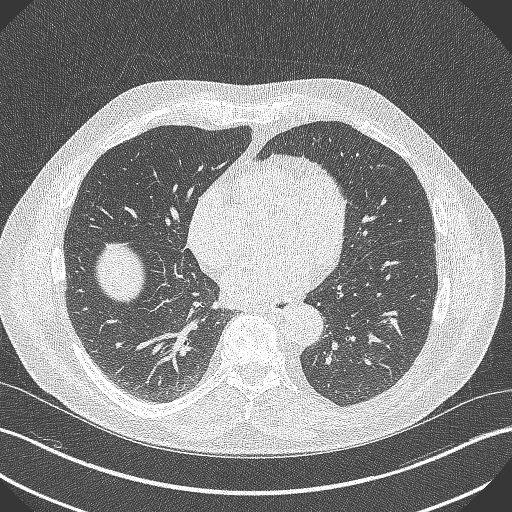
[im 90/248  mediastinal]
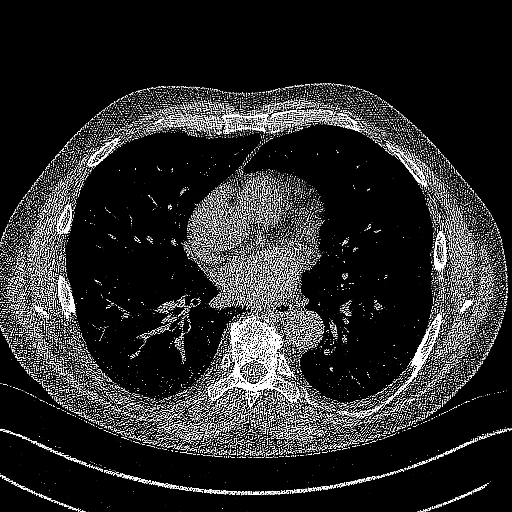
[im 90/248  lung]
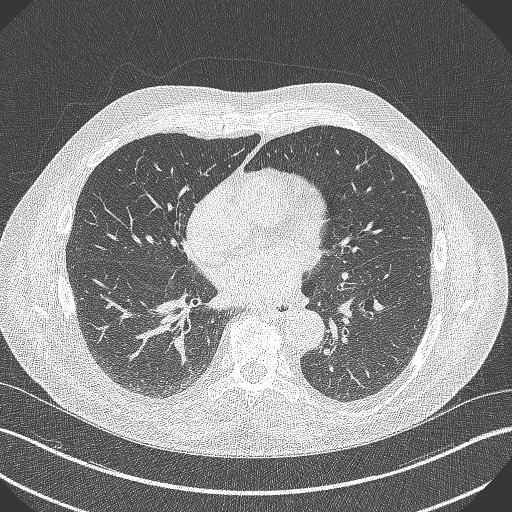
[im 113/248  lung]
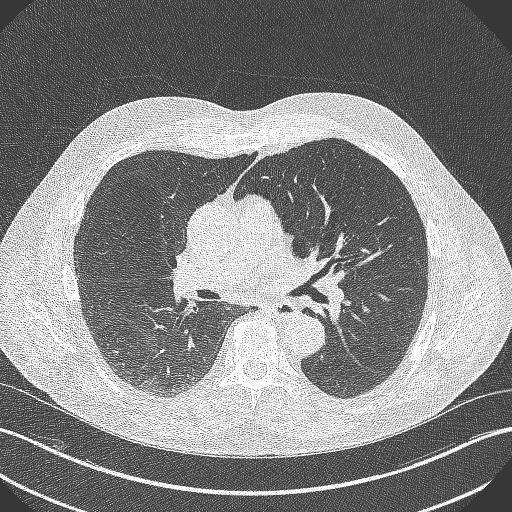
[im 135/248  lung]
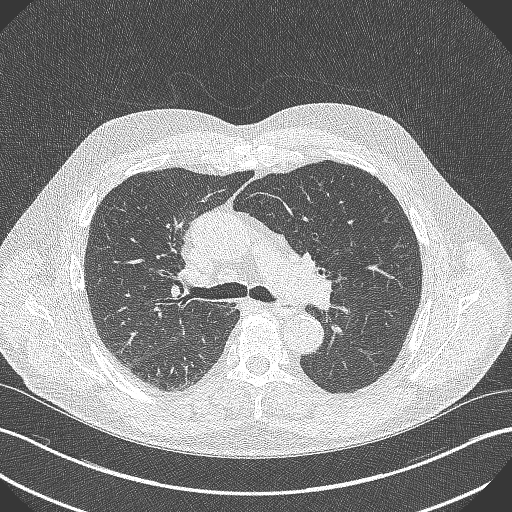
[im 158/248  lung]
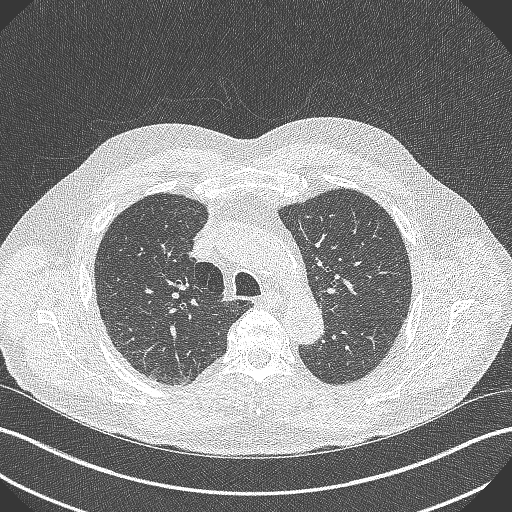
[im 169/248  mediastinal]
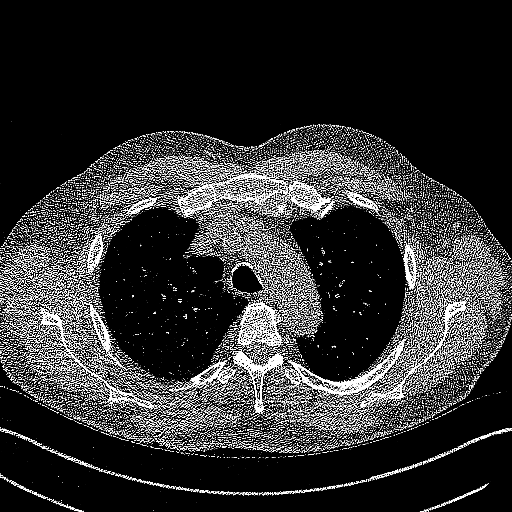
[im 169/248  lung]
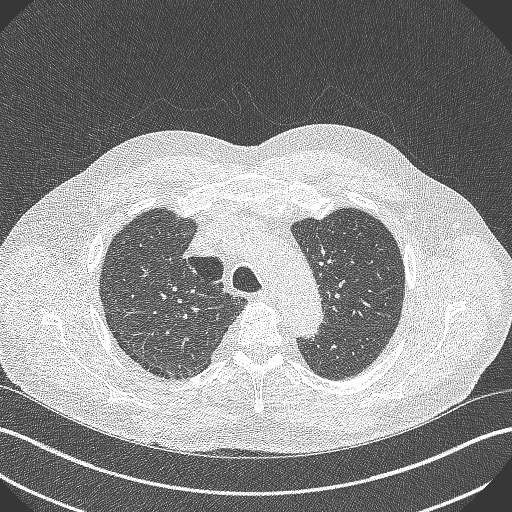
[im 191/248  lung]
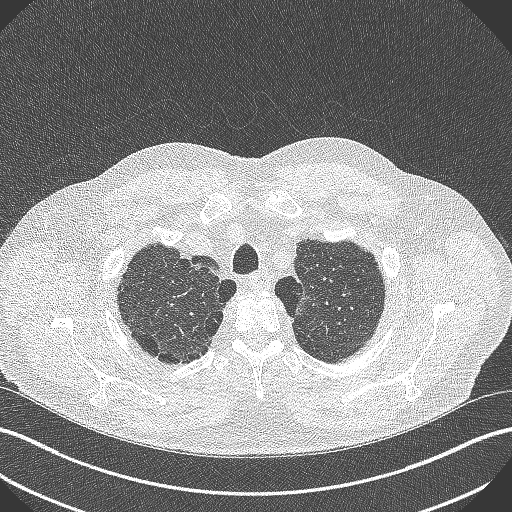
[im 214/248  lung]
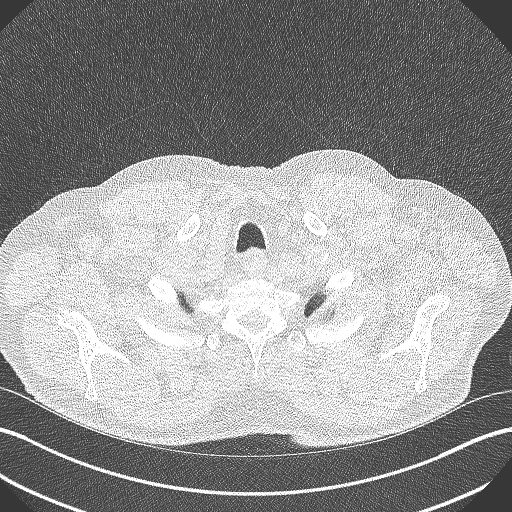
[im 236/248  lung]
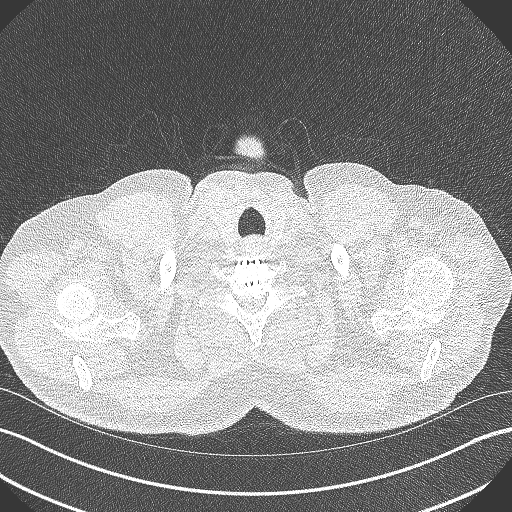

[15 of 36 positions shown; findings below may reference images not displayed]

FINDINGS: Cardiovascular: The heart size is normal. No pericardial effusion
identified aortic atherosclerosis. Coronary artery calcifications.

Mediastinum/Nodes: No enlarged mediastinal, hilar, or axillary lymph
nodes. Thyroid gland, trachea, and esophagus demonstrate no
significant findings.

Lungs/Pleura: No pleural effusion. Paraseptal and centrilobular
emphysema. No airspace consolidation, atelectasis or pneumothorax.
Nodule within the anterior basal right upper lobe has an equivalent
diameter of 4.9 mm. Calcified granuloma is noted within the medial
aspect of the left lung apex.

Upper Abdomen: No acute abnormality.

Musculoskeletal: Degenerative disc disease identified within the
thoracic spine. No acute or suspicious osseous findings. Previous
ACDF.
IMPRESSION: 1. Lung-RADS 2, benign appearance or behavior. Continue annual
screening with low-dose chest CT without contrast in 12 months.
2. Coronary artery calcifications.

Aortic Atherosclerosis (9UDL7-EJE.E) and Emphysema (9UDL7-AQN.Z).

## 2021-06-17 ENCOUNTER — Encounter (HOSPITAL_COMMUNITY): Payer: 59

## 2021-06-19 ENCOUNTER — Encounter (HOSPITAL_COMMUNITY): Payer: 59

## 2021-06-24 ENCOUNTER — Encounter (HOSPITAL_COMMUNITY): Payer: 59

## 2021-06-26 ENCOUNTER — Encounter (HOSPITAL_COMMUNITY): Payer: 59

## 2021-08-25 ENCOUNTER — Telehealth: Payer: Self-pay

## 2021-08-25 NOTE — Telephone Encounter (Signed)
Praluent prior authorization form completed and faxed to [facility] at fax # [phone]. 

## 2021-08-27 ENCOUNTER — Other Ambulatory Visit: Payer: Self-pay | Admitting: Pulmonary Disease

## 2021-10-21 ENCOUNTER — Other Ambulatory Visit: Payer: Self-pay | Admitting: Pulmonary Disease

## 2021-10-28 ENCOUNTER — Telehealth: Payer: Self-pay | Admitting: Cardiology

## 2021-10-28 MED ORDER — PRALUENT 150 MG/ML ~~LOC~~ SOAJ: 150.0000 mg | SUBCUTANEOUS | 11 refills | Status: DC

## 2021-10-28 NOTE — Telephone Encounter (Signed)
*  STAT* If patient is at the pharmacy, call can be transferred to refill team.   1. Which medications need to be refilled? (please list name of each medication and dose if known)  PRALUENT 150 MG/ML SOAJ  2. Which pharmacy/location (including street and city if local pharmacy) is medication to be sent to?  [id number], [place] - [address]  3. Do they need a 30 day or 90 day supply?   90 day supply   Patient states his next injection is scheduled for Friday, 9/15 and he is completely out of medication.

## 2021-10-30 ENCOUNTER — Other Ambulatory Visit: Payer: Self-pay | Admitting: Cardiology

## 2021-10-30 ENCOUNTER — Ambulatory Visit: Payer: 59 | Admitting: Nurse Practitioner

## 2021-11-03 NOTE — Telephone Encounter (Signed)
*  STAT* If patient is at the pharmacy, call can be transferred to refill team.   1. Which medications need to be refilled? (please list name of each medication and dose if known)  Repatha  2. Which pharmacy/location (including street and city if local pharmacy) is medication to be sent to?[id number] [place], [place] - [address]  3. Do they need a 30 day or 90 day supply?

## 2021-11-04 NOTE — Telephone Encounter (Signed)
Patients insurance now prefers Repatha. PA submitted (Key: [id number]). Rx for Repatha sent. Patient made aware of the switch and educated on how to get a copay card.

## 2021-11-15 NOTE — Progress Notes (Unsigned)
Cardiology Office Note:    Date:  11/15/2021   ID:  Jason Munoz, DOB 17-Feb-1955, MRN [id number]  PCP:  Geoffry Paradise, MD  [facility] Cardiologist:   [place], MD  [facility] Electrophysiologist:  None   Referring MD: Geoffry Paradise, MD   No chief complaint on file.   History of Present Illness:    Jason Munoz is a 66 y.o. male with a hx of CAD, hyperlipidemia, AAA, obstructive sleep apnea, GERD and tobacco abuse.  He underwent BMS to LAD in 2006.   He was seen by Azalee Course PA-C on 08/04/2019. He was complaining of worsening dyspnea on exertion at the time. He quit smoking in April however has since noticing intermittent chest tightness across the entire chest.   Echocardiogram obtained on 08/24/2019 showed EF 60 to 65%, grade 1 DD.  Myoview obtained on 08/17/2019 showed EF 46%, small defect of mild severity present in the basal inferior and mid inferior location, this is consistent with ischemia, overall it was a low risk study. He continued to have symptoms so a  Cardiac catheterization was performed on 09/01/2019 revealed single-vessel disease with 100% proximal to mid RCA occlusion, patent proximal LAD stent, 60% mid LAD disease, 40% OM1 lesion.  Normal right heart pressure.  It was felt that his symptom may be related to RCA CTO, therefore he was brought back to the hospital on 09/06/2019 for CTO PCI.  He eventually received Synergy 2.5 x 32 mm DES to RCA.  Postprocedure, he was placed on aspirin and Plavix with instruction to continue this for 1 year.   Ultrasound obtained in 2020 showed AAA measuring at 3.4 cm.   CT abdomen and pelvis with contrast on 08/03/2019, this revealed a stable 3.4 cm infrarenal abdominal aortic aneurysm.  Recommend follow-up ultrasound in 3 years.   In the fall he was seen in the lipid clinic and started on Praluent. Excellent response.   On his last visit he was having a lot of trouble with his breathing and feeling tired during the day. He had a  new sleep study and his CPAP was adjusted. Since then his breathing improved  He reports he had a spinal injection in March and this gave him a lot more energy and he has been able to walk more. He reports only one episode of chest pain.   He was seen in the ED in [place] in August with abdominal pain. He was treated for diverticulitis. Abd CT did demonstrate a AAA at 4.2 cm.   He has been under a lot of stress. Going through a divorce. Found his son dead last week. Apparently his son had problems with Etoh abuse and may have had a seizure.   Past Medical History:  Diagnosis Date   AAA (abdominal aortic aneurysm) (HCC) 09/2018   3.4cm   Allergy    Anxiety    Arthritis    Colitis    Coronary artery disease 01/2005   stent LAD in 2006   COVID-19 01/2020   Emphysema lung (HCC)    GERD (gastroesophageal reflux disease)    HTN (hypertension)    Hypercholesteremia    Myocardial infarction [facility])    Sleep apnea    uses cpap   Snoring    Substance abuse (HCC)    Umbilical hernia    Ventral hernia     Past Surgical History:  Procedure Laterality Date   2006     APPENDECTOMY     bilateral ankle fractures  CARPAL TUNNEL RELEASE     COLONOSCOPY     CORONARY ANGIOGRAPHY N/A 09/06/2019   Procedure: CORONARY ANGIOGRAPHY;  Surgeon: Martinique,  M, MD;  Location: [facility];  Service: Cardiovascular;  Laterality: N/A;   CORONARY CTO INTERVENTION N/A 09/06/2019   Procedure: CORONARY CTO INTERVENTION;  Surgeon: Martinique,  M, MD;  Location: [facility];  Service: Cardiovascular;  Laterality: N/A;  RCA   ELBOW SURGERY Right    EXTERNAL EAR SURGERY     INTRAVASCULAR ULTRASOUND/IVUS N/A 09/06/2019   Procedure: Intravascular Ultrasound/IVUS;  Surgeon: Martinique,  M, MD;  Location: [facility];  Service: Cardiovascular;  Laterality: N/A;   NECK SURGERY     RIGHT/LEFT HEART CATH AND CORONARY ANGIOGRAPHY N/A 09/01/2019   Procedure: RIGHT/LEFT HEART CATH AND  CORONARY ANGIOGRAPHY;  Surgeon: Martinique,  M, MD;  Location: [facility];  Service: Cardiovascular;  Laterality: N/A;   UPPER GASTROINTESTINAL ENDOSCOPY      Current Medications: No outpatient medications have been marked as taking for the 11/17/21 encounter (Appointment) with Martinique,  M, MD.     Allergies:   Patient has no known allergies.   Social History   Socioeconomic History   Marital status: Married    Spouse name: Not on file   Number of children: 3   Years of education: HS   Highest education level: Not on file  Occupational History   Occupation: retired     Fish farm manager: [employer]  Tobacco Use   Smoking status: Former    Packs/day: 1.50    Years: 33.00    Total pack years: 49.50    Types: Cigarettes    Quit date: 06/04/2019    Years since quitting: 2.4   Smokeless tobacco: Former  Scientific laboratory technician Use: Former  Substance and Sexual Activity   Alcohol use: Never   Drug use: No   Sexual activity: Yes    Birth control/protection: None  Other Topics Concern   Not on file  Social History Narrative   Lives at home with his wife.   Right-handed.   No caffeine use.   Social Determinants of Health   Financial Resource Strain: Not on file  Food Insecurity: Not on file  Transportation Needs: Not on file  Physical Activity: Not on file  Stress: Not on file  Social Connections: Not on file     Family History: The patient's family history includes COPD in his father. There is no history of Stomach cancer, Rectal cancer, Esophageal cancer, Colon cancer, or Colon polyps.  ROS:   Please see the history of present illness.     All other systems reviewed and are negative.  EKGs/Labs/Other Studies Reviewed:    The following studies were reviewed today:  Cath 09/01/2019 Prox LAD lesion is 35% stenosed. Mid LAD lesion is 60% stenosed. Prox RCA to Mid RCA lesion is 100% stenosed. 1st Mrg lesion is 40% stenosed. LV end diastolic pressure is  normal.   1. Single vessel occlusive CAD involving the RCA. The prior stent in the proximal LAD is still patent. There is a borderline stenosis in the mid LAD 2. Normal LV filling pressures. 3. Normal right heart pressures 4. Normal cardiac output.   Plan: based on findings today and recent stress test I feel his symptoms are related to the RCA occlusion. Symptoms have been present for 3-4 months. He is on good medical therapy. Options are to attempt CTO PCI versus increasing medical therapy. Will  discuss with patient.    Cath 09/06/2019 Prox LAD lesion is 35% stenosed. Mid LAD lesion is 60% stenosed. 1st Mrg lesion is 40% stenosed. Prox RCA to Mid RCA lesion is 100% stenosed. Post intervention, there is a 0% residual stenosis. A drug-eluting stent was successfully placed using a SYNERGY XD 2.50X32.   1. Successful CTO PCI of the mid RCA with DES x 1 and IVUS guidance   Plan: observe overnight . Anticipate DC in am. DAPT for one year.    CT ANGIOGRAM OF THE ABDOMEN AND PELVIS   TECHNIQUE: CTA of the abdomen and pelvis was performed after the administration of 80 mL of Isovue-370 contrast.  Radiation dose reduction was utilized (automated exposure control, mA or kV adjustment based on patient size, or iterative image reconstruction).  Image post processing was performed creating multi planar 2D and angiographic 3D MIP, shaded surface rendering, or 3D volume rendering images for review and comparison.   COMPARISON:  CT 12/23/2018   INDICATION: abdominal pain, hx of AAA   FINDINGS:   VASCULAR:   - There is an infrarenal abdominal aortic aneurysm which measures 4.2 x 3.6 cm in greatest axial plane, previously 3.7 x 3 cm. Interval increase in noncalcified atheromatous plaque/mural thrombus along the posterior margin distally. There is also mild scattered calcified plaque. There is no wall thickening or surrounding inflammation.  - Patent celiac, superior mesenteric, renal, and inferior  mesenteric arteries.  - Normal caliber bilateral common iliac arteries.   NONVASCULAR:   - Lung Bases: Unremarkable.   - Liver: Normal contour. No definite liver lesions.   - Biliary Tree and Gallbladder: No biliary ductal dilatation.  The gallbladder is unremarkable.   - Pancreas: Normal.   - Spleen: Normal.   - Adrenal Glands: Normal.   - Kidneys and Bladder: No hydronephrosis. There is a simple right renal cyst which is stable; no specific follow-up is recommended. Bladder is unremarkable.   - Gastrointestinal Tract: No bowel obstruction or bowel wall thickening. There is mild colonic diverticulosis.   - Peritoneal Cavity and Retroperitoneum: No free fluid.  No free intraperitoneal air.   - Lymph Nodes: No retroperitoneal, mesenteric or pelvic lymphadenopathy.   - Reproductive: Unremarkable prostate. Vasectomy clips.   - Miscellaneous: Small fat-containing umbilical hernia.   MUSCULOSKELETAL:  - Remote healed left lateral rib fractures. There is severe multilevel degenerative disc disease throughout the visualized thoracic and lumbar spine. Chronic sclerosis at the bilateral sacroiliac joints is unchanged. Procedure Note  Eddie Candle, MD - 09/22/2021  Formatting of this note might be different from the original.  CT ANGIOGRAM OF THE ABDOMEN AND PELVIS    EKG:  EKG is ordered today. NSR rate 69. Normal. I have personally reviewed and interpreted this study.   Recent Labs: 04/07/2021: ALT 17  Recent Lipid Panel    Component Value Date/Time   CHOL 79 (L) 04/07/2021 1052   TRIG 112 04/07/2021 1052   HDL 51 04/07/2021 1052   CHOLHDL 1.5 04/07/2021 1052   LDLCALC 8 04/07/2021 1052     Dated 01/30/19: cholesterol 190, triglycerides 107, HDL 40, LDL 129.  Dated 08/03/19: A1c 5.8%.LFTs and TSH normal Dated 09/07/19: creatinine 1.44. otherwise BMET and CBC normal. Dated 03/11/20: cholesterol 64, triglycerides 90, HDL 36, LDL 10. potassium 5. LFTs normal. Dated  03/12/21: cholesterol 89, triglycerides 157, HDL 48, LDL 15. Creatinine 1.5. A1c 5.2%. otherwise CBC, CMET and TSH normal.   Physical Exam:    VS:  There were no vitals  taken for this visit.    Wt Readings from Last 3 Encounters:  06/05/21 193 lb 6.4 oz (87.7 kg)  05/06/21 203 lb 11.3 oz (92.4 kg)  04/23/21 199 lb 8.3 oz (90.5 kg)     GEN:  Well nourished, well developed in no acute distress HEENT: Normal NECK: No JVD; No carotid bruits LYMPHATICS: No lymphadenopathy CARDIAC: RRR, no murmurs, rubs, gallops RESPIRATORY:  Clear to auscultation without rales, wheezing or rhonchi  ABDOMEN: Soft, non-tender, non-distended MUSCULOSKELETAL:  No edema; No deformity  SKIN: Warm and dry NEUROLOGIC:  Alert and oriented x 3 PSYCHIATRIC:  Normal affect   ASSESSMENT:    No diagnosis found.   PLAN:    In order of problems listed above:  CAD: s/p remote BMS of LAD. S/p CTO PCI of RCA in July 2021. Rare chest pain. Continue ASA, Statin, and metoprolol  Hyperlipidemia:  Now on Praluent with great response. LDL down to 15.  On atorvastatin.  AAA: Previous CTA of the abdomen obtained on 08/03/2019 showed a stable 3.4 cm infrarenal abdominal aortic aneurysm, more recent CT showed increase in size to 4.2 cm.   4.   Dyspnea with history of tobacco abuse. Improved with CPAP adjustment.  5.   Situational stress   Medication Adjustments/Labs and Tests Ordered: Current medicines are reviewed at length with the patient today.  Concerns regarding medicines are outlined above.  No orders of the defined types were placed in this encounter.  No orders of the defined types were placed in this encounter.   There are no Patient Instructions on file for this visit.   Signed,  [place], MD  11/15/2021 10:23 AM    [facility]

## 2021-11-17 ENCOUNTER — Encounter: Payer: Self-pay | Admitting: Cardiology

## 2021-11-17 ENCOUNTER — Ambulatory Visit: Payer: 59 | Attending: Cardiology | Admitting: Cardiology

## 2021-11-17 VITALS — BP 100/80 | HR 81 | Ht 66.0 in | Wt 176.4 lb

## 2021-11-17 DIAGNOSIS — E7800 Pure hypercholesterolemia, unspecified: Secondary | ICD-10-CM | POA: Diagnosis not present

## 2021-11-17 DIAGNOSIS — I2510 Atherosclerotic heart disease of native coronary artery without angina pectoris: Secondary | ICD-10-CM | POA: Diagnosis not present

## 2021-11-17 DIAGNOSIS — I10 Essential (primary) hypertension: Secondary | ICD-10-CM | POA: Diagnosis not present

## 2021-11-17 DIAGNOSIS — I7143 Infrarenal abdominal aortic aneurysm, without rupture: Secondary | ICD-10-CM

## 2021-11-19 NOTE — Progress Notes (Signed)
PATIENT: Jason Munoz DOB: Nov 16, 1955  REASON FOR VISIT: follow up HISTORY FROM: patient  Chief Complaint  Patient presents with   Follow-up    Pt in room #2 and alone. Pt here today for f/u sleep apnea.     HISTORY OF PRESENT ILLNESS:  11/24/21 ALL:  Jason Munoz returns for follow up for complex (mostly obstructive) sleep apnea on CPAP. He was last seen 03/2020 and having difficulty meeting compliance. We started buspirone  30 minutes before bedtime and adjusted settings following titration study. Since, he reports doing well. He is no longer using buspirone. He is able to use CPAP nightly. He reports that his wife usually asks him to move to the guest room with their dog in the middle of the night due to the dog snoring. He states that he is usually able to use CPAP for 2-3 hours before having to switch rooms. He also uses CPAP with daytime naps. He enjoys about an hour nap every day after work. He denies EDS. He is able to stay awake if needed but enjoys lying down after work. He denies concerns with CPAP or supplies. He does get dry mouth on occasion. He is able to drink a sip of water and returns to sleep. He reports CPAP machine was set up late 2018.     03/26/2020 ALL: He returns for complex sleep apnea on CPAP compliance review. CPAP pressure changed from 11cmH20 to 13cmH20 at last visit with me in 12/2019. Review of download 02/13/2020 showed AHI remained elevated despite change and titration study ordered. He had titration study 03/21/2020 with Dr Vickey Huger. Study is currently in process of being read. Since last visit, he has continued to struggle with shortness of breath. Cardiology and pulmonology continue close follow up but feel symptoms are related to unmanaged sleep apnea. He also suffered from positive Covid 19 infection at the beginning of 2022. He is feeling better now. He continues to struggle with daily and four hour compliance. He has been better about using CPAP daily but  feels he has a hard time keeping it on all night. When he naps during the day (usually once a day) he can use CPAP without any problems. When he uses CPAP at night, he feels that he wrestles with the tubing and becomes irritated. He is using melatonin nightly to see if it will help with anxiety but has not noted any benefit. He sometimes takes 10-30mg  a night.   Compliance report dated 12/20/2019 through 03/18/2020 reveals that he used CPAP 79 of the past 90 days for compliance of 88%.  He used CPAP greater than 4 hours 26 of the past 90 days for compliance of 29%.  Average usage on days used was 3 hours and 4 minutes.  Residual AHI remains elevated at 26.1 events per hour with 22 central events and 3.5 obstructive.  He is on a set pressure of 13 cm of water, increased from 11 cm at last visit in November.  There is no significant leak.  12/21/2019 ALL:  Jason Munoz is a 66 y.o. male here today for follow up for OSA on CPAP. He admits that he has not been very compliant with CPAP therapy. He has had two cardiac cath procedures with stents placed since last being seen. He reports that he continues to have shob. He is followed by pulmonology.  He admits that he has not used CPAP consistently.  He has had some health concerns that have prevented use.  He continues to snore.  He has a hard time going to sleep.  Compliance report dated 11/20/2019 through 12/19/2019 reveals that he used CPAP 15 of the past 30 days for compliance of 50%.  He used CPAP for greater than 4 hours 6 of the past 30 days for compliance of 20%.  Average usage on days used was 3 hours and 31 minutes.  Residual AHI was 14.4 on a set pressure of 11 cm of water and EPR of 3.  There was no significant leak noted.  Of note, he had 10.6 central events, 3.1 obstructive events.  HISTORY: (copied from Dr Dohmeier's note on 02/13/2019)  02-13-2019-  Jason Munoz is a 66 y.o. male , was seen for multiple Rv by Sherril Croon. He had undergone a sleep  study on 11-20-2016 and diagnosed with mostly obstructive , complex sleep apnea at [facility] of only 7.9, RDI 9.3/h -  REM AHI 16.5/h. 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  I reviewed the patient's download and also he has used the machine on every also last 30 days he has not always been able to use it for longer than 2 hours, the average user time is 2 hours and 54 minutes on the, he uses CPAP with a set pressure of 11 cmH2O and 3 cm EPR, his residual AHI is 19.3 almost double the apnea count he had a baseline, and these are central dominant apneas.  My suspicion is that the patient has treatment emergent central apnea and needs to be titrated to a BiPAP, air leakage seems not to be the problem. He is using a nasal pillow, which he likes.  In summer he broke 3 ribs , had to sleep in a recliner, for 3 weeks in september he went out of town without CPAP, has abdominal pain, and was less and less compliant.  He went with his wife to [place] before christmas.    2018- seen here as in a referral/ revisit  from Dr. Jacky Kindle for sleep consultation. Jason Munoz reports tinnitus, fatigue, snoring, erectile dysfunction and a history of high cholesterol. He has also been treated for GERD. Hypertension has been treated with Toprol and Cozaar. He takes a baby aspirin daily. He smokes one pack per day drinks socially alcohol but does not use nonprescription drugs or caffeine. He has been working for many years at [employer] and is a Education officer, museum. His 12 hour shift will begin at 4 AM and an's at 4:30 PM, other days he will start at 8 AM to 8 PM. He still feels that he gets nighttime sleep and rest.   Sleep habits are as follows:  Usually the patient will come home after work to eat supper with his spouse, he may do some gardening or lawnmowing. And evening as usually spend watching TV. The patient had neck surgery, his wife hip surgery and there not participating in a regular exercise regimen at  this time. Bedtime is usually around 10:30 during the week. The patient reports falling asleep rather promptly, he sleeps on his side, on one pillow. The bedroom is quiet, cool and dark with a ceiling fan. He has up to 3 bathroom breaks each night to go to the bathroom and he wakes up frequently for other reasons 2. He has hip pain, he wakes up and goes back to sleep but he wakes up frequently enough to sleep is fragmented. He rises at the morning at about 3 AM to  go to his early shift. He feels that he gets his best sleep just before he has to rise at 3 AM. For this reason he will shift to the 8 to 8 shift only. Currently he rises at 6:50 AM and off this way gets about 5-6 hours of sleep for sure. Most mornings he will be refreshed and restored when waking up. He does not wake with headaches, nausea dizziness chest pain or shortness of breath.   Sleep medical history and family sleep history:  One sister, father wears CPAP. No history of sleep walking or night terrors.    Social history: newlywed, lives with spouse, patient has grown children from a previous marriage.   REVIEW OF SYSTEMS: Out of a complete 14 system review of symptoms, the patient complains only of the following symptoms, shortness of breath, insomnia and all other reviewed systems are negative.  ESS: now 1-2/24, previously 11/24   ALLERGIES: No Known Allergies  HOME MEDICATIONS: Outpatient Medications Prior to Visit  Medication Sig Dispense Refill   aspirin 81 MG chewable tablet Chew 81 mg by mouth daily.      atorvastatin (LIPITOR) 40 MG tablet TAKE 1 TABLET BY MOUTH EVERY DAY 90 tablet 3   busPIRone (BUSPAR) 10 MG tablet Take 1 tablet (10 mg total) by mouth at bedtime. 90 tablet 3   celecoxib (CELEBREX) 200 MG capsule Take 200 mg by mouth daily.     escitalopram (LEXAPRO) 10 MG tablet Take 10 mg by mouth daily.      Evolocumab (REPATHA SURECLICK) 062 MG/ML SOAJ Inject 1 Pen into the skin every 14 (fourteen) days. 6 mL  3   ipratropium (ATROVENT) 0.06 % nasal spray 1-2 sprays in each nostril     metoprolol succinate (TOPROL-XL) 25 MG 24 hr tablet Take 25 mg by mouth every evening.     Multiple Vitamin (MULTIVITAMIN WITH MINERALS) TABS tablet Take 1 tablet by mouth daily with lunch.     nitroGLYCERIN (NITROSTAT) 0.4 MG SL tablet PLACE 1 TABLET (0.4 MG TOTAL) UNDER THE TONGUE EVERY 5 (FIVE) MINUTES AS NEEDED FOR CHEST PAIN (UP TO 3 DOSES. IF CHEST PAIN PERSISTENT AFTER THE 3RD DOSE, CALL 911). (Patient taking differently: Place 0.4 mg under the tongue as needed for chest pain (Up to 3 doses. If chest pain persistent after the 3rd dose, call 911).) 25 tablet 3   pantoprazole (PROTONIX) 40 MG tablet Take 40 mg by mouth 2 (two) times daily.     tamsulosin (FLOMAX) 0.4 MG CAPS capsule Take 0.4 mg by mouth every evening.      No facility-administered medications prior to visit.    PAST MEDICAL HISTORY: Past Medical History:  Diagnosis Date   AAA (abdominal aortic aneurysm) ([place]) 09/2018   3.4cm   Allergy    Anxiety    Arthritis    Colitis    Coronary artery disease 01/2005   stent LAD in 2006   COVID-19 01/2020   Emphysema lung (HCC)    GERD (gastroesophageal reflux disease)    HTN (hypertension)    Hypercholesteremia    Myocardial infarction ([place])    Sleep apnea    uses cpap   Snoring    Substance abuse ([place])    Umbilical hernia    Ventral hernia     PAST SURGICAL HISTORY: Past Surgical History:  Procedure Laterality Date   2006     APPENDECTOMY     bilateral ankle fractures     CARPAL TUNNEL RELEASE  COLONOSCOPY     CORONARY ANGIOGRAPHY N/A 09/06/2019   Procedure: CORONARY ANGIOGRAPHY;  Surgeon: SwazilandJordan, Peter M, MD;  [facility];  Service: Cardiovascular;  Laterality: N/A;   CORONARY CTO INTERVENTION N/A 09/06/2019   Procedure: CORONARY CTO INTERVENTION;  Surgeon: SwazilandJordan, Peter M, MD;  [facility];  Service: Cardiovascular;  Laterality: N/A;  RCA   ELBOW  SURGERY Right    EXTERNAL EAR SURGERY     INTRAVASCULAR ULTRASOUND/IVUS N/A 09/06/2019   Procedure: Intravascular Ultrasound/IVUS;  Surgeon: SwazilandJordan, Peter M, MD;  [facility];  Service: Cardiovascular;  Laterality: N/A;   NECK SURGERY     RIGHT/LEFT HEART CATH AND CORONARY ANGIOGRAPHY N/A 09/01/2019   Procedure: RIGHT/LEFT HEART CATH AND CORONARY ANGIOGRAPHY;  Surgeon: SwazilandJordan, Peter M, MD;  [facility];  Service: Cardiovascular;  Laterality: N/A;   UPPER GASTROINTESTINAL ENDOSCOPY      FAMILY HISTORY: Family History  Problem Relation Age of Onset   COPD Father    Stomach cancer Neg Hx    Rectal cancer Neg Hx    Esophageal cancer Neg Hx    Colon cancer Neg Hx    Colon polyps Neg Hx     SOCIAL HISTORY: Social History   Socioeconomic History   Marital status: Married    Spouse name: Not on file   Number of children: 3   Years of education: HS   Highest education level: Not on file  Occupational History   Occupation: retired     Associate Professormployer: [employer]  Tobacco Use   Smoking status: Former    Packs/day: 1.50    Years: 33.00    Total pack years: 49.50    Types: Cigarettes    Quit date: 06/04/2019    Years since quitting: 2.4   Smokeless tobacco: Former  Building services engineerVaping Use   Vaping Use: Former  Substance and Sexual Activity   Alcohol use: Never   Drug use: No   Sexual activity: Yes    Birth control/protection: None  Other Topics Concern   Not on file  Social History Narrative   Lives at home with his wife.   Right-handed.   No caffeine use.   Social Determinants of Health   Financial Resource Strain: Not on file  Food Insecurity: Not on file  Transportation Needs: Not on file  Physical Activity: Not on file  Stress: Not on file  Social Connections: Not on file  Intimate Partner Violence: Not on file     PHYSICAL EXAM  Vitals:   11/24/21 1304  BP: 116/86  Pulse: 81  Weight: 181 lb (82.1 kg)  Height: 5\' 6"  (1.676 m)     Body mass index is 29.21 kg/m.  Generalized: Well developed, in no acute distress  Cardiology: normal rate and rhythm, no murmur noted Respiratory: clear to auscultation bilaterally  Neurological examination  Mentation: Alert oriented to time, place, history taking. Follows all commands speech and language fluent Cranial nerve II-XII: Pupils were equal round reactive to light. Extraocular movements were full, visual field were full  Motor: The motor testing reveals 5 over 5 strength of all 4 extremities. Good symmetric motor tone is noted throughout.  Gait and station: Gait is normal.    DIAGNOSTIC DATA (LABS, IMAGING, TESTING) - I reviewed patient records, labs, notes, testing and imaging myself where available.      No data to display           Lab Results  Component Value Date   WBC 7.6 09/07/2019   HGB 13.4 09/07/2019   HCT 40.5 09/07/2019   MCV 90.0 09/07/2019   PLT 205 09/07/2019      Component Value Date/Time   NA 139 09/07/2019 0418   NA 143 08/28/2019 1123   K 4.3 09/07/2019 0418   CL 108 09/07/2019 0418   CO2 23 09/07/2019 0418   GLUCOSE 110 (H) 09/07/2019 0418   BUN 19 09/07/2019 0418   BUN 13 08/28/2019 1123   CREATININE 1.44 (H) 09/07/2019 0418   CALCIUM 9.0 09/07/2019 0418   PROT 6.7 04/07/2021 1051   ALBUMIN 4.4 04/07/2021 1051   AST 22 04/07/2021 1051   ALT 17 04/07/2021 1051   ALKPHOS 98 04/07/2021 1051   BILITOT 0.8 04/07/2021 1051   GFRNONAA 52 (L) 09/07/2019 0418   GFRAA 60 (L) 09/07/2019 0418   Lab Results  Component Value Date   CHOL 79 (L) 04/07/2021   HDL 51 04/07/2021   LDLCALC 8 04/07/2021   TRIG 112 04/07/2021   CHOLHDL 1.5 04/07/2021   No results found for: "HGBA1C" No results found for: "VITAMINB12" No results found for: "TSH"   ASSESSMENT AND PLAN 66 y.o. year old male  has a past medical history of AAA (abdominal aortic aneurysm) (HCC) (09/2018), Allergy, Anxiety, Arthritis, Colitis, Coronary artery disease  (01/2005), COVID-19 (01/2020), Emphysema lung (HCC), GERD (gastroesophageal reflux disease), HTN (hypertension), Hypercholesteremia, Myocardial infarction [facility]), Sleep apnea, Snoring, Substance abuse (HCC), Umbilical hernia, and Ventral hernia. here with     ICD-10-CM   1. Complex sleep apnea syndrome  G47.31 For home use only DME continuous positive airway pressure (CPAP)    Home sleep test    2. Obstructive sleep apnea treated with continuous positive airway pressure (CPAP)  G47.33 For home use only DME continuous positive airway pressure (CPAP)    Home sleep test       Jason Munoz has done well with CPAP therapy at home. Review of compliance report shows excellent daily compliance and suboptimal 4-hour compliance. He reports having to move form his room to the guest room due to the dog snoring. He does not feel there is another option for the dog. His machine is approaching 30 years old. I will order a repeat sleep study then anticipate getting him a new CPAP machine. He could use old machine in guest room for nights that he is unable to sleep in his bed. He was encouraged to continue close follow-up with cardiology, pulmonology and primary care.  I have encouraged him to resume CPAP therapy daily and focus on meeting 4-hour compliance every day.  Healthy lifestyle habits encouraged.  He verbalizes understanding and agreement with this plan.   Orders Placed This Encounter  Procedures   For home use only DME continuous positive airway pressure (CPAP)    Supplies    Order Specific Question:   Length of Need    Answer:   Lifetime    Order Specific Question:   Patient has OSA or probable OSA    Answer:   Yes    Order Specific Question:   Is the patient currently using CPAP in the home    Answer:   Yes    Order Specific Question:   Settings    Answer:   Other see comments    Order Specific Question:   CPAP supplies needed    Answer:   Mask, headgear, cushions, filters, heated tubing and  water chamber   Home sleep  test    Standing Status:   Future    Standing Expiration Date:   11/25/2022    Order Specific Question:   Where should this test be performed:    Answer:   [facility]     No orders of the defined types were placed in this encounter.     Shawnie Dapper, FNP-C 11/24/2021, 1:58 PM [facility] [address], [address] [place], [place] [address] [phone]

## 2021-11-19 NOTE — Patient Instructions (Addendum)
Please continue using your CPAP regularly. While your insurance requires that you use CPAP at least 4 hours each night on 70% of the nights, I recommend, that you not skip any nights and use it throughout the night if you can. Getting used to CPAP and staying with the treatment long term does take time and patience and discipline. Untreated obstructive sleep apnea when it is moderate to severe can have an adverse impact on cardiovascular health and raise her risk for heart disease, arrhythmias, hypertension, congestive heart failure, stroke and diabetes. Untreated obstructive sleep apnea causes sleep disruption, nonrestorative sleep, and sleep deprivation. This can have an impact on your day to day functioning and cause daytime sleepiness and impairment of cognitive function, memory loss, mood disturbance, and problems focussing. Using CPAP regularly can improve these symptoms.  We will order a HST to evaluate for sleep apnea. Once data is reviewed, we will order a new CPAP machine.   Try Biotene products OTC for dry mouth.  Follow up in 31-90 days after getting your new CPAP machine.

## 2021-11-24 ENCOUNTER — Encounter: Payer: Self-pay | Admitting: Family Medicine

## 2021-11-24 ENCOUNTER — Ambulatory Visit: Payer: 59 | Admitting: Family Medicine

## 2021-11-24 VITALS — BP 116/86 | HR 81 | Ht 66.0 in | Wt 181.0 lb

## 2021-11-24 DIAGNOSIS — G4733 Obstructive sleep apnea (adult) (pediatric): Secondary | ICD-10-CM | POA: Diagnosis not present

## 2021-11-24 DIAGNOSIS — G4731 Primary central sleep apnea: Secondary | ICD-10-CM | POA: Diagnosis not present

## 2021-11-25 ENCOUNTER — Telehealth: Payer: Self-pay

## 2021-12-01 ENCOUNTER — Telehealth: Payer: Self-pay | Admitting: Family Medicine

## 2021-12-01 ENCOUNTER — Ambulatory Visit: Payer: 59 | Admitting: Gastroenterology

## 2021-12-01 ENCOUNTER — Other Ambulatory Visit: Payer: 59

## 2021-12-01 ENCOUNTER — Encounter: Payer: Self-pay | Admitting: Gastroenterology

## 2021-12-01 VITALS — BP 112/86 | HR 65 | Ht 66.0 in | Wt 178.6 lb

## 2021-12-01 DIAGNOSIS — K529 Noninfective gastroenteritis and colitis, unspecified: Secondary | ICD-10-CM | POA: Diagnosis not present

## 2021-12-01 NOTE — Telephone Encounter (Signed)
HST- [employer] no auth req.  Patient is scheduled at [facility] for 12/23/21 at 11 AM.  Mailed packet to the patient.

## 2021-12-01 NOTE — Progress Notes (Signed)
12/01/2021 [name] [id number] 04-12-1955   HISTORY OF PRESENT ILLNESS: This is a 66 year old male who is a patient of Dr. Lamar Sprinkles.  He is here today with complaints of diarrhea and episodes of abdominal pain as well as large amounts of flatulence.  He tells me that he pretty much has diarrhea all the time.  He sometimes is woken up in the night with knifelike abdominal pain that is followed by diarrhea.  He has urgency with episodes of incontinence.  This has been occurring for some years, at least dating back to 2020.  He had colonoscopy and EGD as below.  He has had CT scans without any cause identified.  H. pylori breath test is negative.  HIDA scan has been negative.  Reports that he eats out a lot.   EGD 09/14/2018 by Dr. Marina Goodell: - The esophagus was normal. - The stomach was normal. - The examined duodenum was normal. - The cardia and gastric fundus were normal on retroflexion.   Colonoscopy 09/14/2018 by Dr. Marina Goodell: - The examined portion of the ileum was normal. - Diverticulosis in the sigmoid colon. Internal hemorrhoids. - The examination was otherwise normal on direct and retroflexion views. - No specimens collected. -Recall colonoscopy 10 years.   Past Medical History:  Diagnosis Date   AAA (abdominal aortic aneurysm) (HCC) 09/2018   3.4cm   Allergy    Anxiety    Arthritis    Colitis    Coronary artery disease 01/2005   stent LAD in 2006   COVID-19 01/2020   Emphysema lung (HCC)    GERD (gastroesophageal reflux disease)    HTN (hypertension)    Hypercholesteremia    Myocardial infarction (HCC)    Sleep apnea    uses cpap   Snoring    Substance abuse (HCC)    Umbilical hernia    Ventral hernia    Past Surgical History:  Procedure Laterality Date   2006     APPENDECTOMY     bilateral ankle fractures     CARPAL TUNNEL RELEASE     COLONOSCOPY     CORONARY ANGIOGRAPHY N/A 09/06/2019   Procedure: CORONARY ANGIOGRAPHY;  Surgeon: Swaziland, Peter M, MD;   Location: [facility] INVASIVE CV LAB;  Service: Cardiovascular;  Laterality: N/A;   CORONARY CTO INTERVENTION N/A 09/06/2019   Procedure: CORONARY CTO INTERVENTION;  Surgeon: Swaziland, Peter M, MD;  Location: [facility] INVASIVE CV LAB;  Service: Cardiovascular;  Laterality: N/A;  RCA   ELBOW SURGERY Right    EXTERNAL EAR SURGERY     INTRAVASCULAR ULTRASOUND/IVUS N/A 09/06/2019   Procedure: Intravascular Ultrasound/IVUS;  Surgeon: Swaziland, Peter M, MD;  Location: [facility] INVASIVE CV LAB;  Service: Cardiovascular;  Laterality: N/A;   NECK SURGERY     RIGHT/LEFT HEART CATH AND CORONARY ANGIOGRAPHY N/A 09/01/2019   Procedure: RIGHT/LEFT HEART CATH AND CORONARY ANGIOGRAPHY;  Surgeon: Swaziland, Peter M, MD;  Location: [facility] INVASIVE CV LAB;  Service: Cardiovascular;  Laterality: N/A;   UPPER GASTROINTESTINAL ENDOSCOPY      reports that he quit smoking about 2 years ago. His smoking use included cigarettes. He has a 49.50 pack-year smoking history. He has quit using smokeless tobacco. He reports that he does not drink alcohol and does not use drugs. family history includes COPD in his father. No Known Allergies    Outpatient Encounter Medications as of 12/01/2021  Medication Sig   aspirin 81 MG chewable tablet Chew 81 mg by mouth daily.    atorvastatin (LIPITOR)  40 MG tablet TAKE 1 TABLET BY MOUTH EVERY DAY   busPIRone (BUSPAR) 10 MG tablet Take 1 tablet (10 mg total) by mouth at bedtime.   celecoxib (CELEBREX) 200 MG capsule Take 200 mg by mouth daily.   escitalopram (LEXAPRO) 10 MG tablet Take 10 mg by mouth daily.    Evolocumab (REPATHA SURECLICK) 007 MG/ML SOAJ Inject 1 Pen into the skin every 14 (fourteen) days.   ipratropium (ATROVENT) 0.06 % nasal spray 1-2 sprays in each nostril   metoprolol succinate (TOPROL-XL) 25 MG 24 hr tablet Take 25 mg by mouth every evening.   Multiple Vitamin (MULTIVITAMIN WITH MINERALS) TABS tablet Take 1 tablet by mouth daily with lunch.   nitroGLYCERIN (NITROSTAT) 0.4 MG SL tablet PLACE 1  TABLET (0.4 MG TOTAL) UNDER THE TONGUE EVERY 5 (FIVE) MINUTES AS NEEDED FOR CHEST PAIN (UP TO 3 DOSES. IF CHEST PAIN PERSISTENT AFTER THE 3RD DOSE, CALL 911). (Patient taking differently: Place 0.4 mg under the tongue as needed for chest pain (Up to 3 doses. If chest pain persistent after the 3rd dose, call 911).)   pantoprazole (PROTONIX) 40 MG tablet Take 40 mg by mouth 2 (two) times daily.   tamsulosin (FLOMAX) 0.4 MG CAPS capsule Take 0.4 mg by mouth every evening.    No facility-administered encounter medications on file as of 12/01/2021.     REVIEW OF SYSTEMS  : All other systems reviewed and negative except where noted in the History of Present Illness.   PHYSICAL EXAM: BP 112/86   Pulse 65   Ht 5\' 6"  (1.676 m)   Wt 178 lb 9.6 oz (81 kg)   SpO2 95%   BMI 28.83 kg/m  General: Well developed white male in no acute distress Head: Normocephalic and atraumatic Eyes:  Sclerae anicteric, conjunctiva pink. Ears: Normal auditory acuity Lungs: Clear throughout to auscultation; no W/R/R. Heart: Regular rate and rhythm; no M/R/G. Abdomen: Soft, non-distended.  BS present and very hyperactive.  Non-tender. Musculoskeletal: Symmetrical with no gross deformities  Skin: No lesions on visible extremities Extremities: No edema  Neurological: Alert oriented x 4, grossly non-focal Psychological:  Alert and cooperative. Normal mood and affect  ASSESSMENT AND PLAN: *Chronic diarrhea often with nocturnal stools that begins with abdominal pain and a lot of flatulence.  Has had CT scan, colonoscopy, endoscopy although biopsies not performed.  This is a longstanding.  Could be dietary.  We discussed a lactose-free diet although he does not believe that he would be compliant with that.  We will check celiac labs.  We will check a pancreatic fecal elastase to rule out EPI.  In the interim he can start with Imodium 1/2 to 1 tablet every morning and can titrate up as needed.  Could also consider repeat at  least flexible sigmoidoscopy for random colon biopsies for microscopic colitis versus SIBO testing or empiric treatment with a course of Xifaxan.  ?  Use of Lomotil, Questran, or Viberzi.  I do not think this is medication related.  CC:  Burnard Bunting, MD

## 2021-12-01 NOTE — Progress Notes (Signed)
Noted  

## 2021-12-01 NOTE — Patient Instructions (Addendum)
Your provider has requested that you go to the basement level for lab work before leaving today. Press "B" on the elevator. The lab is located at the first door on the left as you exit the elevator.  Imodium daily, may triturating up as needed. _______________________________________________________  If you are age 64 or older, your body mass index should be between 23-30. Your Body mass index is 28.83 kg/m. If this is out of the aforementioned range listed, please consider follow up with your Primary Care Provider.  If you are age 72 or younger, your body mass index should be between 19-25. Your Body mass index is 28.83 kg/m. If this is out of the aformentioned range listed, please consider follow up with your Primary Care Provider.   ________________________________________________________  The [facility] providers would like to encourage you to use [facility] to communicate with providers for non-urgent requests or questions.  Due to long hold times on the telephone, sending your provider a message by [facility] may be a faster and more efficient way to get a response.  Please allow 48 business hours for a response.  Please remember that this is for non-urgent requests.  _______________________________________________________

## 2021-12-02 LAB — TISSUE TRANSGLUTAMINASE ABS,IGG,IGA
(tTG) Ab, IgA: <1 U/mL
(tTG) Ab, IgG: <1 U/mL

## 2021-12-02 LAB — IGA: Immunoglobulin A: 175 mg/dL (ref 70–320)

## 2021-12-11 ENCOUNTER — Ambulatory Visit (INDEPENDENT_AMBULATORY_CARE_PROVIDER_SITE_OTHER): Payer: 59

## 2021-12-11 DIAGNOSIS — F17200 Nicotine dependence, unspecified, uncomplicated: Secondary | ICD-10-CM | POA: Diagnosis not present

## 2021-12-11 DIAGNOSIS — Z87891 Personal history of nicotine dependence: Secondary | ICD-10-CM

## 2021-12-11 DIAGNOSIS — Z122 Encounter for screening for malignant neoplasm of respiratory organs: Secondary | ICD-10-CM

## 2021-12-15 ENCOUNTER — Other Ambulatory Visit: Payer: Self-pay | Admitting: Acute Care

## 2021-12-15 DIAGNOSIS — Z122 Encounter for screening for malignant neoplasm of respiratory organs: Secondary | ICD-10-CM

## 2021-12-15 DIAGNOSIS — Z87891 Personal history of nicotine dependence: Secondary | ICD-10-CM

## 2021-12-15 DIAGNOSIS — F17210 Nicotine dependence, cigarettes, uncomplicated: Secondary | ICD-10-CM

## 2021-12-23 ENCOUNTER — Ambulatory Visit: Payer: 59 | Admitting: Neurology

## 2021-12-23 DIAGNOSIS — K219 Gastro-esophageal reflux disease without esophagitis: Secondary | ICD-10-CM

## 2021-12-23 DIAGNOSIS — I2510 Atherosclerotic heart disease of native coronary artery without angina pectoris: Secondary | ICD-10-CM

## 2021-12-23 DIAGNOSIS — J209 Acute bronchitis, unspecified: Secondary | ICD-10-CM

## 2021-12-23 DIAGNOSIS — G4733 Obstructive sleep apnea (adult) (pediatric): Secondary | ICD-10-CM | POA: Diagnosis not present

## 2021-12-23 DIAGNOSIS — G4731 Primary central sleep apnea: Secondary | ICD-10-CM

## 2021-12-25 NOTE — Progress Notes (Signed)
[facility] : Jason Munoz   HOME SLEEP TEST REPORT ( by Watch PAT)   STUDY DATE:  12-25-2021    ORDERING CLINICIAN: Shawnie Dapper, NP  REFERRING CLINICIAN: Cammy Copa, MD    CLINICAL INFORMATION/HISTORY: CPAP user with good resolution of complex sleep apnea on CPAP ( set at 14 cm water, set up in 2018- needs replaced) 11/24/21 ALL:  Jason Munoz returns for follow up for complex (mostly obstructive) sleep apnea on CPAP. He was last seen 03/2020 and having difficulty meeting compliance. We started buspirone 10mg  30 minutes before bedtime and adjusted settings following titration study. Since, he reports doing well. He is no longer using buspirone. He is able to use CPAP nightly. He reports that his wife usually asks him to move to the guest room with their dog in the middle of the night due to the dog snoring. He states that he is usually able to use CPAP for 2-3 hours before having to switch rooms. He also uses CPAP with daytime naps. He enjoys about an hour nap every day after work. He denies EDS.     Epworth sleepiness score: 2 /24. Pre CPAP 11/24   BMI:  29 kg/m   Neck Circumference: na   FINDINGS:   Sleep Summary:   Total Recording Time (hours, min): 10 hours and 1 minutes       Total Sleep Time (hours, min): 8 hours 21 minutes               Percent REM (%):   9.7%     This home sleep test device calculated a very long REM sleep latency of 196 minutes while sleep latency was only 23 minutes in duration.                                  Respiratory Indices:   Calculated pAHI (per hour):   7.4/h                          REM pAHI:    6.2/h                                             NREM pAHI:   7.5/h                           Positional AHI:   All sleep was in nonsupine, including left and right-sided sleep.  Left-sided sleep was associated with an AHI of 8.1/h.  There was no prone sleep captured. It appears that the body position was only part of the  recording after 3:30 AM, as there are no data for body position prior.  Snoring was recorded with a mean volume of 41 dB, which is mild.  Only 13% of total sleep time were associated with snoring                                                 Oxygen Saturation Statistics:   Oxygen Saturation (%) Mean:     94%  Minimum oxygen saturation (%):    Nadir at 79%        O2 Saturation Range (%): Between 79 and 99%                                     O2 Saturation (minutes) <89%:    0.1-minute       Pulse Rate Statistics:   Pulse Mean (bpm): 56 bpm, please note that this home sleep test device cannot give cardiac rhythm data.               Pulse Range:   Between 37 bpm and 91 bpm, with a single episode of bradycardia              IMPRESSION:  This HST confirms the presence of very mild sleep apnea, presumed to be mixed or obstructive. I would like to continue CPAP treatment, as the patient has been used to CPAP therapy.   RECOMMENDATION: An auto titration CPAP device will be set up with heated humidification, interface of patient's choice, and a setting between 6 and 16 cm water pressure with 2 cm EPR. [facility] prefers a [employer] to be [employer] of the CPAP device, either [employer] or [employer].    INTERPRETING PHYSICIAN:   Melvyn Novas, MD   Medical Director of [facility] at [facility]

## 2022-01-05 ENCOUNTER — Other Ambulatory Visit: Payer: Self-pay | Admitting: Family Medicine

## 2022-01-05 DIAGNOSIS — G4731 Primary central sleep apnea: Secondary | ICD-10-CM

## 2022-01-05 DIAGNOSIS — G4733 Obstructive sleep apnea (adult) (pediatric): Secondary | ICD-10-CM

## 2022-01-05 NOTE — Procedures (Signed)
[facility] : [name]   HOME SLEEP TEST REPORT ( by Watch PAT)   STUDY DATE:  12-25-2021    ORDERING CLINICIAN: Shawnie Dapper, NP  REFERRING CLINICIAN: Cammy Copa, MD    CLINICAL INFORMATION/HISTORY: CPAP user with good resolution of complex sleep apnea on CPAP ( set at 14 cm water, set up in 2018- needs replaced) 11/24/21 ALL:  Jason Munoz returns for follow up for complex (mostly obstructive) sleep apnea on CPAP. He was last seen 03/2020 and having difficulty meeting compliance. We started buspirone 10mg  30 minutes before bedtime and adjusted settings following titration study. Since, he reports doing well. He is no longer using buspirone. He is able to use CPAP nightly. He reports that his wife usually asks him to move to the guest room with their dog in the middle of the night due to the dog snoring. He states that he is usually able to use CPAP for 2-3 hours before having to switch rooms. He also uses CPAP with daytime naps. He enjoys about an hour nap every day after work. He denies EDS.     Epworth sleepiness score: 2 /24. Pre CPAP 11/24   BMI:  29 kg/m   Neck Circumference: na   FINDINGS:   Sleep Summary:   Total Recording Time (hours, min): 10 hours and 1 minutes       Total Sleep Time (hours, min): 8 hours 21 minutes               Percent REM (%):   9.7%     This home sleep test device calculated a very long REM sleep latency of 196 minutes while sleep latency was only 23 minutes in duration.                                  Respiratory Indices:   Calculated pAHI (per hour):   7.4/h                          REM pAHI:    6.2/h                                             NREM pAHI:   7.5/h                           Positional AHI:   All sleep was in nonsupine, including left and right-sided sleep.  Left-sided sleep was associated with an AHI of 8.1/h.  There was no prone sleep captured. It appears that the body position was only part of the  recording after 3:30 AM, as there are no data for body position prior.  Snoring was recorded with a mean volume of 41 dB, which is mild.  Only 13% of total sleep time were associated with snoring                                                 Oxygen Saturation Statistics:   Oxygen Saturation (%) Mean:     94%  Minimum oxygen saturation (%):    Nadir at 79%        O2 Saturation Range (%): Between 79 and 99%                                     O2 Saturation (minutes) <89%:    0.1-minute       Pulse Rate Statistics:   Pulse Mean (bpm): 56 bpm, please note that this home sleep test device cannot provide cardiac rhythm data, only the heart rate is reflected here.               Pulse Range:   Between 37 bpm and 91 bpm, with a single episode of bradycardia              IMPRESSION:  This HST confirms the presence of very mild sleep apnea, presumed to be mixed or obstructive. I would like to continue CPAP treatment, as the patient has been used to CPAP therapy.   RECOMMENDATION: An auto titration CPAP device will be set up with heated humidification, interface of patient's choice, and a setting between 6 and 16 cm water pressure with 2 cm EPR. [facility] prefers a [employer] to be [employer] of the CPAP device, either [employer]	[place] or [employer].    INTERPRETING PHYSICIAN:   Larey Seat, MD   Medical Director of [facility] at [facility].

## 2022-01-06 ENCOUNTER — Telehealth: Payer: Self-pay

## 2022-04-06 ENCOUNTER — Other Ambulatory Visit: Payer: Self-pay | Admitting: Cardiology

## 2022-04-09 NOTE — Progress Notes (Signed)
PATIENT: Jason Munoz DOB: [birth year]  REASON FOR VISIT: follow up HISTORY FROM: patient  Chief Complaint  Patient presents with   Follow-up    RM 2, alone. Last seen 11/24/21. Doing well on new CPAP. Had a bad sinus infection 2-3 wk ago and stopped CPAP use during this.      HISTORY OF PRESENT ILLNESS:  04/13/22 ALL:  [name] returns for follow up for complex sleep apnea. He repeated HST 12/2021 that confirmed mild sleep apnea. New autoPAP orders were placed. He reports doing well on new machine. He is using therapy nightly. He has had trouble meeting 4 hour compliance over the past few weeks due to allergy/sinus symptoms. He is doing better, now. He denies concerns with machine or supplies.     11/24/2021 ALL: [name] returns for follow up for complex (mostly obstructive) sleep apnea on CPAP. He was last seen 03/2020 and having difficulty meeting compliance. We started buspirone '10mg'$  30 minutes before bedtime and adjusted settings following titration study. Since, he reports doing well. He is no longer using buspirone. He is able to use CPAP nightly. He reports that his wife usually asks him to move to the guest room with their dog in the middle of the night due to the dog snoring. He states that he is usually able to use CPAP for 2-3 hours before having to switch rooms. He also uses CPAP with daytime naps. He enjoys about an hour nap every day after work. He denies EDS. He is able to stay awake if needed but enjoys lying down after work. He denies concerns with CPAP or supplies. He does get dry mouth on occasion. He is able to drink a sip of water and returns to sleep. He reports CPAP machine was set up late 2018.     03/26/2020 ALL: He returns for complex sleep apnea on CPAP compliance review. CPAP pressure changed from 11cmH20 to 13cmH20 at last visit with me in 12/2019. Review of download 02/13/2020 showed AHI remained elevated despite change and titration study ordered. He had  titration study 03/21/2020 with Dr Brett Fairy. Study is currently in process of being read. Since last visit, he has continued to struggle with shortness of breath. Cardiology and pulmonology continue close follow up but feel symptoms are related to unmanaged sleep apnea. He also suffered from positive Covid 19 infection at the beginning of 2022. He is feeling better now. He continues to struggle with daily and four hour compliance. He has been better about using CPAP daily but feels he has a hard time keeping it on all night. When he naps during the day (usually once a day) he can use CPAP without any problems. When he uses CPAP at night, he feels that he wrestles with the tubing and becomes irritated. He is using melatonin nightly to see if it will help with anxiety but has not noted any benefit. He sometimes takes 10-'30mg'$  a night.   Compliance report dated 12/20/2019 through 03/18/2020 reveals that he used CPAP 79 of the past 90 days for compliance of 88%.  He used CPAP greater than 4 hours 26 of the past 90 days for compliance of 29%.  Average usage on days used was 3 hours and 4 minutes.  Residual AHI remains elevated at 26.1 events per hour with 22 central events and 3.5 obstructive.  He is on a set pressure of 13 cm of water, increased from 11 cm at last visit in November.  There is no  significant leak.  12/21/2019 ALL:  Jason Munoz is a 67 y.o. male here today for follow up for OSA on CPAP. He admits that he has not been very compliant with CPAP therapy. He has had two cardiac cath procedures with stents placed since last being seen. He reports that he continues to have shob. He is followed by pulmonology.  He admits that he has not used CPAP consistently.  He has had some health concerns that have prevented use.  He continues to snore.  He has a hard time going to sleep.  Compliance report dated 11/20/2019 through 12/19/2019 reveals that he used CPAP 15 of the past 30 days for compliance of 50%.  He used  CPAP for greater than 4 hours 6 of the past 30 days for compliance of 20%.  Average usage on days used was 3 hours and 31 minutes.  Residual AHI was 14.4 on a set pressure of 11 cm of water and EPR of 3.  There was no significant leak noted.  Of note, he had 10.6 central events, 3.1 obstructive events.  HISTORY: (copied from Dr Dohmeier's note on 02/13/2019)  02-13-2019-  Jason Munoz is a 67 y.o. male , was seen for multiple Rv by Deberah Pelton. He had undergone a sleep study on 11-20-2016 and diagnosed with mostly obstructive , complex sleep apnea at [facility] of only 7.9, RDI 9.3/h -  REM AHI 16.5/h. 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  I reviewed the patient's download and also he has used the machine on every also last 30 days he has not always been able to use it for longer than 2 hours, the average user time is 2 hours and 54 minutes on the, he uses CPAP with a set pressure of 11 cmH2O and 3 cm EPR, his residual AHI is 19.3 almost double the apnea count he had a baseline, and these are central dominant apneas.  My suspicion is that the patient has treatment emergent central apnea and needs to be titrated to a BiPAP, air leakage seems not to be the problem. He is using a nasal pillow, which he likes.  In summer he broke 3 ribs , had to sleep in a recliner, for 3 weeks in september he went out of town without CPAP, has abdominal pain, and was less and less compliant.  He went with his wife to [place] before christmas.    2018- seen here as in a referral/ revisit  from Dr. Reynaldo Minium for sleep consultation. [name] reports tinnitus, fatigue, snoring, erectile dysfunction and a history of high cholesterol. He has also been treated for GERD. Hypertension has been treated with Toprol and Cozaar. He takes a baby aspirin daily. He smokes one pack per day drinks socially alcohol but does not use nonprescription drugs or caffeine. 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 His 12 hour shift will begin at 4 AM and an's at 4:30 PM, other days he will start at 8 AM to 8 PM. He still feels that he gets nighttime sleep and rest.   Sleep habits are as follows:  Usually the patient will come home after work to eat supper with his spouse, he may do some gardening or lawnmowing. And evening as usually spend watching TV. The patient had neck surgery, his wife hip surgery and there not participating in a regular exercise regimen at this time. Bedtime is usually around 10:30  during the week. The patient reports falling asleep rather promptly, he sleeps on his side, on one pillow. The bedroom is quiet, cool and dark with a ceiling fan. He has up to 3 bathroom breaks each night to go to the bathroom and he wakes up frequently for other reasons 2. He has hip pain, he wakes up and goes back to sleep but he wakes up frequently enough to sleep is fragmented. He rises at the morning at about 3 AM to go to his early shift. He feels that he gets his best sleep just before he has to rise at 3 AM. For this reason he will shift to the 8 to 8 shift only. Currently he rises at 6:50 AM and off this way gets about 5-6 hours of sleep for sure. Most mornings he will be refreshed and restored when waking up. He does not wake with headaches, nausea dizziness chest pain or shortness of breath.   Sleep medical history and family sleep history:  One sister, father wears CPAP. No history of sleep walking or night terrors.    Social history: newlywed, lives with spouse, patient has grown children from a previous marriage.   REVIEW OF SYSTEMS: Out of a complete 14 system review of symptoms, the patient complains only of the following symptoms, shortness of breath, insomnia and all other reviewed systems are negative.  ESS: 10/24, previously 11/24   ALLERGIES: No Known Allergies  HOME MEDICATIONS: Outpatient Medications Prior to Visit  Medication Sig Dispense Refill   aspirin 81 MG  chewable tablet Chew 81 mg by mouth daily.      atorvastatin (LIPITOR) 40 MG tablet TAKE 1 TABLET BY MOUTH EVERY DAY 90 tablet 1   busPIRone (BUSPAR) 10 MG tablet Take 1 tablet (10 mg total) by mouth at bedtime. 90 tablet 3   celecoxib (CELEBREX) 200 MG capsule Take 200 mg by mouth daily.     escitalopram (LEXAPRO) 10 MG tablet Take 10 mg by mouth daily.      Evolocumab (REPATHA SURECLICK) [id number] MG/ML SOAJ Inject 1 Pen into the skin every 14 (fourteen) days. 6 mL 3   ipratropium (ATROVENT) 0.06 % nasal spray 1-2 sprays in each nostril     metoprolol succinate (TOPROL-XL) 25 MG 24 hr tablet Take 25 mg by mouth every evening.     Multiple Vitamin (MULTIVITAMIN WITH MINERALS) TABS tablet Take 1 tablet by mouth daily with lunch.     nitroGLYCERIN (NITROSTAT) 0.4 MG SL tablet PLACE 1 TABLET (0.4 MG TOTAL) UNDER THE TONGUE EVERY 5 (FIVE) MINUTES AS NEEDED FOR CHEST PAIN (UP TO 3 DOSES. IF CHEST PAIN PERSISTENT AFTER THE 3RD DOSE, CALL 911). (Patient taking differently: Place 0.4 mg under the tongue as needed for chest pain (Up to 3 doses. If chest pain persistent after the 3rd dose, call 911).) 25 tablet 3   pantoprazole (PROTONIX) 40 MG tablet Take 40 mg by mouth 2 (two) times daily.     tamsulosin (FLOMAX) 0.4 MG CAPS capsule Take 0.4 mg by mouth every evening.      No facility-administered medications prior to visit.    PAST MEDICAL HISTORY: Past Medical History:  Diagnosis Date   AAA (abdominal aortic aneurysm) ([place]) 09/2018   3.4cm   Allergy    Anxiety    Arthritis    Colitis    Coronary artery disease 01/2005   stent LAD in 2006   COVID-19 01/2020   Emphysema lung ([facility])    GERD (gastroesophageal reflux disease)  HTN (hypertension)    Hypercholesteremia    Myocardial infarction [facility])    Sleep apnea    uses cpap   Snoring    Substance abuse ([place])    Umbilical hernia    Ventral hernia     PAST SURGICAL HISTORY: Past Surgical History:  Procedure Laterality Date   2006      APPENDECTOMY     bilateral ankle fractures     CARPAL TUNNEL RELEASE     COLONOSCOPY     CORONARY ANGIOGRAPHY N/A 09/06/2019   Procedure: CORONARY ANGIOGRAPHY;  Surgeon: Martinique, Peter M, MD;  Location: [facility];  Service: Cardiovascular;  Laterality: N/A;   CORONARY CTO INTERVENTION N/A 09/06/2019   Procedure: CORONARY CTO INTERVENTION;  Surgeon: Martinique, Peter M, MD;  Location: [facility];  Service: Cardiovascular;  Laterality: N/A;  RCA   ELBOW SURGERY Right    EXTERNAL EAR SURGERY     INTRAVASCULAR ULTRASOUND/IVUS N/A 09/06/2019   Procedure: Intravascular Ultrasound/IVUS;  Surgeon: Martinique, Peter M, MD;  Location: [facility];  Service: Cardiovascular;  Laterality: N/A;   NECK SURGERY     RIGHT/LEFT HEART CATH AND CORONARY ANGIOGRAPHY N/A 09/01/2019   Procedure: RIGHT/LEFT HEART CATH AND CORONARY ANGIOGRAPHY;  Surgeon: Martinique, Peter M, MD;  Location: [facility];  Service: Cardiovascular;  Laterality: N/A;   UPPER GASTROINTESTINAL ENDOSCOPY      FAMILY HISTORY: Family History  Problem Relation Age of Onset   COPD Father    Stomach cancer Neg Hx    Rectal cancer Neg Hx    Esophageal cancer Neg Hx    Colon cancer Neg Hx    Colon polyps Neg Hx     SOCIAL HISTORY: Social History   Socioeconomic History   Marital status: Married    Spouse name: Not on file   Number of children: 3   Years of education: HS   Highest education level: Not on file  Occupational History   Occupation: retired     Fish farm manager: [employer]  Tobacco Use   Smoking status: Former    Packs/day: 1.50    Years: 33.00    Total pack years: 49.50    Types: Cigarettes    Quit date: 06/04/2019    Years since quitting: 2.8   Smokeless tobacco: Former  Scientific laboratory technician Use: Former  Substance and Sexual Activity   Alcohol use: Never   Drug use: No   Sexual activity: Yes    Birth control/protection: None  Other Topics Concern   Not on file  Social History Narrative    Lives at home with his wife.   Right-handed.   No caffeine use.   Social Determinants of Health   Financial Resource Strain: Not on file  Food Insecurity: Not on file  Transportation Needs: Not on file  Physical Activity: Not on file  Stress: Not on file  Social Connections: Not on file  Intimate Partner Violence: Not on file     PHYSICAL EXAM  Vitals:   04/13/22 1515  BP: 120/88  Pulse: 86  Weight: 178 lb 3.2 oz (80.8 kg)  Height: '5\' 6"'$  (1.676 m)     Body mass index is 28.76 kg/m.  Generalized: Well developed, in no acute distress  Cardiology: normal rate and rhythm, no murmur noted Respiratory: clear to auscultation bilaterally  Neurological examination  Mentation: Alert oriented to time, place, history taking. Follows all commands speech and language fluent Cranial nerve II-XII: Pupils were  equal round reactive to light. Extraocular movements were full, visual field were full  Motor: The motor testing reveals 5 over 5 strength of all 4 extremities. Good symmetric motor tone is noted throughout.  Gait and station: Gait is normal.    DIAGNOSTIC DATA (LABS, IMAGING, TESTING) - I reviewed patient records, labs, notes, testing and imaging myself where available.      No data to display           Lab Results  Component Value Date   WBC 7.6 09/07/2019   HGB 13.4 09/07/2019   HCT 40.5 09/07/2019   MCV 90.0 09/07/2019   PLT 205 09/07/2019      Component Value Date/Time   NA 139 09/07/2019 0418   NA 143 08/28/2019 1123   K 4.3 09/07/2019 0418   CL 108 09/07/2019 0418   CO2 23 09/07/2019 0418   GLUCOSE 110 (H) 09/07/2019 0418   BUN 19 09/07/2019 0418   BUN 13 08/28/2019 1123   CREATININE 1.44 (H) 09/07/2019 0418   CALCIUM 9.0 09/07/2019 0418   PROT 6.7 04/07/2021 1051   ALBUMIN 4.4 04/07/2021 1051   AST 22 04/07/2021 1051   ALT 17 04/07/2021 1051   ALKPHOS 98 04/07/2021 1051   BILITOT 0.8 04/07/2021 1051   GFRNONAA 52 (L) 09/07/2019 0418   GFRAA 60  (L) 09/07/2019 0418   Lab Results  Component Value Date   CHOL 79 (L) 04/07/2021   HDL 51 04/07/2021   LDLCALC 8 04/07/2021   TRIG 112 04/07/2021   CHOLHDL 1.5 04/07/2021   No results found for: "HGBA1C" No results found for: "VITAMINB12" No results found for: "TSH"   ASSESSMENT AND PLAN 67 y.o. year old male  has a past medical history of AAA (abdominal aortic aneurysm) ([place]) (09/2018), Allergy, Anxiety, Arthritis, Colitis, Coronary artery disease (01/2005), COVID-19 (01/2020), Emphysema lung ([place]), GERD (gastroesophageal reflux disease), HTN (hypertension), Hypercholesteremia, Myocardial infarction [facility]), Sleep apnea, Snoring, Substance abuse ([place]), Umbilical hernia, and Ventral hernia. here with     ICD-10-CM   1. Complex sleep apnea syndrome  G47.31     2. Obstructive sleep apnea treated with continuous positive airway pressure (CPAP)  G47.33       [name] has done well with CPAP therapy at home. Review of compliance report shows excellent daily compliance and suboptimal 4-hour compliance. He reports having a sinus infection limiting use but is feeling better. I have advised he resume nightly usage for at least 4 hours. He was encouraged to continue close follow-up with cardiology, pulmonology and primary care.  I have encouraged him to resume CPAP therapy daily and focus on meeting 4-hour compliance every day.  Healthy lifestyle habits encouraged.  He verbalizes understanding and agreement with this plan.   No orders of the defined types were placed in this encounter.    No orders of the defined types were placed in this encounter.     Debbora Presto, FNP-C 04/13/2022, 4:07 PM [facility] [address] [place], [place] [address] [phone]

## 2022-04-09 NOTE — Patient Instructions (Addendum)

## 2022-04-13 ENCOUNTER — Ambulatory Visit: Payer: 59 | Admitting: Family Medicine

## 2022-04-13 ENCOUNTER — Encounter: Payer: Self-pay | Admitting: Family Medicine

## 2022-04-13 VITALS — BP 120/88 | HR 86 | Ht 66.0 in | Wt 178.2 lb

## 2022-04-13 DIAGNOSIS — G4733 Obstructive sleep apnea (adult) (pediatric): Secondary | ICD-10-CM | POA: Diagnosis not present

## 2022-04-13 DIAGNOSIS — G4731 Primary central sleep apnea: Secondary | ICD-10-CM | POA: Diagnosis not present

## 2022-04-13 DIAGNOSIS — G4739 Other sleep apnea: Secondary | ICD-10-CM

## 2022-04-27 DIAGNOSIS — G4733 Obstructive sleep apnea (adult) (pediatric): Secondary | ICD-10-CM | POA: Diagnosis not present

## 2022-05-12 DIAGNOSIS — M19071 Primary osteoarthritis, right ankle and foot: Secondary | ICD-10-CM | POA: Diagnosis not present

## 2022-05-19 DIAGNOSIS — M19071 Primary osteoarthritis, right ankle and foot: Secondary | ICD-10-CM | POA: Diagnosis not present

## 2022-05-19 DIAGNOSIS — M19079 Primary osteoarthritis, unspecified ankle and foot: Secondary | ICD-10-CM | POA: Diagnosis not present

## 2022-05-26 DIAGNOSIS — M25571 Pain in right ankle and joints of right foot: Secondary | ICD-10-CM | POA: Diagnosis not present

## 2022-05-26 DIAGNOSIS — M85671 Other cyst of bone, right ankle and foot: Secondary | ICD-10-CM | POA: Diagnosis not present

## 2022-05-26 DIAGNOSIS — M19079 Primary osteoarthritis, unspecified ankle and foot: Secondary | ICD-10-CM | POA: Diagnosis not present

## 2022-05-26 DIAGNOSIS — M19071 Primary osteoarthritis, right ankle and foot: Secondary | ICD-10-CM | POA: Diagnosis not present

## 2022-05-28 DIAGNOSIS — G4733 Obstructive sleep apnea (adult) (pediatric): Secondary | ICD-10-CM | POA: Diagnosis not present

## 2022-06-01 DIAGNOSIS — I739 Peripheral vascular disease, unspecified: Secondary | ICD-10-CM | POA: Diagnosis not present

## 2022-06-01 DIAGNOSIS — R69 Illness, unspecified: Secondary | ICD-10-CM | POA: Diagnosis not present

## 2022-06-01 DIAGNOSIS — Z008 Encounter for other general examination: Secondary | ICD-10-CM | POA: Diagnosis not present

## 2022-06-01 DIAGNOSIS — K219 Gastro-esophageal reflux disease without esophagitis: Secondary | ICD-10-CM | POA: Diagnosis not present

## 2022-06-01 DIAGNOSIS — N400 Enlarged prostate without lower urinary tract symptoms: Secondary | ICD-10-CM | POA: Diagnosis not present

## 2022-06-01 DIAGNOSIS — E785 Hyperlipidemia, unspecified: Secondary | ICD-10-CM | POA: Diagnosis not present

## 2022-06-01 DIAGNOSIS — G4733 Obstructive sleep apnea (adult) (pediatric): Secondary | ICD-10-CM | POA: Diagnosis not present

## 2022-06-01 DIAGNOSIS — M1990 Unspecified osteoarthritis, unspecified site: Secondary | ICD-10-CM | POA: Diagnosis not present

## 2022-06-01 DIAGNOSIS — Z791 Long term (current) use of non-steroidal anti-inflammatories (NSAID): Secondary | ICD-10-CM | POA: Diagnosis not present

## 2022-06-01 DIAGNOSIS — Z720 Tobacco use: Secondary | ICD-10-CM | POA: Diagnosis not present

## 2022-06-01 DIAGNOSIS — J449 Chronic obstructive pulmonary disease, unspecified: Secondary | ICD-10-CM | POA: Diagnosis not present

## 2022-06-01 DIAGNOSIS — I10 Essential (primary) hypertension: Secondary | ICD-10-CM | POA: Diagnosis not present

## 2022-06-01 DIAGNOSIS — I252 Old myocardial infarction: Secondary | ICD-10-CM | POA: Diagnosis not present

## 2022-06-04 ENCOUNTER — Encounter: Payer: Self-pay | Admitting: Family Medicine

## 2022-06-10 ENCOUNTER — Ambulatory Visit: Payer: Medicare HMO | Attending: Physician Assistant | Admitting: Physician Assistant

## 2022-06-10 ENCOUNTER — Encounter: Payer: Self-pay | Admitting: Physician Assistant

## 2022-06-10 VITALS — BP 138/88 | HR 64 | Ht 66.0 in | Wt 179.0 lb

## 2022-06-10 DIAGNOSIS — I7143 Infrarenal abdominal aortic aneurysm, without rupture: Secondary | ICD-10-CM

## 2022-06-10 DIAGNOSIS — Z01812 Encounter for preprocedural laboratory examination: Secondary | ICD-10-CM

## 2022-06-10 DIAGNOSIS — I2510 Atherosclerotic heart disease of native coronary artery without angina pectoris: Secondary | ICD-10-CM | POA: Diagnosis not present

## 2022-06-10 DIAGNOSIS — E785 Hyperlipidemia, unspecified: Secondary | ICD-10-CM | POA: Diagnosis not present

## 2022-06-10 NOTE — Progress Notes (Signed)
Cardiology Office Note:    Date:  06/12/2022   ID:  Jason Munoz, DOB 1955/04/30, MRN [id number]  PCP:  Jason Paradise, MD   [facility] Providers Cardiologist:  Jason Swaziland, MD     Referring MD: Jason Paradise, MD   Chief Complaint  Patient presents with   Follow-up    Seen for Dr. Swaziland    History of Present Illness:    Jason Munoz is a 67 y.o. Jason with a hx of CAD, AAA, HLD, tobacco abuse, OSA on CPAP and GERD.  He underwent BMS to LAD in 2006.  Nuclear stress test in 2014 was negative. Ultrasound obtained in 2020 showed AAA measuring at 3.4 cm. He stopped taking the losartan HCTZ and his abdominal pain went away. He had CT abdomen and pelvis with contrast on 08/03/2019, this revealed a stable 3.4 cm infrarenal abdominal aortic aneurysm. Echocardiogram obtained on 08/24/2019 showed EF 60 to 65%, grade 1 DD.  Myoview obtained on 08/17/2019 showed EF 46%, small defect of mild severity present in the basal inferior and mid inferior location, this is consistent with ischemia, overall it was a low risk study.  Subsequent cardiac catheterization performed on 09/01/2019 revealed single-vessel disease with 100% proximal to mid RCA occlusion, patent proximal LAD stent, 60% mid LAD disease, 40% OM1 lesion. Normal right heart pressure. It was felt that his symptom may be related to RCA CTO, therefore he was brought back to the hospital on 09/06/2019 for CTO PCI. He eventually received Synergy 2.5 x 32 mm DES to RCA.  He was started on Praluent with excellent response.  Most recent CTA of abdomen pelvis obtained on 09/22/2021 and [facility] revealed infrarenal abdominal aortic aneurysm measuring 4.2 x 3.6 cm, this is increased from the previous 3.7 x 3 cm.  Patent celiac, SMA and IMA.  Patient presents today for follow-up.  He denies any recent chest pain or shortness of breath.  He continued to have some abdominal issue although improved recently.  We plan to obtain repeat CT of  abdomen pelvis with contrast around August of this year.  He has blood pressure and heart rate are very well-controlled.  Will continue on the current therapy.  He can follow-up in 6 to 8 months.  He denies any chest pain or shortness of breath.   Past Medical History:  Diagnosis Date   AAA (abdominal aortic aneurysm) (HCC) 09/2018   3.4cm   Allergy    Anxiety    Arthritis    Colitis    Coronary artery disease 01/2005   stent LAD in 2006   COVID-19 01/2020   Emphysema lung (HCC)    GERD (gastroesophageal reflux disease)    HTN (hypertension)    Hypercholesteremia    Myocardial infarction (HCC)    Sleep apnea    uses cpap   Snoring    Substance abuse (HCC)    Umbilical hernia    Ventral hernia     Past Surgical History:  Procedure Laterality Date   2006     APPENDECTOMY     bilateral ankle fractures     CARPAL TUNNEL RELEASE     COLONOSCOPY     CORONARY ANGIOGRAPHY N/A 09/06/2019   Procedure: CORONARY ANGIOGRAPHY;  Surgeon: Munoz, Jason M, MD;  Location: [facility] INVASIVE CV LAB;  Service: Cardiovascular;  Laterality: N/A;   CORONARY CTO INTERVENTION N/A 09/06/2019   Procedure: CORONARY CTO INTERVENTION;  Surgeon: Munoz, Jason M, MD;  Location: [facility] INVASIVE CV  LAB;  Service: Cardiovascular;  Laterality: N/A;  RCA   CORONARY ULTRASOUND/IVUS N/A 09/06/2019   Procedure: Intravascular Ultrasound/IVUS;  Surgeon: Munoz, Jason M, MD;  Location: [facility] INVASIVE CV LAB;  Service: Cardiovascular;  Laterality: N/A;   ELBOW SURGERY Right    EXTERNAL EAR SURGERY     NECK SURGERY     RIGHT/LEFT HEART CATH AND CORONARY ANGIOGRAPHY N/A 09/01/2019   Procedure: RIGHT/LEFT HEART CATH AND CORONARY ANGIOGRAPHY;  Surgeon: Munoz, Jason M, MD;  Location: [facility] INVASIVE CV LAB;  Service: Cardiovascular;  Laterality: N/A;   UPPER GASTROINTESTINAL ENDOSCOPY      Current Medications: Current Meds  Medication Sig   aspirin 81 MG chewable tablet Chew 81 mg by mouth daily.    atorvastatin (LIPITOR) 40 MG  tablet TAKE 1 TABLET BY MOUTH EVERY DAY   busPIRone (BUSPAR) 10 MG tablet Take 1 tablet (10 mg total) by mouth at bedtime.   celecoxib (CELEBREX) 200 MG capsule Take 200 mg by mouth daily.   escitalopram (LEXAPRO) 10 MG tablet Take 10 mg by mouth daily.    Evolocumab (REPATHA SURECLICK) 140 MG/ML SOAJ Inject 1 Pen into the skin every 14 (fourteen) days.   ipratropium (ATROVENT) 0.06 % nasal spray 1-2 sprays in each nostril   metoprolol succinate (TOPROL-XL) 25 MG 24 hr tablet Take 25 mg by mouth every evening.   Multiple Vitamin (MULTIVITAMIN WITH MINERALS) TABS tablet Take 1 tablet by mouth daily with lunch.   nitroGLYCERIN (NITROSTAT) 0.4 MG SL tablet PLACE 1 TABLET (0.4 MG TOTAL) UNDER THE TONGUE EVERY 5 (FIVE) MINUTES AS NEEDED FOR CHEST PAIN (UP TO 3 DOSES. IF CHEST PAIN PERSISTENT AFTER THE 3RD DOSE, CALL 911). (Patient taking differently: Place 0.4 mg under the tongue as needed for chest pain (Up to 3 doses. If chest pain persistent after the 3rd dose, call 911).)   pantoprazole (PROTONIX) 40 MG tablet Take 40 mg by mouth 2 (two) times daily.   tamsulosin (FLOMAX) 0.4 MG CAPS capsule Take 0.4 mg by mouth every evening.      Allergies:   Patient has no known allergies.   Social History   Socioeconomic History   Marital status: Married    Spouse name: Not on file   Number of children: 3   Years of education: HS   Highest education level: Not on file  Occupational History   Occupation: retired     Associate Professor: [employer]  Tobacco Use   Smoking status: Former    Packs/day: 1.50    Years: 33.00    Additional pack years: 0.00    Total pack years: 49.50    Types: Cigarettes    Quit date: 06/04/2019    Years since quitting: 3.0   Smokeless tobacco: Former  Building services engineer Use: Former  Substance and Sexual Activity   Alcohol use: Never   Drug use: No   Sexual activity: Yes    Birth control/protection: None  Other Topics Concern   Not on file  Social History Narrative    Lives at home with his wife.   Right-handed.   No caffeine use.   Social Determinants of Health   Financial Resource Strain: Not on file  Food Insecurity: Not on file  Transportation Needs: Not on file  Physical Activity: Not on file  Stress: Not on file  Social Connections: Not on file     Family History: The patient's family history includes COPD in his father. There is no history of Stomach cancer,  Rectal cancer, Esophageal cancer, Colon cancer, or Colon polyps.  ROS:   Please see the history of present illness.     All other systems reviewed and are negative.  EKGs/Labs/Other Studies Reviewed:    The following studies were reviewed today:  Cath 09/06/2019 Prox LAD lesion is 35% stenosed. Mid LAD lesion is 60% stenosed. 1st Mrg lesion is 40% stenosed. Prox RCA to Mid RCA lesion is 100% stenosed. Post intervention, there is a 0% residual stenosis. A drug-eluting stent was successfully placed using a SYNERGY XD 2.50X32.   1. Successful CTO PCI of the mid RCA with DES x 1 and IVUS guidance   Plan: observe overnight . Anticipate DC in am. DAPT for one year.   EKG:  EKG is ordered today.  The ekg ordered today demonstrates normal sinus rhythm, no significant ST-T wave changes.  Recent Labs: No results found for requested labs within last 365 days.  Recent Lipid Panel    Component Value Date/Time   CHOL 79 (L) 04/07/2021 1052   TRIG 112 04/07/2021 1052   HDL 51 04/07/2021 1052   CHOLHDL 1.5 04/07/2021 1052   LDLCALC 8 04/07/2021 1052     Risk Assessment/Calculations:           Physical Exam:    VS:  BP 138/88 (BP Location: Left Arm, Patient Position: Sitting, Cuff Size: Large)   Pulse 64   Ht 5\' 6"  (1.676 m)   Wt 179 lb (81.2 kg)   SpO2 97%   BMI 28.89 kg/m        Wt Readings from Last 3 Encounters:  06/10/22 179 lb (81.2 kg)  04/13/22 178 lb 3.2 oz (80.8 kg)  12/01/21 178 lb 9.6 oz (81 kg)     GEN:  Well nourished, well developed in no acute  distress HEENT: Normal NECK: No JVD; No carotid bruits LYMPHATICS: No lymphadenopathy CARDIAC: RRR, no murmurs, rubs, gallops RESPIRATORY:  Clear to auscultation without rales, wheezing or rhonchi  ABDOMEN: Soft, non-tender, non-distended MUSCULOSKELETAL:  No edema; No deformity  SKIN: Warm and dry NEUROLOGIC:  Alert and oriented x 3 PSYCHIATRIC:  Normal affect   ASSESSMENT:    1. Infrarenal abdominal aortic aneurysm (AAA) without rupture (HCC)   2. Coronary artery disease involving native coronary artery of native heart without angina pectoris   3. Pre-procedure lab exam   4. Hyperlipidemia LDL goal <70    PLAN:    In order of problems listed above:  Infrarenal abdominal aortic aneurysm: Obtain CT abdomen pelvis with contrast.  CAD: Denies any recent chest pain  Hyperlipidemia: On Lipitor and Repatha.           Medication Adjustments/Labs and Tests Ordered: Current medicines are reviewed at length with the patient today.  Concerns regarding medicines are outlined above.  Orders Placed This Encounter  Procedures   CT ABDOMEN PELVIS W CONTRAST   Basic metabolic panel   EKG 12-Lead   No orders of the defined types were placed in this encounter.   Patient Instructions  Medication Instructions:   Your physician recommends that you continue on your current medications as directed. Please refer to the Current Medication list given to you today.  *If you need a refill on your cardiac medications before your next appointment, please call your pharmacy*  Lab Work: Your physician recommends that you return for lab work 1 week prior to CT:  BMP  If you have labs (blood work) drawn today and your tests are completely normal, you  will receive your results only by: MyChart Message (if you have MyChart) OR A paper copy in the mail If you have any lab test that is abnormal or we need to change your treatment, we will call you to review the  results.  Testing/Procedures: Non-Cardiac CT scanning, (CAT scanning), is a noninvasive, special x-ray that produces cross-sectional images of the body using x-rays and a computer. CT scans help physicians diagnose and treat medical conditions. For some CT exams, a contrast material is used to enhance visibility in the area of the body being studied. CT scans provide greater clarity and reveal more details than regular x-ray exams.  CT Abdomen Pelvis with contrast schedule for August 2024  Follow-Up: At [facility], you and your health needs are our priority.  As part of our continuing mission to provide you with exceptional heart care, we have created designated Provider Care Teams.  These Care Teams include your primary Cardiologist (physician) and Advanced Practice Providers (APPs -  Physician Assistants and Nurse Practitioners) who all work together to provide you with the care you need, when you need it.    Your next appointment:   6-8 month(s)  Provider:   Peter Swaziland, MD     Other Instructions     Signed, Azalee Course, PA  06/12/2022 10:23 PM    [facility]

## 2022-06-10 NOTE — Patient Instructions (Addendum)
Medication Instructions:   Your physician recommends that you continue on your current medications as directed. Please refer to the Current Medication list given to you today.  *If you need a refill on your cardiac medications before your next appointment, please call your pharmacy*  Lab Work: Your physician recommends that you return for lab work 1 week prior to CT:  BMP  If you have labs (blood work) drawn today and your tests are completely normal, you will receive your results only by: MyChart Message (if you have MyChart) OR A paper copy in the mail If you have any lab test that is abnormal or we need to change your treatment, we will call you to review the results.  Testing/Procedures: Non-Cardiac CT scanning, (CAT scanning), is a noninvasive, special x-ray that produces cross-sectional images of the body using x-rays and a computer. CT scans help physicians diagnose and treat medical conditions. For some CT exams, a contrast material is used to enhance visibility in the area of the body being studied. CT scans provide greater clarity and reveal more details than regular x-ray exams.  CT Abdomen Pelvis with contrast schedule for August 2024  Follow-Up: At [facility], you and your health needs are our priority.  As part of our continuing mission to provide you with exceptional heart care, we have created designated Provider Care Teams.  These Care Teams include your primary Cardiologist (physician) and Advanced Practice Providers (APPs -  Physician Assistants and Nurse Practitioners) who all work together to provide you with the care you need, when you need it.    Your next appointment:   6-8 month(s)  Provider:   Peter Swaziland, MD     Other Instructions

## 2022-06-12 ENCOUNTER — Encounter: Payer: Self-pay | Admitting: Physician Assistant

## 2022-06-18 NOTE — Telephone Encounter (Signed)
Called pt. Advised we received notification that he had not read mychart message sent by AL,NP. I relayed message. He states he is back to using CPAP and has been trying to use 4 hr or more each night. Tolerating better. He will continue to try and use 4 or more hours each night. No concerns at this time. He will call back if anything changes. I snipped recent report below:

## 2022-06-27 DIAGNOSIS — G4733 Obstructive sleep apnea (adult) (pediatric): Secondary | ICD-10-CM | POA: Diagnosis not present

## 2022-07-02 ENCOUNTER — Telehealth: Payer: Self-pay

## 2022-07-02 ENCOUNTER — Other Ambulatory Visit (HOSPITAL_COMMUNITY): Payer: Self-pay

## 2022-07-02 NOTE — Telephone Encounter (Signed)
Pharmacy Patient Advocate Encounter   Received notification from [facility] that prior authorization for REPATHA 140MG /ML is required/requested.   PA submitted on 07/02/22(ins) [employer] Part D via CoverMyMeds Key or (Medicaid) confirmation # [id number]  Status is pending

## 2022-07-02 NOTE — Telephone Encounter (Signed)
Patient called and stated that he took his last dose of Repatha yesterday. He states that his new insurance company Administrator) will not pay for the prescription without a prior authorization.

## 2022-07-03 DIAGNOSIS — M47812 Spondylosis without myelopathy or radiculopathy, cervical region: Secondary | ICD-10-CM | POA: Diagnosis not present

## 2022-07-03 NOTE — Telephone Encounter (Signed)
Pharmacy Patient Advocate Encounter  Prior Authorization for REPATHA has been approved.    Effective dates: 04/17/22 through 02/16/23   , [facility] Pharmacy Patient Advocate Specialist Direct Number: [phone] Fax: [phone] 

## 2022-07-06 NOTE — Telephone Encounter (Signed)
Patient made aware of approval

## 2022-07-06 NOTE — Telephone Encounter (Signed)
Approved. See other phone note. Pt made aware

## 2022-07-09 DIAGNOSIS — H25813 Combined forms of age-related cataract, bilateral: Secondary | ICD-10-CM | POA: Diagnosis not present

## 2022-07-09 DIAGNOSIS — H524 Presbyopia: Secondary | ICD-10-CM | POA: Diagnosis not present

## 2022-07-27 DIAGNOSIS — G4733 Obstructive sleep apnea (adult) (pediatric): Secondary | ICD-10-CM | POA: Diagnosis not present

## 2022-07-28 DIAGNOSIS — G4733 Obstructive sleep apnea (adult) (pediatric): Secondary | ICD-10-CM | POA: Diagnosis not present

## 2022-08-03 DIAGNOSIS — M542 Cervicalgia: Secondary | ICD-10-CM | POA: Diagnosis not present

## 2022-08-03 DIAGNOSIS — M47812 Spondylosis without myelopathy or radiculopathy, cervical region: Secondary | ICD-10-CM | POA: Diagnosis not present

## 2022-08-26 DIAGNOSIS — G4733 Obstructive sleep apnea (adult) (pediatric): Secondary | ICD-10-CM | POA: Diagnosis not present

## 2022-08-27 DIAGNOSIS — G4733 Obstructive sleep apnea (adult) (pediatric): Secondary | ICD-10-CM | POA: Diagnosis not present

## 2022-09-07 ENCOUNTER — Other Ambulatory Visit: Payer: Medicare HMO

## 2022-09-14 DIAGNOSIS — Z01812 Encounter for preprocedural laboratory examination: Secondary | ICD-10-CM | POA: Diagnosis not present

## 2022-09-16 ENCOUNTER — Encounter: Payer: Self-pay | Admitting: Cardiology

## 2022-09-21 ENCOUNTER — Telehealth: Payer: Self-pay | Admitting: Cardiology

## 2022-09-21 NOTE — Telephone Encounter (Signed)
Kendal Hymen from [facility] [employer] is calling because the patient's PA from the insurance is currently under review. Kendal Hymen stated that the insurance is wanting to do a peer to peer. Kendal Hymen is requesting we call her back to verify if we are willing to do complete it for the PA for upcoming scan this week. Kendal Hymen gave the insurance phone number of [phone]. Kendal Hymen gave a reference number of [id number]. Please advise.

## 2022-09-21 NOTE — Telephone Encounter (Signed)
Spoke to Burns with [facility].She stated patient is scheduled for a ct of abd and pelvis.Stated Dr.Jordan will need to call patient's insurance for a peer to peer review by this Sat 8/10 for insurance to approve.Phone # and reference # listed below.Advised Dr.Jordan is out of office this week.I will make him aware.

## 2022-09-22 NOTE — Telephone Encounter (Signed)
Message sent to [facility] PA.

## 2022-09-23 ENCOUNTER — Other Ambulatory Visit: Payer: Medicare HMO

## 2022-09-23 NOTE — Telephone Encounter (Signed)
Peer to peer scheduled between Dr. Dionne Bucy and me for tomorrow 8/8 @ 1PM.

## 2022-09-24 NOTE — Telephone Encounter (Signed)
Spoke with Dr. Dionne Bucy for peer to peer review. Patient has been approved for CTA of abdomen and pelvis.   Approval number: [id number] Patient has been approved until Feb 2025.   Please notify Kendal Hymen of [facility]

## 2022-09-24 NOTE — Telephone Encounter (Signed)
Spoke with Dr. Dionne Bucy for peer to peer review. Patient has been approved for CTA of abdomen and pelvis.    Approval number: [id number] Patient has been approved until Feb 2025.    Please notify Kendal Hymen of [facility]      Note  Called Kendal Hymen of [facility] and gave information above. LVM with information above.

## 2022-09-26 DIAGNOSIS — G4733 Obstructive sleep apnea (adult) (pediatric): Secondary | ICD-10-CM | POA: Diagnosis not present

## 2022-09-27 ENCOUNTER — Other Ambulatory Visit: Payer: Self-pay | Admitting: Cardiology

## 2022-09-27 DIAGNOSIS — G4733 Obstructive sleep apnea (adult) (pediatric): Secondary | ICD-10-CM | POA: Diagnosis not present

## 2022-09-28 DIAGNOSIS — M79671 Pain in right foot: Secondary | ICD-10-CM | POA: Diagnosis not present

## 2022-09-28 DIAGNOSIS — I2510 Atherosclerotic heart disease of native coronary artery without angina pectoris: Secondary | ICD-10-CM | POA: Diagnosis not present

## 2022-09-28 DIAGNOSIS — R7301 Impaired fasting glucose: Secondary | ICD-10-CM | POA: Diagnosis not present

## 2022-09-28 DIAGNOSIS — I129 Hypertensive chronic kidney disease with stage 1 through stage 4 chronic kidney disease, or unspecified chronic kidney disease: Secondary | ICD-10-CM | POA: Diagnosis not present

## 2022-09-30 ENCOUNTER — Ambulatory Visit
Admission: RE | Admit: 2022-09-30 | Discharge: 2022-09-30 | Disposition: A | Discharge status: Home / Self Care | Payer: Medicare HMO | Source: Ambulatory Visit | Attending: Cardiology | Admitting: Cardiology

## 2022-09-30 ENCOUNTER — Other Ambulatory Visit: Payer: Self-pay

## 2022-09-30 ENCOUNTER — Telehealth: Payer: Self-pay | Admitting: Cardiology

## 2022-09-30 ENCOUNTER — Inpatient Hospital Stay: Admission: RE | Admit: 2022-09-30 | Payer: Medicare HMO | Source: Ambulatory Visit

## 2022-09-30 ENCOUNTER — Other Ambulatory Visit: Payer: Self-pay | Admitting: Physician Assistant

## 2022-09-30 DIAGNOSIS — I700 Atherosclerosis of aorta: Secondary | ICD-10-CM | POA: Diagnosis not present

## 2022-09-30 DIAGNOSIS — I714 Abdominal aortic aneurysm, without rupture, unspecified: Secondary | ICD-10-CM

## 2022-09-30 MED ORDER — IOPAMIDOL (ISOVUE-370) INJECTION 76%
100.0000 mL | Freq: Once | INTRAVENOUS | Status: AC | PRN
  Administered 2022-09-30: 100 mL via INTRAVENOUS

## 2022-09-30 NOTE — Telephone Encounter (Signed)
Azalee Course PA spoke to [facility] this morning.Order already placed.

## 2022-09-30 NOTE — Telephone Encounter (Signed)
[facility] is requesting [place] to change the order that we've sent in. [facility] needs the order to say "CTA Abdomen and Pelvis". The patient is scheduled this morning.

## 2022-10-05 ENCOUNTER — Other Ambulatory Visit: Payer: Self-pay

## 2022-10-05 ENCOUNTER — Telehealth: Payer: Self-pay | Admitting: Cardiology

## 2022-10-05 DIAGNOSIS — I714 Abdominal aortic aneurysm, without rupture, unspecified: Secondary | ICD-10-CM

## 2022-10-05 DIAGNOSIS — N2889 Other specified disorders of kidney and ureter: Secondary | ICD-10-CM

## 2022-10-05 DIAGNOSIS — M47812 Spondylosis without myelopathy or radiculopathy, cervical region: Secondary | ICD-10-CM | POA: Diagnosis not present

## 2022-10-05 NOTE — Telephone Encounter (Signed)
Riki Rusk with [facility] called to state that pt's CT Angio of ABd/Pelvis are ready for review.

## 2022-10-05 NOTE — Telephone Encounter (Signed)
Dr.Jordan already reviewed.See result note.

## 2022-10-05 NOTE — Telephone Encounter (Signed)
Jason Munoz with  [facility] calling to report CT results

## 2022-10-06 ENCOUNTER — Telehealth: Payer: Self-pay | Admitting: Cardiology

## 2022-10-06 DIAGNOSIS — N2889 Other specified disorders of kidney and ureter: Secondary | ICD-10-CM

## 2022-10-06 NOTE — Telephone Encounter (Signed)
Pt wife called in about ct results. She states they spoke to [facility] and they need a referral and CT results faxed over to number below. She states he hasn't been seen in over 3 years so he will treated as a NP   Fax number: [phone]

## 2022-10-06 NOTE — Telephone Encounter (Signed)
Call returned. Informed pt that the referral to [facility] has been sent and the CT results were faxed.

## 2022-10-07 ENCOUNTER — Ambulatory Visit: Payer: Medicare HMO | Admitting: Vascular Surgery

## 2022-10-07 ENCOUNTER — Encounter: Payer: Self-pay | Admitting: Vascular Surgery

## 2022-10-07 VITALS — BP 135/88 | HR 59 | Temp 97.9°F | Resp 20 | Ht 66.0 in | Wt 179.8 lb

## 2022-10-07 DIAGNOSIS — I7143 Infrarenal abdominal aortic aneurysm, without rupture: Secondary | ICD-10-CM | POA: Diagnosis not present

## 2022-10-07 NOTE — Progress Notes (Signed)
Patient ID: Jason Munoz, male   DOB: Jan 18, 1956, 67 y.o.   MRN: [id number]  Reason for Consult: New Patient (Initial Visit)   Referred by Geoffry Paradise, MD  Subjective:     HPI:  Jason Munoz is a 67 y.o. male here for evaluation of abdominal arctic aneurysm.  He recently with CT scan for abdominal pain and was found to have an aneurysm which was larger than previously known size.  He does have a significant family history of aneurysms in a couple of male family members 1 of which died prior to repair.  He does not have any new back pain and his abdominal pain has been stable causing diarrhea without any definitive diagnosis.  He remains active.  He is a current every day smoker.  Patient recorded our conversation for his wife with my knowledge.  Past Medical History:  Diagnosis Date   AAA (abdominal aortic aneurysm) (HCC) 09/2018   3.4cm   Allergy    Anxiety    Arthritis    Colitis    Coronary artery disease 01/2005   stent LAD in 2006   COVID-19 01/2020   Emphysema lung (HCC)    GERD (gastroesophageal reflux disease)    HTN (hypertension)    Hypercholesteremia    Myocardial infarction (HCC)    Sleep apnea    uses cpap   Snoring    Substance abuse (HCC)    Umbilical hernia    Ventral hernia    Family History  Problem Relation Age of Onset   COPD Father    Stomach cancer Neg Hx    Rectal cancer Neg Hx    Esophageal cancer Neg Hx    Colon cancer Neg Hx    Colon polyps Neg Hx    Past Surgical History:  Procedure Laterality Date   2006     APPENDECTOMY     bilateral ankle fractures     CARPAL TUNNEL RELEASE     COLONOSCOPY     CORONARY ANGIOGRAPHY N/A 09/06/2019   Procedure: CORONARY ANGIOGRAPHY;  Surgeon: Swaziland, Peter M, MD;  Location: [facility] INVASIVE CV LAB;  Service: Cardiovascular;  Laterality: N/A;   CORONARY CTO INTERVENTION N/A 09/06/2019   Procedure: CORONARY CTO INTERVENTION;  Surgeon: Swaziland, Peter M, MD;  Location: [facility] INVASIVE CV LAB;  Service:  Cardiovascular;  Laterality: N/A;  RCA   CORONARY ULTRASOUND/IVUS N/A 09/06/2019   Procedure: Intravascular Ultrasound/IVUS;  Surgeon: Swaziland, Peter M, MD;  Location: [facility] INVASIVE CV LAB;  Service: Cardiovascular;  Laterality: N/A;   ELBOW SURGERY Right    EXTERNAL EAR SURGERY     NECK SURGERY     RIGHT/LEFT HEART CATH AND CORONARY ANGIOGRAPHY N/A 09/01/2019   Procedure: RIGHT/LEFT HEART CATH AND CORONARY ANGIOGRAPHY;  Surgeon: Swaziland, Peter M, MD;  Location: [facility] INVASIVE CV LAB;  Service: Cardiovascular;  Laterality: N/A;   UPPER GASTROINTESTINAL ENDOSCOPY      Short Social History:  Social History   Tobacco Use   Smoking status: Former    Current packs/day: 0.00    Average packs/day: 1.5 packs/day for 33.0 years (49.5 ttl pk-yrs)    Types: Cigarettes    Start date: 06/04/1986    Quit date: 06/04/2019    Years since quitting: 3.3   Smokeless tobacco: Former  Substance Use Topics   Alcohol use: Never    No Known Allergies  Current Outpatient Medications  Medication Sig Dispense Refill   aspirin 81 MG chewable tablet Chew 81 mg by mouth daily.  atorvastatin (LIPITOR) 40 MG tablet TAKE 1 TABLET BY MOUTH EVERY DAY 90 tablet 3   busPIRone (BUSPAR) 10 MG tablet Take 1 tablet (10 mg total) by mouth at bedtime. 90 tablet 3   celecoxib (CELEBREX) 200 MG capsule Take 200 mg by mouth daily.     escitalopram (LEXAPRO) 10 MG tablet Take 10 mg by mouth daily.      Evolocumab (REPATHA SURECLICK) 140 MG/ML SOAJ Inject 1 Pen into the skin every 14 (fourteen) days. 6 mL 3   ipratropium (ATROVENT) 0.06 % nasal spray 1-2 sprays in each nostril     metoprolol succinate (TOPROL-XL) 25 MG 24 hr tablet Take 25 mg by mouth every evening.     Multiple Vitamin (MULTIVITAMIN WITH MINERALS) TABS tablet Take 1 tablet by mouth daily with lunch.     nitroGLYCERIN (NITROSTAT) 0.4 MG SL tablet PLACE 1 TABLET (0.4 MG TOTAL) UNDER THE TONGUE EVERY 5 (FIVE) MINUTES AS NEEDED FOR CHEST PAIN (UP TO 3 DOSES. IF CHEST  PAIN PERSISTENT AFTER THE 3RD DOSE, CALL 911). (Patient taking differently: Place 0.4 mg under the tongue as needed for chest pain (Up to 3 doses. If chest pain persistent after the 3rd dose, call 911).) 25 tablet 3   pantoprazole (PROTONIX) 40 MG tablet Take 40 mg by mouth 2 (two) times daily.     tamsulosin (FLOMAX) 0.4 MG CAPS capsule Take 0.4 mg by mouth every evening.      No current facility-administered medications for this visit.    Review of Systems  Constitutional:  Constitutional negative. HENT: HENT negative.  Eyes: Eyes negative.  Respiratory: Respiratory negative.  Cardiovascular: Cardiovascular negative.  GI: Gastrointestinal negative.  Musculoskeletal: Musculoskeletal negative.  Skin: Skin negative.  Neurological: Neurological negative. Hematologic: Hematologic/lymphatic negative.  Psychiatric: Psychiatric negative.        Objective:  Objective   Vitals:   10/07/22 1521  BP: 135/88  Pulse: (!) 59  Resp: 20  Temp: 97.9 F (36.6 C)  SpO2: 97%  Weight: 179 lb 12.8 oz (81.6 kg)  Height: 5\' 6"  (1.676 m)   Body mass index is 29.02 kg/m.  Physical Exam HENT:     Head: Normocephalic.     Nose: Nose normal.  Eyes:     Pupils: Pupils are equal, round, and reactive to light.  Cardiovascular:     Rate and Rhythm: Normal rate.     Pulses:          Popliteal pulses are 0 on the right side and 0 on the left side.  Pulmonary:     Effort: Pulmonary effort is normal.  Abdominal:     General: Abdomen is flat.  Musculoskeletal:     Cervical back: Normal range of motion and neck supple.     Right lower leg: No edema.     Left lower leg: No edema.  Skin:    General: Skin is warm.     Capillary Refill: Capillary refill takes less than 2 seconds.  Neurological:     General: No focal deficit present.     Mental Status: He is alert.  Psychiatric:        Mood and Affect: Mood normal.        Thought Content: Thought content normal.        Judgment: Judgment  normal.     Data: CT IMPRESSION: New right renal mass, 2.7 cm compatible with renal cell carcinoma. Referral to Urology recommended for further evaluation.   These above results will be  called to the ordering clinician or representative by the Radiologist Assistant, and communication documented in the PACS or [employer].   Infrarenal abdominal aortic aneurysm, estimated 4.8 cm. Recommend follow-up every 6 months and vascular consultation. This recommendation follows ACR consensus guidelines: White Paper of the ACR Incidental Findings Committee II on Vascular Findings. J Am Coll Radiol 2013; 10:789-794. Aortic aneurysm NOS (ICD10-I71.9).       Assessment/Plan:    67 year old male with 4.8 cm abdominal aortic aneurysm.  I discussed with him the signs and symptoms of rupture as well as need for repair when the right reaches 5.5 cm.  Patient with new diagnosis of right renal mass will have evaluation by urology next month and is cleared from a vascular standpoint if surgery is required.  Will schedule 41-month follow-up in our office with duplex.  We discussed the plan for repeat CTA when approaching 5.5 cm.  We also discussed the signs and symptoms of rupture he demonstrates good understanding.  Short of this we will follow-up in 6 months with ultrasound.     Maeola Harman MD [facility]

## 2022-10-12 DIAGNOSIS — M67441 Ganglion, right hand: Secondary | ICD-10-CM | POA: Diagnosis not present

## 2022-10-15 ENCOUNTER — Other Ambulatory Visit: Payer: Self-pay | Admitting: Cardiology

## 2022-10-15 DIAGNOSIS — D49511 Neoplasm of unspecified behavior of right kidney: Secondary | ICD-10-CM | POA: Diagnosis not present

## 2022-10-22 ENCOUNTER — Other Ambulatory Visit: Payer: Self-pay | Admitting: Urology

## 2022-10-22 ENCOUNTER — Telehealth: Payer: Self-pay | Admitting: Cardiology

## 2022-10-22 NOTE — Telephone Encounter (Signed)
   Pre-operative Risk Assessment    Patient Name: Jason Munoz  DOB: 08-Jul-1955 MRN: [id number]      Request for Surgical Clearance    Procedure:   Partial Neprectomy   Date of Surgery:  Clearance 12/11/22                                 Surgeon:  Dr. Liliane Shi  Surgeon's Group or Practice Name:  [facility] Phone number:  [phone] Fax number:  [phone]   Type of Clearance Requested:   - Medical  - Pharmacy:  Hold Aspirin     Type of Anesthesia:  General    Additional requests/questions:    Alben Spittle   10/22/2022, 3:38 PM

## 2022-10-22 NOTE — Telephone Encounter (Signed)
   Name: Jason Munoz  DOB: 1955/04/26  MRN: [id number]  Primary Cardiologist: Peter Swaziland, MD   Preoperative team, please contact this patient and set up a phone call appointment for further preoperative risk assessment. Please obtain consent and complete medication review. Thank you for your help. Last seen by Azalee Course on 06/10/2022  I confirm that guidance regarding antiplatelet and oral anticoagulation therapy has been completed and, if necessary, noted below.  Per office protocol, if patient is without any new symptoms or concerns at the time of their virtual visit, he/she may hold ASA for 7 days prior to procedure. Please resume ASA as soon as possible postprocedure, at the discretion of the surgeon.     Joni Reining, NP 10/22/2022, 4:18 PM [facility]

## 2022-10-23 NOTE — Telephone Encounter (Signed)
Routed incorrectly Sent to Pre-Op call back

## 2022-10-23 NOTE — Telephone Encounter (Signed)
Patient is already scheduled to see Dr. Swaziland at 10/10 at 3:00 pm. I will route to requesting provider's office via Epic.

## 2022-10-26 DIAGNOSIS — G4733 Obstructive sleep apnea (adult) (pediatric): Secondary | ICD-10-CM | POA: Diagnosis not present

## 2022-10-27 ENCOUNTER — Other Ambulatory Visit: Payer: Self-pay

## 2022-10-27 DIAGNOSIS — J432 Centrilobular emphysema: Secondary | ICD-10-CM | POA: Diagnosis not present

## 2022-10-27 DIAGNOSIS — C641 Malignant neoplasm of right kidney, except renal pelvis: Secondary | ICD-10-CM | POA: Diagnosis not present

## 2022-10-27 DIAGNOSIS — I7143 Infrarenal abdominal aortic aneurysm, without rupture: Secondary | ICD-10-CM

## 2022-10-27 DIAGNOSIS — D49511 Neoplasm of unspecified behavior of right kidney: Secondary | ICD-10-CM | POA: Diagnosis not present

## 2022-10-27 DIAGNOSIS — R911 Solitary pulmonary nodule: Secondary | ICD-10-CM | POA: Diagnosis not present

## 2022-10-27 DIAGNOSIS — I700 Atherosclerosis of aorta: Secondary | ICD-10-CM | POA: Diagnosis not present

## 2022-10-28 DIAGNOSIS — G4733 Obstructive sleep apnea (adult) (pediatric): Secondary | ICD-10-CM | POA: Diagnosis not present

## 2022-11-19 ENCOUNTER — Other Ambulatory Visit: Payer: Self-pay | Admitting: *Deleted

## 2022-11-19 DIAGNOSIS — Z122 Encounter for screening for malignant neoplasm of respiratory organs: Secondary | ICD-10-CM

## 2022-11-19 DIAGNOSIS — Z87891 Personal history of nicotine dependence: Secondary | ICD-10-CM

## 2022-11-19 DIAGNOSIS — F17210 Nicotine dependence, cigarettes, uncomplicated: Secondary | ICD-10-CM

## 2022-11-20 NOTE — Progress Notes (Unsigned)
Cardiology Office Note:    Date:  11/20/2022   ID:  Jason Munoz, DOB 10-08-1955, MRN [id number]  PCP:  Geoffry Paradise, MD  [facility] Cardiologist:  Emberly Tomasso [place], MD  [facility] Electrophysiologist:  None   Referring MD: Geoffry Paradise, MD   No chief complaint on file.   History of Present Illness:    Jason Munoz is a 67 y.o. male with a hx of CAD, hyperlipidemia, AAA, obstructive sleep apnea, GERD and tobacco abuse.  He underwent BMS to LAD in 2006.   He was seen by Azalee Course PA-C on 08/04/2019. He was complaining of worsening dyspnea on exertion at the time. He quit smoking in April however has since noticing intermittent chest tightness across the entire chest.   Echocardiogram obtained on 08/24/2019 showed EF 60 to 65%, grade 1 DD.  Myoview obtained on 08/17/2019 showed EF 46%, small defect of mild severity present in the basal inferior and mid inferior location, this is consistent with ischemia, overall it was a low risk study. He continued to have symptoms so a  Cardiac catheterization was performed on 09/01/2019 revealed single-vessel disease with 100% proximal to mid RCA occlusion, patent proximal LAD stent, 60% mid LAD disease, 40% OM1 lesion.  Normal right heart pressure.  It was felt that his symptom may be related to RCA CTO, therefore he was brought back to the hospital on 09/06/2019 for CTO PCI.  He eventually received Synergy 2.5 x 32 mm DES to RCA.    Ultrasound obtained in 2020 showed AAA measuring at 3.4 cm.   CT abdomen and pelvis with contrast on 08/03/2019, this revealed a stable 3.4 cm infrarenal abdominal aortic aneurysm.    In the fall he was seen in the lipid clinic and started on Praluent. Excellent response.   On his last visit he was having a lot of trouble with his breathing and feeling tired during the day. He had a new sleep study and his CPAP was adjusted.  He was seen in the ED in [place] in August with abdominal pain. He was treated for  diverticulitis. Abd CT did demonstrate a AAA at 4.2 cm.   He was seen in April by Azalee Course PA-C. Repeat CT abd/pelvis showed 4.8 cm AAA with thrombus. He also had new right renal mass. Was seen by Dr Randie Heinz with [facility]. Plan close follow up of AAA with [place] in 6 months. Has also seen Dr Liliane Shi with Urology with plans for right nephrectomy.     Past Medical History:  Diagnosis Date   AAA (abdominal aortic aneurysm) (HCC) 09/2018   3.4cm   Allergy    Anxiety    Arthritis    Colitis    Coronary artery disease 01/2005   stent LAD in 2006   COVID-19 01/2020   Emphysema lung (HCC)    GERD (gastroesophageal reflux disease)    HTN (hypertension)    Hypercholesteremia    Myocardial infarction (HCC)    Sleep apnea    uses cpap   Snoring    Substance abuse (HCC)    Umbilical hernia    Ventral hernia     Past Surgical History:  Procedure Laterality Date   2006     APPENDECTOMY     bilateral ankle fractures     CARPAL TUNNEL RELEASE     COLONOSCOPY     CORONARY ANGIOGRAPHY N/A 09/06/2019   Procedure: CORONARY ANGIOGRAPHY;  Surgeon: [place], Kevia Zaucha M, MD;  Location: [facility] INVASIVE CV LAB;  Service: Cardiovascular;  Laterality: N/A;   CORONARY CTO INTERVENTION N/A 09/06/2019   Procedure: CORONARY CTO INTERVENTION;  Surgeon: [place], Vincenza Dail M, MD;  Location: [facility] INVASIVE CV LAB;  Service: Cardiovascular;  Laterality: N/A;  RCA   CORONARY ULTRASOUND/IVUS N/A 09/06/2019   Procedure: Intravascular Ultrasound/IVUS;  Surgeon: [place], Chane Cowden M, MD;  Location: [facility] INVASIVE CV LAB;  Service: Cardiovascular;  Laterality: N/A;   ELBOW SURGERY Right    EXTERNAL EAR SURGERY     NECK SURGERY     RIGHT/LEFT HEART CATH AND CORONARY ANGIOGRAPHY N/A 09/01/2019   Procedure: RIGHT/LEFT HEART CATH AND CORONARY ANGIOGRAPHY;  Surgeon: [place], Davin Archuletta M, MD;  Location: [facility] INVASIVE CV LAB;  Service: Cardiovascular;  Laterality: N/A;   UPPER GASTROINTESTINAL ENDOSCOPY      Current Medications: No outpatient medications have  been marked as taking for the 11/26/22 encounter (Appointment) with [place], Manju Kulkarni M, MD.     Allergies:   Patient has no known allergies.   Social History   Socioeconomic History   Marital status: Married    Spouse name: Not on file   Number of children: 3   Years of education: HS   Highest education level: Not on file  Occupational History   Occupation: retired     Associate Professor: [employer]  Tobacco Use   Smoking status: Former    Current packs/day: 0.00    Average packs/day: 1.5 packs/day for 33.0 years (49.5 ttl pk-yrs)    Types: Cigarettes    Start date: 06/04/1986    Quit date: 06/04/2019    Years since quitting: 3.4   Smokeless tobacco: Former  Building services engineer status: Former  Substance and Sexual Activity   Alcohol use: Never   Drug use: No   Sexual activity: Yes    Birth control/protection: None  Other Topics Concern   Not on file  Social History Narrative   Lives at home with his wife.   Right-handed.   No caffeine use.   Social Determinants of Health   Financial Resource Strain: Not on file  Food Insecurity: Not on file  Transportation Needs: Not on file  Physical Activity: Not on file  Stress: Not on file  Social Connections: Unknown (06/29/2021)   Received from [facility]   Social Network    Social Network: Not on file     Family History: The patient's family history includes COPD in his father. There is no history of Stomach cancer, Rectal cancer, Esophageal cancer, Colon cancer, or Colon polyps.  ROS:   Please see the history of present illness.     All other systems reviewed and are negative.  EKGs/Labs/Other Studies Reviewed:    The following studies were reviewed today:  Cath 09/01/2019 Prox LAD lesion is 35% stenosed. Mid LAD lesion is 60% stenosed. Prox RCA to Mid RCA lesion is 100% stenosed. 1st Mrg lesion is 40% stenosed. LV end diastolic pressure is normal.   1. Single vessel occlusive CAD involving the  RCA. The prior stent in the proximal LAD is still patent. There is a borderline stenosis in the mid LAD 2. Normal LV filling pressures. 3. Normal right heart pressures 4. Normal cardiac output.   Plan: based on findings today and recent stress test I feel his symptoms are related to the RCA occlusion. Symptoms have been present for 3-4 months. He is on good medical therapy. Options are to attempt CTO PCI versus increasing medical therapy. Will discuss with patient.    Cath  09/06/2019 Prox LAD lesion is 35% stenosed. Mid LAD lesion is 60% stenosed. 1st Mrg lesion is 40% stenosed. Prox RCA to Mid RCA lesion is 100% stenosed. Post intervention, there is a 0% residual stenosis. A drug-eluting stent was successfully placed using a SYNERGY XD [id number].   1. Successful CTO PCI of the mid RCA with DES x 1 and IVUS guidance   Plan: observe overnight . Anticipate DC in am. DAPT for one year.    CT ANGIOGRAM OF THE ABDOMEN AND PELVIS   TECHNIQUE: CTA of the abdomen and pelvis was performed after the administration of 80 mL of Isovue-370 contrast.  Radiation dose reduction was utilized (automated exposure control, mA or kV adjustment based on patient size, or iterative image reconstruction).  Image post processing was performed creating multi planar 2D and angiographic 3D MIP, shaded surface rendering, or 3D volume rendering images for review and comparison.   COMPARISON:  CT 12/23/2018   INDICATION: abdominal pain, hx of AAA   FINDINGS:   VASCULAR:   - There is an infrarenal abdominal aortic aneurysm which measures 4.2 x 3.6 cm in greatest axial plane, previously 3.7 x 3 cm. Interval increase in noncalcified atheromatous plaque/mural thrombus along the posterior margin distally. There is also mild scattered calcified plaque. There is no wall thickening or surrounding inflammation.  - Patent celiac, superior mesenteric, renal, and inferior mesenteric arteries.  - Normal caliber bilateral common  iliac arteries.   NONVASCULAR:   - Lung Bases: Unremarkable.   - Liver: Normal contour. No definite liver lesions.   - Biliary Tree and Gallbladder: No biliary ductal dilatation.  The gallbladder is unremarkable.   - Pancreas: Normal.   - Spleen: Normal.   - Adrenal Glands: Normal.   - Kidneys and Bladder: No hydronephrosis. There is a simple right renal cyst which is stable; no specific follow-up is recommended. Bladder is unremarkable.   - Gastrointestinal Tract: No bowel obstruction or bowel wall thickening. There is mild colonic diverticulosis.   - Peritoneal Cavity and Retroperitoneum: No free fluid.  No free intraperitoneal air.   - Lymph Nodes: No retroperitoneal, mesenteric or pelvic lymphadenopathy.   - Reproductive: Unremarkable prostate. Vasectomy clips.   - Miscellaneous: Small fat-containing umbilical hernia.   MUSCULOSKELETAL:  - Remote healed left lateral rib fractures. There is severe multilevel degenerative disc disease throughout the visualized thoracic and lumbar spine. Chronic sclerosis at the bilateral sacroiliac joints is unchanged. Procedure Note  Eddie Candle, MD - 09/22/2021  Formatting of this note might be different from the original.  CT ANGIOGRAM OF THE ABDOMEN AND PELVIS     CLINICAL DATA:  67 year old male with a history of abdominal aortic aneurysm   EXAM: CTA ABDOMEN AND PELVIS WITHOUT AND WITH CONTRAST   TECHNIQUE: Multidetector CT imaging of the abdomen and pelvis was performed using the standard protocol during bolus administration of intravenous contrast. Multiplanar reconstructed images and MIPs were obtained and reviewed to evaluate the vascular anatomy.   RADIATION DOSE REDUCTION: This exam was performed according to the departmental dose-optimization program which includes automated exposure control, adjustment of the mA and/or kV according to patient size and/or use of iterative reconstruction technique.    CONTRAST:  ISOVUE-370 IOPAMIDOL (ISOVUE-370) INJECTION 76%   COMPARISON:  08/03/2019, 10/07/2018, 11/10/2015   FINDINGS: VASCULAR   Aorta: Minimal atherosclerotic changes of the lower thoracic aorta.   Diameter at the hiatus estimated 2.4 cm.   Mild atherosclerotic changes of the abdominal aorta. No dissection, pedunculated plaque,  ulcerated plaque.   Infrarenal abdominal aortic aneurysm with the greatest diameter on the axial images estimated 4.8 cm. Prior diameter 3.4 cm on the CT 08/03/2019. No periaortic fluid or inflammatory changes.   75% circumferential thrombus/plaque within the aneurysm sac. No stenosis.   Celiac: Celiac artery with mild atherosclerotic changes. Branches are patent   SMA: SMA patent with mild atherosclerotic changes.  Branches patent.   Renals:   - Right: Single right renal artery with mild atherosclerotic changes.   - Left: Single left renal artery without significant atherosclerotic changes.   IMA: IMA patent.   Right lower extremity:   Mild tortuosity of the right iliac system. Mild atherosclerotic changes. No aneurysm, dissection, or occlusion. Hypogastric artery is patent. Common femoral artery patent with minimal atherosclerosis. Proximal SFA and profunda femoris patent.   Left lower extremity:   Mild tortuosity of the left iliac system. Mild atherosclerotic changes. Ectasia of the distal left common iliac artery estimated 16 mm. Hypogastric artery is patent. Common femoral artery patent. Proximal SFA and profunda femoris patent.   Veins: Unremarkable appearance of the venous system.   Review of the MIP images confirms the above findings.   NON-VASCULAR   Lower chest: No acute.   Hepatobiliary: Unremarkable appearance of the liver. Unremarkable gall bladder.   Pancreas: Unremarkable.   Spleen: Unremarkable.   Adrenals/Urinary Tract:   - Right adrenal gland: Unremarkable   - Left adrenal gland: Unremarkable.    - Right kidney: No hydronephrosis, nephrolithiasis, inflammation, or ureteral dilation. New solid mass of the right renal cortex with hyperenhancement. Greatest diameter estimated 2.7 cm.   Bosniak 1 cyst of the posterior right renal cortex, estimated 4.8 cm.   - Left Kidney: No hydronephrosis, nephrolithiasis, inflammation, or ureteral dilation. No focal lesion.   - Urinary Bladder: Urinary bladder decompressed. Circumferential bladder wall thickening.   Stomach/Bowel:   - Stomach: Unremarkable.   - Small bowel: Unremarkable   - Appendix: Appendix is not visualized, however, no inflammatory changes are present adjacent to the cecum to indicate an appendicitis.   - Colon: Left-sided colonic diverticular disease. No inflammatory changes.   Lymphatic: No adenopathy.   Mesenteric: No free fluid or air. No mesenteric adenopathy.   Reproductive: Transverse diameter of the prostate 3.9 cm.   Other: No hernia.   Musculoskeletal: Degenerative changes of the visualized thoracolumbar spine. No significant bony canal narrowing. No acute displaced fracture. Degenerative changes of the hips.   IMPRESSION: New right renal mass, 2.7 cm compatible with renal cell carcinoma. Referral to Urology recommended for further evaluation.   These above results will be called to the ordering clinician or representative by the Radiologist Assistant, and communication documented in the PACS or [employer].   Infrarenal abdominal aortic aneurysm, estimated 4.8 cm. Recommend follow-up every 6 months and vascular consultation. This recommendation follows ACR consensus guidelines: White Paper of the ACR Incidental Findings Committee II on Vascular Findings. J Am Coll Radiol 2013; 10:789-794. Aortic aneurysm NOS (ICD10-I71.9).   Aortic atherosclerosis. Associated mild mesenteric and renal artery disease. Mild iliac arterial disease. Aortic Atherosclerosis (ICD10-I70.0).   Additional  ancillary findings as above.   Signed,   Yvone Neu. Miachel Roux, RPVI   [facility]   [facility]  EKG:  EKG is not ordered today.   Recent Labs: 09/14/2022: BUN 9; Creatinine, Ser 0.90; Potassium 3.9; Sodium 139  Recent Lipid Panel    Component Value Date/Time   CHOL 79 (L) 04/07/2021 1052  TRIG 112 04/07/2021 1052   HDL 51 04/07/2021 1052   CHOLHDL 1.5 04/07/2021 1052   LDLCALC 8 04/07/2021 1052     Dated 01/30/19: cholesterol 190, triglycerides 107, HDL 40, LDL 129.  Dated 08/03/19: A1c 5.8%.LFTs and TSH normal Dated 09/07/19: creatinine 1.44. otherwise BMET and CBC normal. Dated 03/11/20: cholesterol 64, triglycerides 90, HDL 36, LDL 10. potassium 5. LFTs normal. Dated 03/12/21: cholesterol 89, triglycerides 157, HDL 48, LDL 15. Creatinine 1.5. A1c 5.2%. otherwise CBC, CMET and TSH normal.  Dated 09/28/22: A1c 5.5%  Physical Exam:    VS:  There were no vitals taken for this visit.    Wt Readings from Last 3 Encounters:  10/07/22 179 lb 12.8 oz (81.6 kg)  06/10/22 179 lb (81.2 kg)  04/13/22 178 lb 3.2 oz (80.8 kg)     GEN:  Well nourished, well developed in no acute distress HEENT: Normal NECK: No JVD; No carotid bruits LYMPHATICS: No lymphadenopathy CARDIAC: RRR, no murmurs, rubs, gallops RESPIRATORY:  Clear to auscultation without rales, wheezing or rhonchi  ABDOMEN: Soft, non-tender, non-distended MUSCULOSKELETAL:  No edema; No deformity  SKIN: Warm and dry NEUROLOGIC:  Alert and oriented x 3 PSYCHIATRIC:  Normal affect   ASSESSMENT:    No diagnosis found.    PLAN:    In order of problems listed above:  CAD: s/p remote BMS of LAD. S/p CTO PCI of RCA in July 2021. No chest pain. Continue ASA, Statin, and metoprolol  Hyperlipidemia:  Now on Praluent with great response. LDL down to 8.  On atorvastatin.  AAA: recent CT showed increase in size to 4.8 cm. Now followed by Dr Randie Heinz. Plan [place] 6 months  4.    Dyspnea with history of tobacco abuse. Improved with CPAP adjustment.  5.  Right renal mass. Seen by Urology- Dr Liliane Shi. Plan resection  Follow up in 6 months.  Medication Adjustments/Labs and Tests Ordered: Current medicines are reviewed at length with the patient today.  Concerns regarding medicines are outlined above.  No orders of the defined types were placed in this encounter.  No orders of the defined types were placed in this encounter.   There are no Patient Instructions on file for this visit.   Signed, Shikara Mcauliffe [place], MD  11/20/2022 7:36 AM    [facility]

## 2022-11-25 DIAGNOSIS — G4733 Obstructive sleep apnea (adult) (pediatric): Secondary | ICD-10-CM | POA: Diagnosis not present

## 2022-11-26 ENCOUNTER — Encounter: Payer: Self-pay | Admitting: Cardiology

## 2022-11-26 ENCOUNTER — Ambulatory Visit: Payer: Medicare HMO | Attending: Cardiology | Admitting: Cardiology

## 2022-11-26 VITALS — BP 104/70 | HR 68 | Ht 66.0 in | Wt 189.0 lb

## 2022-11-26 DIAGNOSIS — N2889 Other specified disorders of kidney and ureter: Secondary | ICD-10-CM

## 2022-11-26 DIAGNOSIS — E785 Hyperlipidemia, unspecified: Secondary | ICD-10-CM | POA: Diagnosis not present

## 2022-11-26 DIAGNOSIS — I714 Abdominal aortic aneurysm, without rupture, unspecified: Secondary | ICD-10-CM | POA: Diagnosis not present

## 2022-11-26 DIAGNOSIS — I2510 Atherosclerotic heart disease of native coronary artery without angina pectoris: Secondary | ICD-10-CM

## 2022-11-26 DIAGNOSIS — E7800 Pure hypercholesterolemia, unspecified: Secondary | ICD-10-CM | POA: Diagnosis not present

## 2022-11-26 NOTE — Patient Instructions (Signed)
Medication Instructions:  Continue medications *If you need a refill on your cardiac medications before your next appointment, please call your pharmacy*   Lab Work: none   Testing/Procedures: none   Follow-Up: At [facility], you and your health needs are our priority.  As part of our continuing mission to provide you with exceptional heart care, we have created designated Provider Care Teams.  These Care Teams include your primary Cardiologist (physician) and Advanced Practice Providers (APPs -  Physician Assistants and Nurse Practitioners) who all work together to provide you with the care you need, when you need it.  We recommend signing up for the patient portal called "MyChart".  Sign up information is provided on this After Visit Summary.  MyChart is used to connect with patients for Virtual Visits (Telemedicine).  Patients are able to view lab/test results, encounter notes, upcoming appointments, etc.  Non-urgent messages can be sent to your provider as well.   To learn more about what you can do with MyChart, go to [email or web address].    Your next appointment:   Follow up in 6 months,   call in January for appointment is April   Provider:   Dr. Sw[place]iland  Cleared to have upcoming surgery

## 2022-11-27 DIAGNOSIS — G4733 Obstructive sleep apnea (adult) (pediatric): Secondary | ICD-10-CM | POA: Diagnosis not present

## 2022-12-02 NOTE — Patient Instructions (Addendum)
SURGICAL WAITING ROOM VISITATION  Patients having surgery or a procedure may have no more than 2 support people in the waiting area - these visitors may rotate.    Children under the age of 27 must have an adult with them who is not the patient.  Due to an increase in RSV and influenza rates and associated hospitalizations, children ages 20 and under may not visit patients in [facility] hospitals.  If the patient needs to stay at the hospital during part of their recovery, the visitor guidelines for inpatient rooms apply. Pre-op nurse will coordinate an appropriate time for 1 support person to accompany patient in pre-op.  This support person may not rotate.    Please refer to the [facility] website for the visitor guidelines for Inpatients (after your surgery is over and you are in a regular room).    Your procedure is scheduled on: 12/11/22   Report to [facility] Main Entrance    Report to admitting at 10:15 AM   Call this number if you have problems the morning of surgery [phone]   Follow a clear liquid diet the day before surgery.  Water Non-Citrus Juices (without pulp, NO RED-Apple, White grape, White cranberry) Black Coffee (NO MILK/CREAM OR CREAMERS, sugar ok)  Clear Tea (NO MILK/CREAM OR CREAMERS, sugar ok) regular and decaf                             Plain Jell-O (NO RED)                                           Fruit ices (not with fruit pulp, NO RED)                                     Popsicles (NO RED)                                                               Sports drinks like Gatorade (NO RED)              Nothing to drink after midnight          If you have questions, please contact your surgeon's office.   FOLLOW BOWEL PREP AND ANY ADDITIONAL PRE OP INSTRUCTIONS YOU RECEIVED FROM YOUR SURGEON'S OFFICE!!!     Oral Hygiene is also important to reduce your risk of infection.                                    Remember - BRUSH YOUR TEETH THE  MORNING OF SURGERY WITH YOUR REGULAR TOOTHPASTE  DENTURES WILL BE REMOVED PRIOR TO SURGERY PLEASE DO NOT APPLY "Poly grip" OR ADHESIVES!!!   Stop all vitamins and herbal supplements 7 days before surgery.   Take these medicines the morning of surgery with A SIP OF WATER: Atorvastatin, Buspirone, Lexapro, Inhalers, Pantoprazole   Bring CPAP mask and tubing day of surgery.  You may not have any metal on your body including  jewelry, and body piercing             Do not wear lotions, powders, cologne, or deodorant              Men may shave face and neck.   Do not bring valuables to the hospital. [facility] IS NOT             RESPONSIBLE   FOR VALUABLES.   Contacts, glasses, dentures or bridgework may not be worn into surgery.   Bring small overnight bag day of surgery.   DO NOT BRING YOUR HOME MEDICATIONS TO THE HOSPITAL. PHARMACY WILL DISPENSE MEDICATIONS LISTED ON YOUR MEDICATION LIST TO YOU DURING YOUR ADMISSION IN THE HOSPITAL!    Special Instructions: Bring a copy of your healthcare power of attorney and living will documents the day of surgery if you haven't scanned them before.              Please read over the following fact sheets you were given: IF YOU HAVE QUESTIONS ABOUT YOUR PRE-OP INSTRUCTIONS PLEASE CALL [phone]   If you received a COVID test during your pre-op visit  it is requested that you wear a mask when out in public, stay away from anyone that may not be feeling well and notify your surgeon if you develop symptoms. If you test positive for Covid or have been in contact with anyone that has tested positive in the last 10 days please notify you surgeon.    [facility] - Preparing for Surgery Before surgery, you can play an important role.  Because skin is not sterile, your skin needs to be as free of germs as possible.  You can reduce the number of germs on your skin by washing with CHG (chlorahexidine gluconate) soap  before surgery.  CHG is an antiseptic cleaner which kills germs and bonds with the skin to continue killing germs even after washing. Please DO NOT use if you have an allergy to CHG or antibacterial soaps.  If your skin becomes reddened/irritated stop using the CHG and inform your nurse when you arrive at Short Stay. Do not shave (including legs and underarms) for at least 48 hours prior to the first CHG shower.  You may shave your face/neck.  Please follow these instructions carefully:  1.  Shower with CHG Soap the night before surgery and the  morning of surgery.  2.  If you choose to wash your hair, wash your hair first as usual with your normal  shampoo.  3.  After you shampoo, rinse your hair and body thoroughly to remove the shampoo.                             4.  Use CHG as you would any other liquid soap.  You can apply chg directly to the skin and wash.  Gently with a scrungie or clean washcloth.  5.  Apply the CHG Soap to your body ONLY FROM THE NECK DOWN.   Do   not use on face/ open                           Wound or open sores. Avoid contact with eyes, ears mouth and   genitals (private parts).  Wash face,  Genitals (private parts) with your normal soap.             6.  Wash thoroughly, paying special attention to the area where your    surgery  will be performed.  7.  Thoroughly rinse your body with warm water from the neck down.  8.  DO NOT shower/wash with your normal soap after using and rinsing off the CHG Soap.                9.  Pat yourself dry with a clean towel.            10.  Wear clean pajamas.            11.  Place clean sheets on your bed the night of your first shower and do not  sleep with pets. Day of Surgery : Do not apply any lotions/deodorants the morning of surgery.  Please wear clean clothes to the hospital/surgery center.  FAILURE TO FOLLOW THESE INSTRUCTIONS MAY RESULT IN THE CANCELLATION OF YOUR SURGERY  PATIENT  SIGNATURE_________________________________  NURSE SIGNATURE__________________________________  ________________________________________________________________________ WHAT IS A BLOOD TRANSFUSION? Blood Transfusion Information  A transfusion is the replacement of blood or some of its parts. Blood is made up of multiple cells which provide different functions. Red blood cells carry oxygen and are used for blood loss replacement. White blood cells fight against infection. Platelets control bleeding. Plasma helps clot blood. Other blood products are available for specialized needs, such as hemophilia or other clotting disorders. BEFORE THE TRANSFUSION  Who gives blood for transfusions?  Healthy volunteers who are fully evaluated to make sure their blood is safe. This is blood bank blood. Transfusion therapy is the safest it has ever been in the practice of medicine. Before blood is taken from a donor, a complete history is taken to make sure that person has no history of diseases nor engages in risky social behavior (examples are intravenous drug use or sexual activity with multiple partners). The donor's travel history is screened to minimize risk of transmitting infections, such as malaria. The donated blood is tested for signs of infectious diseases, such as HIV and hepatitis. The blood is then tested to be sure it is compatible with you in order to minimize the chance of a transfusion reaction. If you or a relative donates blood, this is often done in anticipation of surgery and is not appropriate for emergency situations. It takes many days to process the donated blood. RISKS AND COMPLICATIONS Although transfusion therapy is very safe and saves many lives, the main dangers of transfusion include:  Getting an infectious disease. Developing a transfusion reaction. This is an allergic reaction to something in the blood you were given. Every precaution is taken to prevent this. The decision to  have a blood transfusion has been considered carefully by your caregiver before blood is given. Blood is not given unless the benefits outweigh the risks. AFTER THE TRANSFUSION Right after receiving a blood transfusion, you will usually feel much better and more energetic. This is especially true if your red blood cells have gotten low (anemic). The transfusion raises the level of the red blood cells which carry oxygen, and this usually causes an energy increase. The nurse administering the transfusion will monitor you carefully for complications. HOME CARE INSTRUCTIONS  No special instructions are needed after a transfusion. You may find your energy is better. Speak with your caregiver about any limitations on activity for underlying diseases you may  have. SEEK MEDICAL CARE IF:  Your condition is not improving after your transfusion. You develop redness or irritation at the intravenous (IV) site. SEEK IMMEDIATE MEDICAL CARE IF:  Any of the following symptoms occur over the next 12 hours: Shaking chills. You have a temperature by mouth above 102 F (38.9 C), not controlled by medicine. Chest, back, or muscle pain. People around you feel you are not acting correctly or are confused. Shortness of breath or difficulty breathing. Dizziness and fainting. You get a rash or develop hives. You have a decrease in urine output. Your urine turns a dark color or changes to pink, red, or brown. Any of the following symptoms occur over the next 10 days: You have a temperature by mouth above 102 F (38.9 C), not controlled by medicine. Shortness of breath. Weakness after normal activity. The white part of the eye turns yellow (jaundice). You have a decrease in the amount of urine or are urinating less often. Your urine turns a dark color or changes to pink, red, or brown. Document Released: 01/31/2000 Document Revised: 04/27/2011 Document Reviewed: 09/19/2007 [facility] Patient Information 2014  [place], [place].  _______________________________________________________________________

## 2022-12-02 NOTE — Progress Notes (Addendum)
COVID Vaccine Completed: yes  Date of COVID positive in last 90 days:  PCP - Geoffry Paradise, MD Cardiologist - Peter Swaziland, MD LOV 11/26/22  Cardiac clearance by Peter Swaziland 11/26/22 in Epic  CT- 12/11/21 Epic Chest x-ray - n/a EKG - 06/10/22 Epic Stress Test - 08/17/19 Epic ECHO - 08/24/19 Epic Cardiac Cath - 09/01/19 Epic Pacemaker/ICD device last checked: n/a Spinal Cord Stimulator: n/a  Bowel Prep - clear liquids day before  Sleep Study - yes CPAP - yes, every night  Fasting Blood Sugar - n/a Checks Blood Sugar _____ times a day  Last dose of GLP1 agonist-  N/A GLP1 instructions:  N/A   Last dose of SGLT-2 inhibitors-  N/A SGLT-2 instructions: N/A   Blood Thinner Instructions:  Time Aspirin Instructions: ASA 81, hold 5 days Last Dose:  Activity level: Can go up a flight of stairs and perform activities of daily living without stopping and without symptoms of chest pain. SOB with exertion due to hx of emphysema   Anesthesia review: CAD, COPD, OSA, stent, AAA, HTN  Patient denies shortness of breath, fever, cough and chest pain at PAT appointment  Patient verbalized understanding of instructions that were given to them at the PAT appointment. Patient was also instructed that they will need to review over the PAT instructions again at home before surgery.

## 2022-12-04 ENCOUNTER — Encounter (HOSPITAL_COMMUNITY): Payer: Self-pay

## 2022-12-04 ENCOUNTER — Encounter (HOSPITAL_COMMUNITY)
Admission: RE | Admit: 2022-12-04 | Discharge: 2022-12-04 | Disposition: A | Discharge status: Home / Self Care | Payer: Medicare HMO | Source: Ambulatory Visit | Attending: Urology

## 2022-12-04 ENCOUNTER — Other Ambulatory Visit: Payer: Self-pay

## 2022-12-04 DIAGNOSIS — N2889 Other specified disorders of kidney and ureter: Secondary | ICD-10-CM | POA: Insufficient documentation

## 2022-12-04 DIAGNOSIS — I10 Essential (primary) hypertension: Secondary | ICD-10-CM | POA: Insufficient documentation

## 2022-12-04 DIAGNOSIS — Z955 Presence of coronary angioplasty implant and graft: Secondary | ICD-10-CM | POA: Insufficient documentation

## 2022-12-04 DIAGNOSIS — I2510 Atherosclerotic heart disease of native coronary artery without angina pectoris: Secondary | ICD-10-CM | POA: Diagnosis not present

## 2022-12-04 DIAGNOSIS — J449 Chronic obstructive pulmonary disease, unspecified: Secondary | ICD-10-CM | POA: Insufficient documentation

## 2022-12-04 DIAGNOSIS — G4730 Sleep apnea, unspecified: Secondary | ICD-10-CM | POA: Insufficient documentation

## 2022-12-04 DIAGNOSIS — Z01818 Encounter for other preprocedural examination: Secondary | ICD-10-CM | POA: Diagnosis not present

## 2022-12-04 DIAGNOSIS — N390 Urinary tract infection, site not specified: Secondary | ICD-10-CM | POA: Diagnosis not present

## 2022-12-04 DIAGNOSIS — Z01812 Encounter for preprocedural laboratory examination: Secondary | ICD-10-CM | POA: Diagnosis not present

## 2022-12-04 LAB — BASIC METABOLIC PANEL
Anion gap: 9 (ref 5–15)
BUN: 15 mg/dL (ref 8–23)
CO2: 27 mmol/L (ref 22–32)
Calcium: 9.2 mg/dL (ref 8.9–10.3)
Chloride: 103 mmol/L (ref 98–111)
Creatinine, Ser: 1.23 mg/dL (ref 0.61–1.24)
GFR, Estimated: >60 mL/min (ref >60)
Glucose, Bld: 95 mg/dL (ref 70–99)
Potassium: 4.2 mmol/L (ref 3.5–5.1)
Sodium: 139 mmol/L (ref 135–145)

## 2022-12-04 LAB — CBC
HCT: 45.3 % (ref 39.0–52.0)
Hemoglobin: 14.9 g/dL (ref 13.0–17.0)
MCH: 28.9 pg (ref 26.0–34.0)
MCHC: 32.9 g/dL (ref 30.0–36.0)
MCV: 87.8 fL (ref 80.0–100.0)
Platelets: 238 10*3/uL (ref 150–400)
RBC: 5.16 MIL/uL (ref 4.22–5.81)
RDW: 16 % — ABNORMAL HIGH (ref 11.5–15.5)
WBC: 7.8 10*3/uL (ref 4.0–10.5)
nRBC: 0 % (ref 0.0–0.2)

## 2022-12-04 NOTE — Plan of Care (Signed)
CHL Tonsillectomy/Adenoidectomy, Postoperative PEDS care plan entered in error.

## 2022-12-07 NOTE — Anesthesia Preprocedure Evaluation (Addendum)
Anesthesia Evaluation  Patient identified by MRN, date of birth, ID band Patient awake    Reviewed: Allergy & Precautions, H&P , NPO status , Patient's Chart, lab work & pertinent test results  Airway Mallampati: II   Neck ROM: full    Dental   Pulmonary sleep apnea , COPD, former smoker   breath sounds clear to auscultation       Cardiovascular hypertension, + CAD, + Past MI and + Cardiac Stents   Rhythm:regular Rate:Normal     Neuro/Psych   Anxiety        GI/Hepatic PUD,GERD  ,,  Endo/Other    Renal/GU      Musculoskeletal  (+) Arthritis ,    Abdominal   Peds  Hematology   Anesthesia Other Findings   Reproductive/Obstetrics                             Anesthesia Physical Anesthesia Plan  ASA: 3  Anesthesia Plan: General   Post-op Pain Management:    Induction: Intravenous  PONV Risk Score and Plan: 2 and Ondansetron, Dexamethasone, Treatment may vary due to age or medical condition and Midazolam  Airway Management Planned: Oral ETT  Additional Equipment: Arterial line  Intra-op Plan:   Post-operative Plan: Extubation in OR  Informed Consent: I have reviewed the patients History and Physical, chart, labs and discussed the procedure including the risks, benefits and alternatives for the proposed anesthesia with the patient or authorized representative who has indicated his/her understanding and acceptance.     Dental advisory given  Plan Discussed with: CRNA, Anesthesiologist and Surgeon  Anesthesia Plan Comments: (See PAT note 12/04/2022)       Anesthesia Quick Evaluation

## 2022-12-07 NOTE — Progress Notes (Signed)
Anesthesia Chart Review   Case: [id number] Date/Time: 12/11/22 1215   Procedure: XI ROBOTIC ASSITED RIGHT PARTIAL NEPHRECTOMY (Right) - 180 MINUTES   Anesthesia type: General   Pre-op diagnosis: RIGHT RENAL MASS   Location: [facility] ROOM 05 / [facility]   Surgeons: Rene Paci, MD       DISCUSSION:67 y.o. former smoker with h/o HTN, sleep apnea with CPAP, CAD s/p stent to LAD 2006, DES to RCA 2021, COPD, right renal mass scheduled for above procedure 12/11/22 with Dr. Cristal Deer Liliane Shi.   Pt last seen by cardiology 11/26/2022. Per OV note, "s/p remote BMS of LAD. S/p CTO PCI of RCA in July 2021. No chest pain. Continue ASA, Statin, and metoprolol. OK to hold ASA for 5 days prior to surgery. He is cleared for nephrectomy from a cardiac standpoint."   "AAA: recent CT showed increase in size to 4.8 cm. Now followed by Dr Randie Heinz. Plan [place] 6 month"  VS: BP (!) 145/89   Pulse 65   Temp 37 C (Oral)   Resp 14   Ht 5\' 6"  (1.676 m)   Wt 83.9 kg   SpO2 100%   BMI 29.86 kg/m   PROVIDERS: Geoffry Paradise, MD is PCP   [place], Peter, MD is Cardiologist  LABS: Labs reviewed: Acceptable for surgery. (all labs ordered are listed, but only abnormal results are displayed)  Labs Reviewed  CBC - Abnormal; Notable for the following components:      Result Value   RDW 16.0 (*)    All other components within normal limits  BASIC METABOLIC PANEL  TYPE AND SCREEN     IMAGES:   EKG:   CV: Myocardial Perfusion 08/17/19 The left ventricular ejection fraction is mildly decreased (45-54%). Nuclear stress EF: 46%. There was no ST segment deviation noted during stress. Defect 1: There is a small defect of mild severity present in the basal inferior and mid inferior location. Findings consistent with ischemia. This is a low risk study.   Low risk stress nuclear study with mild inferior wall ischemia and mildly reduced left ventricular global systolic function.  Echo 08/24/2019  1. Left  ventricular ejection fraction, by estimation, is 60 to 65%. The  left ventricle has normal function. The left ventricle has no regional  wall motion abnormalities. Left ventricular diastolic parameters are  consistent with Grade I diastolic  dysfunction (impaired relaxation).   2. Right ventricular systolic function is normal. The right ventricular  size is normal.   3. The mitral valve is grossly normal. Trivial mitral valve  regurgitation. No evidence of mitral stenosis.   4. The aortic valve is tricuspid. Aortic valve regurgitation is not  visualized. No aortic stenosis is present.  Past Medical History:  Diagnosis Date   AAA (abdominal aortic aneurysm) (HCC) 09/2018   3.4cm   Allergy    Anxiety    Arthritis    Colitis    Coronary artery disease 01/2005   stent LAD in 2006   COVID-19 01/2020   Emphysema lung (HCC)    GERD (gastroesophageal reflux disease)    HTN (hypertension)    Hypercholesteremia    Myocardial infarction [facility])    Sleep apnea    uses cpap   Snoring    Substance abuse (HCC)    Umbilical hernia    Ventral hernia     Past Surgical History:  Procedure Laterality Date   2006     APPENDECTOMY     bilateral ankle fractures  CARPAL TUNNEL RELEASE     COLONOSCOPY     CORONARY ANGIOGRAPHY N/A 09/06/2019   Procedure: CORONARY ANGIOGRAPHY;  Surgeon: Swaziland, Peter M, MD;  Location: [facility] INVASIVE CV LAB;  Service: Cardiovascular;  Laterality: N/A;   CORONARY CTO INTERVENTION N/A 09/06/2019   Procedure: CORONARY CTO INTERVENTION;  Surgeon: Swaziland, Peter M, MD;  Location: [facility] INVASIVE CV LAB;  Service: Cardiovascular;  Laterality: N/A;  RCA   CORONARY ULTRASOUND/IVUS N/A 09/06/2019   Procedure: Intravascular Ultrasound/IVUS;  Surgeon: Swazil[place], Peter M, MD;  Location: [facility] INVASIVE CV LAB;  Service: Cardiovascular;  Laterality: N/A;   DG ARTHRO THUMB*R*     ELBOW SURGERY Right    EXTERNAL EAR SURGERY     NECK SURGERY     RIGHT/LEFT HEART CATH AND CORONARY  ANGIOGRAPHY N/A 09/01/2019   Procedure: RIGHT/LEFT HEART CATH AND CORONARY ANGIOGRAPHY;  Surgeon: Swaziland, Peter M, MD;  Location: [facility] INVASIVE CV LAB;  Service: Cardiovascular;  Laterality: N/A;   UPPER GASTROINTESTINAL ENDOSCOPY      MEDICATIONS:  aspirin 81 MG chewable tablet   atorvastatin (LIPITOR) 40 MG tablet   busPIRone (BUSPAR) 10 MG tablet   celecoxib (CELEBREX) 200 MG capsule   escitalopram (LEXAPRO) 10 MG tablet   Evolocumab (REPATHA SURECLICK) 140 MG/ML SOAJ   Fluticasone-Umeclidin-Vilant (TRELEGY ELLIPTA) 100-62.5-25 MCG/ACT AEPB   metoprolol succinate (TOPROL-XL) 25 MG 24 hr tablet   nitroGLYCERIN (NITROSTAT) 0.4 MG SL tablet   NON FORMULARY   pantoprazole (PROTONIX) 40 MG tablet   tamsulosin (FLOMAX) 0.4 MG CAPS capsule   No current facility-administered medications for this encounter.     Jodell Cipro Ward, PA-C [facility] Pre-Surgical Testing [phone]

## 2022-12-09 NOTE — H&P (Signed)
Office Visit Report     12/04/2022   --------------------------------------------------------------------------------   Jason Munoz  MRN: [id number]  DOB: Oct 15, 1955, 67 year old Male  PRIMARY CARE:  Richard A. Jacky Kindle, MD  PRIMARY CARE FAX:  [phone]  REFERRING:  Jason Deer A. Liliane Shi, MD  PROVIDER:  Rhoderick Moody, M.D.  TREATING:  Anne Fu, NP  LOCATION:  [facility] [phone]     --------------------------------------------------------------------------------   CC/HPI: Renal mass   Jason Munoz is a 67 year old male with a solid and enhancing renal mass measuring 2.7 cm involving the RIGHT kidney. The mass was discovered on CTA on 09/30/2022 during surveillance of his 4.8 cm AAA. New since CT abd/pel with contrast in 2020.   -Anatomy: Bilateral renal atrophy. Right renal mass is exophytic and involving the anterolateral aspects of the midpole. No abnormality seen involving the left kidney  -Personal/family history of GU malignancies: denies  -Smoking history: Previously smoked 1-2 ppd for 38 years. He now smokes off/on--states that he quit last week  -Prior abdominal surgeries: Open appendectomy at age 51  -Renal function: BMP from 09/14/2022 revealed a serum creatinine of 0.9 and an eGFR of 95.  -History of kidney stones: denies  -He denies interval episodes of flank pain or hematuria.   Cardiologist: Peter Swazil[place], MD   12/04/2022: Patient here today for preoperative appointment prior to undergoing right robotic assisted partial nephrectomy with Dr. Liliane Munoz on 10/25. Overall doing well. Denies changes in past medical history, prescription medications taken on a daily basis, no interval surgical or procedural intervention. Voiding symptoms remain grossly stable with continued use of daily tamsulosin. He has had no interval dysuria or gross hematuria. Denies unilateral lower back or flank pain/discomfort suggestive of obstructive uropathy. He is  tolerating a normal diet without nausea or vomiting. Denies any recent fevers or chills, chest pain or shortness of breath.     ALLERGIES: No Allergies    MEDICATIONS: Metoprolol Tartrate 25 mg tablet  Tamsulosin Hcl 0.4 mg capsule TAKE 1 CAPSULE BY MOUTH DAILY  Viagra 100 mg tablet 1 tablet PO Daily PRN take one hour before intercourse  Aspir 81  Atorvastatin Calcium 10 mg tablet  Buspirone Hcl 10 mg tablet  Celebrex PRN  Escitalopram Oxalate 10 mg tablet  Pantoprazole Sodium 40 mg tablet, delayed release  Repatha Syringe 140 mg/ml syringe     GU PSH: Cystoscopy - 2020       PSH Notes: Hand Surgery, Appendectomy, Elbow Surgery, Cath Stent Placement, Ankle Surgery, Ankle Repair, Cardiac Cath Stent Placement, Elbow Surgery Repair   NON-GU PSH: Appendectomy - 2013, 2009 Carpal tunnel surgery - 2000 Elbow Arthroscopy - 2000 Neck Surgery - 2018     GU PMH: Right renal neoplasm - 10/27/2022, - 10/15/2022 BPH w/LUTS - 2020 Urinary Urgency - 2020 Gross hematuria - 2020 ED due to arterial insufficiency - 2018, Erectile dysfunction due to arterial insufficiency, - 2014 Nocturia - 2018 Hematospermia, Hematospermia - 2014 Low back pain, Lower back pain - 2014 Testicular pain, unspecified, Testicular pain - 2014      PMH Notes: Cardiovascular and Mediastinum  Coronary artery disease  Angina pectoris (HCC)   Respiratory  Reactive airway disease with wheezing  COPD (chronic obstructive pulmonary disease) with acute bronchitis (HCC)  Obstructive sleep apnea treated with continuous positive airway pressure (CPAP)   Digestive  ESOPHAGITIS, REFLUX  GASTROESOPHAGEAL REFLUX DISEASE  GASTRIC ULCER, ACUTE  ACUT DUOD ULCER W/O MENTION HEMORR PERF/OBST  Chronic diarrhea   Nervous  and Auditory  Bilateral hearing loss  Otorrhea, right   Musculoskeletal and Integument  Trochanteric bursitis of right hip   Other  Hypercholesteremia  Hypersomnia with sleep apnea  Insomnia due to  medical condition  Hypersomnia, persistent  Nocturia more than twice per night  Abnormal nuclear stress test  Status post coronary artery stent placement      NON-GU PMH: Personal history of other diseases of the circulatory system, History of cardiac disorder - 2014 Personal history of other diseases of the digestive system, History of esophageal reflux - 2014 Personal history of other diseases of the nervous system and sense organs, History of sleep apnea - 2014 Personal history of other endocrine, nutritional and metabolic disease, History of hyperlipidemia - 2014 Personal history of other specified conditions, History of heartburn - 2014 Abdominal aortic aneurysm Arthritis GERD Hypercholesterolemia Hypertension Myocardial Infarction Sleep Apnea    FAMILY HISTORY: 2 sons - Son Family Health Status - Mother's Age - Runs In Family Family Health Status Father - Runs In Family Gallbladder Surgery - Father Hypertension - Son Intracranial Aneurysm Repair - Father   SOCIAL HISTORY: Marital Status: Married Preferred Language: English; Ethnicity: Not Hispanic Or Latino; Race: White Current Smoking Status: Patient does not smoke anymore.   Tobacco Use Assessment Completed: Used Tobacco in last 30 days? Drinks 3 drinks per day. Social Drinker.  Drinks 1 caffeinated drink per day.     Notes: Former smoker, Caffeine Use, Occupation:, Alcohol Use, Previous History Of Smoking, Caffeine Use   REVIEW OF SYSTEMS:    GU Review Male:   Patient reports get up at night to urinate. Patient denies frequent urination, hard to postpone urination, burning/ pain with urination, leakage of urine, stream starts and stops, trouble starting your stream, have to strain to urinate , erection problems, and penile pain.  Gastrointestinal (Upper):   Patient denies nausea, vomiting, and indigestion/ heartburn.  Gastrointestinal (Lower):   Patient denies diarrhea and constipation.  Constitutional:   Patient  denies fever, night sweats, weight loss, and fatigue.  Skin:   Patient denies skin rash/ lesion and itching.  Eyes:   Patient denies blurred vision and double vision.  Ears/ Nose/ Throat:   Patient denies sore throat and sinus problems.  Hematologic/Lymphatic:   Patient denies swollen glands and easy bruising.  Cardiovascular:   Patient denies leg swelling and chest pains.  Respiratory:   Patient denies cough and shortness of breath.  Endocrine:   Patient denies excessive thirst.  Musculoskeletal:   Patient denies joint pain and back pain.  Neurological:   Patient denies headaches and dizziness.  Psychologic:   Patient denies depression and anxiety.   Notes: Updated from previous visit 10/15/2022 with review from patient as noted above.   VITAL SIGNS:      12/04/2022 12:43 PM  Weight 185 lb / 83.91 kg  Height 66 in / 167.64 cm  BP 92/63 mmHg  Pulse 77 /min  BMI 29.9 kg/m   MULTI-SYSTEM PHYSICAL EXAMINATION:    Constitutional: Well-nourished. No physical deformities. Normally developed. Good grooming.  Neck: Neck symmetrical, not swollen. Normal tracheal position.  Respiratory: No labored breathing, no use of accessory muscles.   Cardiovascular: Normal temperature, normal extremity pulses, no swelling, no varicosities.  Skin: No paleness, no jaundice, no cyanosis. No lesion, no ulcer, no rash.  Neurologic / Psychiatric: Oriented to time, oriented to place, oriented to person. No depression, no anxiety, no agitation.  Gastrointestinal: No mass, no tenderness, no rigidity, non obese abdomen.  Musculoskeletal: Normal gait and station of head and neck.     Complexity of Data:  Source Of History:  Patient, Family/Caregiver, Medical Record Summary  Records Review:   Previous Doctor Records, Previous Hospital Records, Previous Patient Records  Urine Test Review:   Urinalysis  X-Ray Review: C.T. Chest/ Abd/Pelvis: Reviewed Films. Reviewed Report.     12/29/18  PSA  Total PSA 2.42  ng/mL    12/04/22  Urinalysis  Urine Appearance Clear   Urine Color Yellow   Urine Glucose Neg mg/dL  Urine Bilirubin Neg mg/dL  Urine Ketones Neg mg/dL  Urine Specific Gravity 1.015   Urine Blood Neg ery/uL  Urine pH 6.0   Urine Protein Neg mg/dL  Urine Urobilinogen 0.2 mg/dL  Urine Nitrites Neg   Urine Leukocyte Esterase Neg leu/uL   PROCEDURES:          Visit Complexity - G2211          Urinalysis Dipstick Dipstick Cont'd  Color: Yellow Bilirubin: Neg mg/dL  Appearance: Clear Ketones: Neg mg/dL  Specific Gravity: 1.610 Blood: Neg ery/uL  pH: 6.0 Protein: Neg mg/dL  Glucose: Neg mg/dL Urobilinogen: 0.2 mg/dL    Nitrites: Neg    Leukocyte Esterase: Neg leu/uL    ASSESSMENT:      ICD-10 Details  1 GU:   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 We discussed that the mass in question has features concerning for malignancy. I explained the natural history of presumed renal cell carcinoma. I reviewed the AUA guidelines for evaluation and treatment of the small renal mass. The options of active surveillance, in situ tumor ablation, partial and radical nephrectomy was discussed. The risks of robot-assisted RIGHT partial nephrectomy were discussed in detail including but not limited to: negative pathology, open conversion, completion nephrectomy, infection of the urinary tract/skin/abdominal cavity, VTE, MI/CVA, lymphatic leak, injury to adjacent solid/hollow viscus organs, bleeding requiring a blood transfusion, catastrophic bleeding, hernia formation, need for postoperative angioembolization, urinary leak requiring stent/drain, and  other imponderables.

## 2022-12-11 ENCOUNTER — Other Ambulatory Visit: Payer: Self-pay

## 2022-12-11 ENCOUNTER — Inpatient Hospital Stay (HOSPITAL_COMMUNITY): Payer: Medicare HMO | Admitting: Physician Assistant

## 2022-12-11 ENCOUNTER — Encounter (HOSPITAL_COMMUNITY): Payer: Self-pay | Admitting: Urology

## 2022-12-11 ENCOUNTER — Encounter (HOSPITAL_COMMUNITY)
Admission: RE | Disposition: A | Discharge status: Home / Self Care | Payer: Self-pay | Source: Home / Self Care | Attending: Urology

## 2022-12-11 ENCOUNTER — Inpatient Hospital Stay (HOSPITAL_COMMUNITY)
Admission: RE | Admit: 2022-12-11 | Discharge: 2022-12-12 | DRG: 657 | Disposition: A | Discharge status: Home / Self Care | Payer: Medicare HMO | Attending: Urology | Admitting: Urology

## 2022-12-11 ENCOUNTER — Inpatient Hospital Stay (HOSPITAL_COMMUNITY): Payer: Medicare HMO | Admitting: Anesthesiology

## 2022-12-11 DIAGNOSIS — I440 Atrioventricular block, first degree: Secondary | ICD-10-CM | POA: Diagnosis not present

## 2022-12-11 DIAGNOSIS — N281 Cyst of kidney, acquired: Secondary | ICD-10-CM | POA: Diagnosis not present

## 2022-12-11 DIAGNOSIS — C641 Malignant neoplasm of right kidney, except renal pelvis: Principal | ICD-10-CM | POA: Diagnosis present

## 2022-12-11 DIAGNOSIS — Z955 Presence of coronary angioplasty implant and graft: Secondary | ICD-10-CM | POA: Diagnosis not present

## 2022-12-11 DIAGNOSIS — R079 Chest pain, unspecified: Secondary | ICD-10-CM | POA: Diagnosis not present

## 2022-12-11 DIAGNOSIS — N289 Disorder of kidney and ureter, unspecified: Secondary | ICD-10-CM | POA: Diagnosis not present

## 2022-12-11 DIAGNOSIS — I2510 Atherosclerotic heart disease of native coronary artery without angina pectoris: Secondary | ICD-10-CM | POA: Diagnosis not present

## 2022-12-11 DIAGNOSIS — G4733 Obstructive sleep apnea (adult) (pediatric): Secondary | ICD-10-CM | POA: Diagnosis not present

## 2022-12-11 DIAGNOSIS — J449 Chronic obstructive pulmonary disease, unspecified: Secondary | ICD-10-CM | POA: Diagnosis not present

## 2022-12-11 DIAGNOSIS — J9811 Atelectasis: Secondary | ICD-10-CM | POA: Diagnosis not present

## 2022-12-11 DIAGNOSIS — Z8711 Personal history of peptic ulcer disease: Secondary | ICD-10-CM

## 2022-12-11 DIAGNOSIS — N2889 Other specified disorders of kidney and ureter: Secondary | ICD-10-CM

## 2022-12-11 DIAGNOSIS — Z87891 Personal history of nicotine dependence: Secondary | ICD-10-CM | POA: Diagnosis not present

## 2022-12-11 DIAGNOSIS — R0789 Other chest pain: Secondary | ICD-10-CM | POA: Diagnosis not present

## 2022-12-11 DIAGNOSIS — Z8249 Family history of ischemic heart disease and other diseases of the circulatory system: Secondary | ICD-10-CM

## 2022-12-11 DIAGNOSIS — R0902 Hypoxemia: Secondary | ICD-10-CM | POA: Diagnosis not present

## 2022-12-11 DIAGNOSIS — K219 Gastro-esophageal reflux disease without esophagitis: Secondary | ICD-10-CM | POA: Diagnosis not present

## 2022-12-11 DIAGNOSIS — Q2546 Tortuous aortic arch: Secondary | ICD-10-CM

## 2022-12-11 DIAGNOSIS — I252 Old myocardial infarction: Secondary | ICD-10-CM | POA: Diagnosis not present

## 2022-12-11 DIAGNOSIS — R0989 Other specified symptoms and signs involving the circulatory and respiratory systems: Secondary | ICD-10-CM | POA: Diagnosis not present

## 2022-12-11 DIAGNOSIS — H9193 Unspecified hearing loss, bilateral: Secondary | ICD-10-CM | POA: Diagnosis not present

## 2022-12-11 DIAGNOSIS — Z79899 Other long term (current) drug therapy: Secondary | ICD-10-CM | POA: Diagnosis not present

## 2022-12-11 DIAGNOSIS — D49511 Neoplasm of unspecified behavior of right kidney: Secondary | ICD-10-CM | POA: Diagnosis not present

## 2022-12-11 DIAGNOSIS — I10 Essential (primary) hypertension: Secondary | ICD-10-CM | POA: Diagnosis not present

## 2022-12-11 HISTORY — PX: ROBOTIC ASSITED PARTIAL NEPHRECTOMY: SHX6087

## 2022-12-11 LAB — HEMOGLOBIN AND HEMATOCRIT, BLOOD
HCT: 42.5 % (ref 39.0–52.0)
Hemoglobin: 13.9 g/dL (ref 13.0–17.0)

## 2022-12-11 LAB — TYPE AND SCREEN
ABO/RH(D): A POS
Antibody Screen: NEGATIVE

## 2022-12-11 LAB — ABO/RH: ABO/RH(D): A POS

## 2022-12-11 SURGERY — NEPHRECTOMY, PARTIAL, ROBOT-ASSISTED
Anesthesia: General | Laterality: Right

## 2022-12-11 MED ORDER — OXYCODONE HCL 5 MG PO TABS: 5.0000 mg | ORAL_TABLET | ORAL | 0 refills | Status: AC | PRN

## 2022-12-11 MED ORDER — FENTANYL CITRATE PF 50 MCG/ML IJ SOSY
PREFILLED_SYRINGE | INTRAMUSCULAR | Status: AC
  Filled 2022-12-11: qty 1

## 2022-12-11 MED ORDER — DIPHENHYDRAMINE HCL 12.5 MG/5ML PO ELIX
12.5000 mg | ORAL_SOLUTION | Freq: Four times a day (QID) | ORAL | Status: DC | PRN
Start: 2022-12-11 — End: 2022-12-12

## 2022-12-11 MED ORDER — HYDROMORPHONE HCL 1 MG/ML IJ SOLN
INTRAMUSCULAR | Status: DC | PRN
  Administered 2022-12-11: .6 mg via INTRAVENOUS
  Administered 2022-12-11 (×3): .4 mg via INTRAVENOUS
  Administered 2022-12-11: .2 mg via INTRAVENOUS

## 2022-12-11 MED ORDER — LABETALOL HCL 5 MG/ML IV SOLN
INTRAVENOUS | Status: DC | PRN
Start: 2022-12-11 — End: 2022-12-11
  Administered 2022-12-11: 2.5 mg via INTRAVENOUS
  Administered 2022-12-11 (×2): 5 mg via INTRAVENOUS
  Administered 2022-12-11: 2.5 mg via INTRAVENOUS

## 2022-12-11 MED ORDER — BUSPIRONE HCL 5 MG PO TABS
5.0000 mg | ORAL_TABLET | Freq: Every day | ORAL | Status: DC
  Administered 2022-12-11: 5 mg via ORAL
  Filled 2022-12-11: qty 1

## 2022-12-11 MED ORDER — FLUTICASONE FUROATE-VILANTEROL 100-25 MCG/ACT IN AEPB: 1.0000 | INHALATION_SPRAY | Freq: Every day | RESPIRATORY_TRACT | Status: DC | PRN

## 2022-12-11 MED ORDER — ONDANSETRON HCL 4 MG/2ML IJ SOLN
INTRAMUSCULAR | Status: AC
  Filled 2022-12-11: qty 2

## 2022-12-11 MED ORDER — CHLORHEXIDINE GLUCONATE 0.12 % MT SOLN
15.0000 mL | Freq: Once | OROMUCOSAL | Status: AC
  Administered 2022-12-11: 15 mL via OROMUCOSAL

## 2022-12-11 MED ORDER — ESCITALOPRAM OXALATE 10 MG PO TABS
10.0000 mg | ORAL_TABLET | Freq: Every day | ORAL | Status: DC
Start: 2022-12-12 — End: 2022-12-12
  Administered 2022-12-12: 10 mg via ORAL
  Filled 2022-12-11: qty 1

## 2022-12-11 MED ORDER — DOCUSATE SODIUM 100 MG PO CAPS: 100.0000 mg | ORAL_CAPSULE | Freq: Two times a day (BID) | ORAL | Status: AC

## 2022-12-11 MED ORDER — HYDROMORPHONE HCL 1 MG/ML IJ SOLN
0.5000 mg | INTRAMUSCULAR | Status: DC | PRN
  Administered 2022-12-12 (×3): 1 mg via INTRAVENOUS
  Administered 2022-12-12: 0.5 mg via INTRAVENOUS
  Filled 2022-12-11 (×4): qty 1

## 2022-12-11 MED ORDER — PROPOFOL 10 MG/ML IV BOLUS
INTRAVENOUS | Status: DC | PRN
  Administered 2022-12-11: 200 mg via INTRAVENOUS

## 2022-12-11 MED ORDER — LACTATED RINGERS IV SOLN: INTRAVENOUS | Status: DC

## 2022-12-11 MED ORDER — FENTANYL CITRATE (PF) 250 MCG/5ML IJ SOLN
INTRAMUSCULAR | Status: AC
  Filled 2022-12-11: qty 5

## 2022-12-11 MED ORDER — DIPHENHYDRAMINE HCL 50 MG/ML IJ SOLN: 12.5000 mg | Freq: Four times a day (QID) | INTRAMUSCULAR | Status: DC | PRN

## 2022-12-11 MED ORDER — METOPROLOL SUCCINATE ER 25 MG PO TB24
25.0000 mg | ORAL_TABLET | Freq: Every evening | ORAL | Status: DC
  Administered 2022-12-11: 25 mg via ORAL
  Filled 2022-12-11: qty 1

## 2022-12-11 MED ORDER — SUGAMMADEX SODIUM 200 MG/2ML IV SOLN
INTRAVENOUS | Status: DC | PRN
  Administered 2022-12-11: 200 mg via INTRAVENOUS

## 2022-12-11 MED ORDER — FENTANYL CITRATE PF 50 MCG/ML IJ SOSY
25.0000 ug | PREFILLED_SYRINGE | INTRAMUSCULAR | Status: DC | PRN
  Administered 2022-12-11: 50 ug via INTRAVENOUS

## 2022-12-11 MED ORDER — ALBUMIN HUMAN 5 % IV SOLN: INTRAVENOUS | Status: DC | PRN

## 2022-12-11 MED ORDER — PHENYLEPHRINE 80 MCG/ML (10ML) SYRINGE FOR IV PUSH (FOR BLOOD PRESSURE SUPPORT)
PREFILLED_SYRINGE | INTRAVENOUS | Status: AC
  Filled 2022-12-11: qty 10

## 2022-12-11 MED ORDER — HYOSCYAMINE SULFATE 0.125 MG SL SUBL: 0.1250 mg | SUBLINGUAL_TABLET | SUBLINGUAL | Status: DC | PRN

## 2022-12-11 MED ORDER — MIDAZOLAM HCL 2 MG/2ML IJ SOLN
INTRAMUSCULAR | Status: DC | PRN
  Administered 2022-12-11: 2 mg via INTRAVENOUS

## 2022-12-11 MED ORDER — FENTANYL CITRATE (PF) 250 MCG/5ML IJ SOLN
INTRAMUSCULAR | Status: DC | PRN
  Administered 2022-12-11 (×3): 50 ug via INTRAVENOUS
  Administered 2022-12-11: 100 ug via INTRAVENOUS
  Administered 2022-12-11 (×5): 50 ug via INTRAVENOUS

## 2022-12-11 MED ORDER — PHENYLEPHRINE HCL-NACL 20-0.9 MG/250ML-% IV SOLN
INTRAVENOUS | Status: DC | PRN
  Administered 2022-12-11: 10 ug/min via INTRAVENOUS

## 2022-12-11 MED ORDER — DEXTROSE-SODIUM CHLORIDE 5-0.45 % IV SOLN: INTRAVENOUS | Status: DC

## 2022-12-11 MED ORDER — PROPOFOL 10 MG/ML IV BOLUS
INTRAVENOUS | Status: AC
  Filled 2022-12-11: qty 20

## 2022-12-11 MED ORDER — ALBUMIN HUMAN 5 % IV SOLN
INTRAVENOUS | Status: AC
  Filled 2022-12-11: qty 500

## 2022-12-11 MED ORDER — SODIUM CHLORIDE (PF) 0.9 % IJ SOLN
INTRAMUSCULAR | Status: DC | PRN
  Administered 2022-12-11: 20 mL

## 2022-12-11 MED ORDER — ACETAMINOPHEN 500 MG PO TABS
500.0000 mg | ORAL_TABLET | Freq: Four times a day (QID) | ORAL | 0 refills | Status: AC | PRN
Start: 2022-12-11 — End: 2023-12-11

## 2022-12-11 MED ORDER — ACETAMINOPHEN 500 MG PO TABS
1000.0000 mg | ORAL_TABLET | Freq: Four times a day (QID) | ORAL | Status: AC
  Administered 2022-12-11 – 2022-12-12 (×4): 1000 mg via ORAL
  Filled 2022-12-11 (×4): qty 2

## 2022-12-11 MED ORDER — OXYCODONE HCL 5 MG PO TABS
5.0000 mg | ORAL_TABLET | ORAL | Status: DC | PRN
  Administered 2022-12-12 (×3): 5 mg via ORAL
  Filled 2022-12-11 (×3): qty 1

## 2022-12-11 MED ORDER — CEFAZOLIN SODIUM-DEXTROSE 2-4 GM/100ML-% IV SOLN
2.0000 g | INTRAVENOUS | Status: AC
  Administered 2022-12-11: 2 g via INTRAVENOUS
  Filled 2022-12-11: qty 100

## 2022-12-11 MED ORDER — BUPIVACAINE LIPOSOME 1.3 % IJ SUSP
INTRAMUSCULAR | Status: DC | PRN
  Administered 2022-12-11: 20 mL

## 2022-12-11 MED ORDER — PHENYLEPHRINE HCL-NACL 20-0.9 MG/250ML-% IV SOLN
INTRAVENOUS | Status: AC
  Filled 2022-12-11: qty 250

## 2022-12-11 MED ORDER — LIDOCAINE 2% (20 MG/ML) 5 ML SYRINGE
INTRAMUSCULAR | Status: DC | PRN
  Administered 2022-12-11: 60 mg via INTRAVENOUS

## 2022-12-11 MED ORDER — HYDROMORPHONE HCL 2 MG/ML IJ SOLN
INTRAMUSCULAR | Status: AC
  Filled 2022-12-11: qty 1

## 2022-12-11 MED ORDER — OXYCODONE HCL 5 MG PO TABS
5.0000 mg | ORAL_TABLET | Freq: Once | ORAL | Status: AC | PRN
  Administered 2022-12-11: 5 mg via ORAL

## 2022-12-11 MED ORDER — ONDANSETRON HCL 4 MG/2ML IJ SOLN: 4.0000 mg | Freq: Four times a day (QID) | INTRAMUSCULAR | Status: DC | PRN

## 2022-12-11 MED ORDER — SODIUM CHLORIDE (PF) 0.9 % IJ SOLN
INTRAMUSCULAR | Status: AC
  Filled 2022-12-11: qty 10

## 2022-12-11 MED ORDER — ONDANSETRON HCL 4 MG/2ML IJ SOLN
INTRAMUSCULAR | Status: DC | PRN
  Administered 2022-12-11: 4 mg via INTRAVENOUS

## 2022-12-11 MED ORDER — SODIUM CHLORIDE 0.9 % IV SOLN: INTRAVENOUS | Status: DC | PRN

## 2022-12-11 MED ORDER — ORAL CARE MOUTH RINSE: 15.0000 mL | Freq: Once | OROMUCOSAL | Status: AC

## 2022-12-11 MED ORDER — ROCURONIUM BROMIDE 10 MG/ML (PF) SYRINGE
PREFILLED_SYRINGE | INTRAVENOUS | Status: AC
  Filled 2022-12-11: qty 10

## 2022-12-11 MED ORDER — SODIUM CHLORIDE (PF) 0.9 % IJ SOLN
INTRAMUSCULAR | Status: AC
Start: 2022-12-11 — End: ?
  Filled 2022-12-11: qty 10

## 2022-12-11 MED ORDER — DEXAMETHASONE SODIUM PHOSPHATE 10 MG/ML IJ SOLN
INTRAMUSCULAR | Status: DC | PRN
  Administered 2022-12-11: 10 mg via INTRAVENOUS

## 2022-12-11 MED ORDER — ONDANSETRON HCL 4 MG/2ML IJ SOLN: 4.0000 mg | INTRAMUSCULAR | Status: DC | PRN

## 2022-12-11 MED ORDER — LABETALOL HCL 5 MG/ML IV SOLN
INTRAVENOUS | Status: AC
Start: 2022-12-11 — End: ?
  Filled 2022-12-11: qty 4

## 2022-12-11 MED ORDER — LIDOCAINE HCL (PF) 2 % IJ SOLN
INTRAMUSCULAR | Status: AC
  Filled 2022-12-11: qty 5

## 2022-12-11 MED ORDER — TAMSULOSIN HCL 0.4 MG PO CAPS
0.4000 mg | ORAL_CAPSULE | Freq: Every evening | ORAL | Status: DC
  Administered 2022-12-11: 0.4 mg via ORAL
  Filled 2022-12-11: qty 1

## 2022-12-11 MED ORDER — UMECLIDINIUM BROMIDE 62.5 MCG/ACT IN AEPB: 1.0000 | INHALATION_SPRAY | Freq: Every day | RESPIRATORY_TRACT | Status: DC | PRN

## 2022-12-11 MED ORDER — SODIUM CHLORIDE (PF) 0.9 % IJ SOLN
INTRAMUSCULAR | Status: AC
  Filled 2022-12-11: qty 20

## 2022-12-11 MED ORDER — ATORVASTATIN CALCIUM 40 MG PO TABS
40.0000 mg | ORAL_TABLET | Freq: Every day | ORAL | Status: DC
  Administered 2022-12-12: 40 mg via ORAL
  Filled 2022-12-11: qty 1

## 2022-12-11 MED ORDER — ROCURONIUM BROMIDE 10 MG/ML (PF) SYRINGE
PREFILLED_SYRINGE | INTRAVENOUS | Status: DC | PRN
  Administered 2022-12-11: 20 mg via INTRAVENOUS
  Administered 2022-12-11: 10 mg via INTRAVENOUS
  Administered 2022-12-11: 20 mg via INTRAVENOUS
  Administered 2022-12-11: 60 mg via INTRAVENOUS

## 2022-12-11 MED ORDER — BUPIVACAINE LIPOSOME 1.3 % IJ SUSP
INTRAMUSCULAR | Status: AC
  Filled 2022-12-11: qty 20

## 2022-12-11 MED ORDER — OXYCODONE HCL 5 MG/5ML PO SOLN: 5.0000 mg | Freq: Once | ORAL | Status: AC | PRN

## 2022-12-11 MED ORDER — OXYCODONE HCL 5 MG PO TABS
ORAL_TABLET | ORAL | Status: AC
  Filled 2022-12-11: qty 1

## 2022-12-11 MED ORDER — VISTASEAL 4 ML SINGLE DOSE KIT
4.0000 mL | PACK | Freq: Once | CUTANEOUS | Status: AC
  Administered 2022-12-11: 4 mL via TOPICAL
  Filled 2022-12-11: qty 4

## 2022-12-11 MED ORDER — PHENYLEPHRINE 80 MCG/ML (10ML) SYRINGE FOR IV PUSH (FOR BLOOD PRESSURE SUPPORT)
PREFILLED_SYRINGE | INTRAVENOUS | Status: DC | PRN
Start: 2022-12-11 — End: 2022-12-11
  Administered 2022-12-11: 80 ug via INTRAVENOUS

## 2022-12-11 MED ORDER — PANTOPRAZOLE SODIUM 40 MG PO TBEC
40.0000 mg | DELAYED_RELEASE_TABLET | Freq: Two times a day (BID) | ORAL | Status: DC
  Administered 2022-12-11 – 2022-12-12 (×2): 40 mg via ORAL
  Filled 2022-12-11 (×2): qty 1

## 2022-12-11 MED ORDER — DEXAMETHASONE SODIUM PHOSPHATE 10 MG/ML IJ SOLN
INTRAMUSCULAR | Status: AC
  Filled 2022-12-11: qty 1

## 2022-12-11 MED ORDER — DOCUSATE SODIUM 100 MG PO CAPS
100.0000 mg | ORAL_CAPSULE | Freq: Two times a day (BID) | ORAL | Status: DC
  Administered 2022-12-11 – 2022-12-12 (×2): 100 mg via ORAL
  Filled 2022-12-11 (×2): qty 1

## 2022-12-11 MED ORDER — MIDAZOLAM HCL 2 MG/2ML IJ SOLN
INTRAMUSCULAR | Status: AC
  Filled 2022-12-11: qty 2

## 2022-12-11 SURGICAL SUPPLY — 74 items
ADH SKN CLS APL DERMABOND .7 (GAUZE/BANDAGES/DRESSINGS) ×1
AGENT HMST KT MTR STRL THRMB (HEMOSTASIS)
APL ESCP 34 STRL LF DISP (HEMOSTASIS)
APL LAPSCP 35 DL APL RGD (MISCELLANEOUS) ×1
APL PRP STRL LF DISP 70% ISPRP (MISCELLANEOUS) ×1
APPLICATOR SURGIFLO ENDO (HEMOSTASIS) IMPLANT
APPLICATOR VISTASEAL 35 (MISCELLANEOUS) ×1 IMPLANT
CAUTERY HOOK MNPLR 1.6 DVNC XI (INSTRUMENTS) ×1 IMPLANT
CHLORAPREP W/TINT 26 (MISCELLANEOUS) ×1 IMPLANT
CLIP LIGATING HEM O LOK PURPLE (MISCELLANEOUS) ×2 IMPLANT
CLIP LIGATING HEMO LOK XL GOLD (MISCELLANEOUS) IMPLANT
CLIP LIGATING HEMO O LOK GREEN (MISCELLANEOUS) IMPLANT
CLIP SUT LAPRA TY ABSORB (SUTURE) ×2 IMPLANT
COVER SURGICAL LIGHT HANDLE (MISCELLANEOUS) ×1 IMPLANT
COVER TIP SHEARS 8 DVNC (MISCELLANEOUS) ×1 IMPLANT
DERMABOND ADVANCED .7 DNX12 (GAUZE/BANDAGES/DRESSINGS) ×1 IMPLANT
DRAIN CHANNEL 15F RND FF 3/16 (WOUND CARE) IMPLANT
DRAPE ARM DVNC X/XI (DISPOSABLE) ×4 IMPLANT
DRAPE COLUMN DVNC XI (DISPOSABLE) ×1 IMPLANT
DRAPE INCISE IOBAN 66X45 STRL (DRAPES) ×1 IMPLANT
DRAPE SHEET LG 3/4 BI-LAMINATE (DRAPES) ×1 IMPLANT
DRIVER NDL LRG 8 DVNC XI (INSTRUMENTS) ×3 IMPLANT
DRIVER NDLE LRG 8 DVNC XI (INSTRUMENTS) ×2
ELECT PENCIL ROCKER SW 15FT (MISCELLANEOUS) ×1 IMPLANT
ELECT REM PT RETURN 15FT ADLT (MISCELLANEOUS) ×1 IMPLANT
EVACUATOR SILICONE 100CC (DRAIN) IMPLANT
FORCEPS PROGRASP DVNC XI (FORCEP) ×2 IMPLANT
GAUZE 4X4 16PLY ~~LOC~~+RFID DBL (SPONGE) IMPLANT
GLOVE BIO SURGEON STRL SZ 6.5 (GLOVE) ×1 IMPLANT
GLOVE BIOGEL PI IND STRL 8 (GLOVE) ×1 IMPLANT
GLOVE SURG LX STRL 7.5 STRW (GLOVE) ×2 IMPLANT
GOWN STRL REUS W/ TWL XL LVL3 (GOWN DISPOSABLE) ×2 IMPLANT
GOWN STRL REUS W/TWL XL LVL3 (GOWN DISPOSABLE) ×2
GOWN STRL SURGICAL XL XLNG (GOWN DISPOSABLE) ×1 IMPLANT
HEMOSTAT SURGICEL 4X8 (HEMOSTASIS) ×1 IMPLANT
HOLDER FOLEY CATH W/STRAP (MISCELLANEOUS) ×1 IMPLANT
IRRIG SUCT STRYKERFLOW 2 WTIP (MISCELLANEOUS) ×1
IRRIGATION SUCT STRKRFLW 2 WTP (MISCELLANEOUS) ×1 IMPLANT
KIT BASIN OR (CUSTOM PROCEDURE TRAY) ×1 IMPLANT
KIT TURNOVER KIT A (KITS) IMPLANT
LOOP VESSEL MAXI BLUE (MISCELLANEOUS) IMPLANT
MARKER SKIN DUAL TIP RULER LAB (MISCELLANEOUS) ×1 IMPLANT
NDL INSUFFLATION 14GA 120MM (NEEDLE) ×1 IMPLANT
NEEDLE INSUFFLATION 14GA 120MM (NEEDLE) ×1
PROTECTOR NERVE ULNAR (MISCELLANEOUS) ×2 IMPLANT
RELOAD STAPLE 45 2.6 WHT THIN (STAPLE) IMPLANT
SCISSORS LAP 5X45 EPIX DISP (ENDOMECHANICALS) ×1 IMPLANT
SCISSORS MNPLR CVD DVNC XI (INSTRUMENTS) ×1 IMPLANT
SEAL UNIV 5-12 XI (MISCELLANEOUS) ×4 IMPLANT
SET TUBE SMOKE EVAC HIGH FLOW (TUBING) ×1 IMPLANT
SOL ELECTROSURG ANTI STICK (MISCELLANEOUS) ×1
SOLUTION ELECTROSURG ANTI STCK (MISCELLANEOUS) ×1 IMPLANT
SPIKE FLUID TRANSFER (MISCELLANEOUS) ×1 IMPLANT
STAPLE RELOAD 45 WHT (STAPLE)
STAPLER POWER ECHELON 45 WIDE (STAPLE) IMPLANT
SURGIFLO W/THROMBIN 8M KIT (HEMOSTASIS) IMPLANT
SUT ETHILON 2 0 PS N (SUTURE) IMPLANT
SUT MNCRL AB 4-0 PS2 18 (SUTURE) ×2 IMPLANT
SUT PDS AB 0 CT1 36 (SUTURE) IMPLANT
SUT V-LOC BARB 180 2/0GR6 GS22 (SUTURE)
SUT VIC AB 1 CT1 36 (SUTURE) ×4 IMPLANT
SUT VICRYL 0 UR6 27IN ABS (SUTURE) IMPLANT
SUT VLOC 3-0 9IN GRN (SUTURE) ×1 IMPLANT
SUT VLOC BARB 180 ABS3/0GR12 (SUTURE) ×2
SUTURE V-LC BRB 180 2/0GR6GS22 (SUTURE) IMPLANT
SUTURE VLOC BRB 180 ABS3/0GR12 (SUTURE) ×1 IMPLANT
SYS BAG RETRIEVAL 10MM (BASKET) ×1
SYSTEM BAG RETRIEVAL 10MM (BASKET) ×1 IMPLANT
TOWEL OR 17X26 10 PK STRL BLUE (TOWEL DISPOSABLE) ×1 IMPLANT
TRAY FOLEY MTR SLVR 16FR STAT (SET/KITS/TRAYS/PACK) ×1 IMPLANT
TRAY LAPAROSCOPIC (CUSTOM PROCEDURE TRAY) ×1 IMPLANT
TROCAR Z THREAD OPTICAL 12X100 (TROCAR) ×1 IMPLANT
TROCAR Z-THREAD OPTICAL 5X100M (TROCAR) IMPLANT
WATER STERILE IRR 1000ML POUR (IV SOLUTION) ×1 IMPLANT

## 2022-12-11 NOTE — Anesthesia Procedure Notes (Signed)
Arterial Line Insertion Start/End10/25/2024 12:05 PM, 12/11/2022 12:10 PM Performed by: Achille Rich, MD, Florene Route, CRNA, CRNA  Patient location: Pre-op. Preanesthetic checklist: patient identified, IV checked, site marked, risks and benefits discussed, surgical consent, monitors and equipment checked, pre-op evaluation, timeout performed and anesthesia consent Lidocaine 1% used for infiltration Left, radial was placed Catheter size: 20 G Hand hygiene performed  and maximum sterile barriers used  Allen's test indicative of satisfactory collateral circulation Attempts: 1 Procedure performed without using ultrasound guided technique. Following insertion, dressing applied and Biopatch. Post procedure assessment: normal and unchanged  Patient tolerated the procedure well with no immediate complications.

## 2022-12-11 NOTE — Plan of Care (Signed)
 [facility] Tonsillectomy/Adenoidectomy, Postoperative PEDS care plan entered in error.

## 2022-12-11 NOTE — Discharge Instructions (Signed)
Activity:  You are encouraged to ambulate frequently (about every hour during waking hours) to help prevent blood clots from forming in your legs or lungs.  However, you should not engage in any heavy lifting (> 10-15 lbs), strenuous activity, or straining. Diet: You should advance your diet as instructed by your physician.  It will be normal to have some bloating, nausea, and abdominal discomfort intermittently. Prescriptions:  You will be provided a prescription for pain medication to take as needed.  If your pain is not severe enough to require the prescription pain medication, you may take extra strength Tylenol instead which will have less side effects.  You should also take a prescribed stool softener to avoid straining with bowel movements as the prescription pain medication may constipate you. Incisions: You may remove your dressing bandages 48 hours after surgery if not removed in the hospital.  You will either have some small staples or special tissue glue at each of the incision sites. Once the bandages are removed (if present), the incisions may stay open to air.  You may start showering (but not soaking or bathing in water) the 2nd day after surgery and the incisions simply need to be patted dry after the shower.  No additional care is needed. What to call us about: You should call the office [phone]) if you develop fever > 101 or develop persistent vomiting.   You may resume Celebrex, aspirin, advil, aleve, vitamins, and supplements 7 days after surgery.

## 2022-12-11 NOTE — Transfer of Care (Signed)
Immediate Anesthesia Transfer of Care Note  Patient: Jason Munoz  Procedure(s) Performed: XI ROBOTIC ASSITED RIGHT PARTIAL NEPHRECTOMY (Right)  Patient Location: PACU  Anesthesia Type:General  Level of Consciousness: awake, alert , and oriented  Airway & Oxygen Therapy: Patient Spontanous Breathing and Patient connected to face mask oxygen  Post-op Assessment: Report given to RN and Post -op Vital signs reviewed and stable  Post vital signs: Reviewed and stable  Last Vitals:  Vitals Value Taken Time  BP 131/83 12/11/22 1620  Temp    Pulse 77 12/11/22 1621  Resp 12 12/11/22 1621  SpO2 98 % 12/11/22 1621  Vitals shown include unfiled device data.  Last Pain:  Vitals:   12/11/22 1100  TempSrc:   PainSc: 0-No pain      Patients Stated Pain Goal: 4 (12/11/22 1100)  Complications: No notable events documented.

## 2022-12-11 NOTE — Op Note (Signed)
Operative Note  Preoperative diagnosis:  1.  2.7 cm right renal mass  Postoperative diagnosis: 1.  2.7 cm right renal mass  Procedure(s): 1.  Robot-assisted laparoscopic right partial nephrectomy and right renal cyst decortication 2.  Intraoperative ultrasound of single retroperitoneal organ  Surgeon: Rhoderick Moody, MD  Assistants: Harrie Foreman, PA-C An assistant was required for this surgical procedure.  The duties of the assistant included but were not limited to suctioning, passing suture, camera manipulation, retraction.  This procedure would not be able to be performed without an Geophysicist/field seismologist.   Anesthesia:  General  Complications:  None  EBL: 150 mL  Specimens: 1.  Right renal mass 2.  Right renal cyst wall  Drains/Catheters: 1.  Foley catheter  Intraoperative findings:   Grossly negative margins following excision of right renal mass Right renorrhaphy was hemostatic at the conclusion of the case  Indication:  Jason Munoz is a 67 y.o. male with a solid enhancing right renal mass with features concerning for renal cell carcinoma.  He has been consented for the above procedures, voices understanding and wishes to proceed.  Description of procedure:  After informed consent was signed, the patient was taken back to the operating room and properly anesthetized.  The patient was then placed in the left lateral decubitus position with all pressure points padded.  The abdomen was then prepped and draped in the usual sterile fashion.  A time-out was then performed.    An 8 mm incision was then made lateral to the right rectus muscle at the level of the right 12th rib.  A Veress needle was then used to access the abdominal cavity.  A saline drop test showed no signs of obstruction and aspiration of the Veress needle revealed no blood or sucus.  The abdominal cavity was then insufflated to 15 mmHg.  An 8 mm robotic trocar was then atraumatically inserted into the abdominal  cavity.  The robotic camera was then inserted through the port and inspection of the abdominal cavity revealed no evidence of adjacent organ or vessel injury.  We then placed three additional 8 mm robotic ports and a 12 mm assistant port in such as fashion as to triangulate the right renal hilum.  The robot was then docked into postion.   Using a combination of blunt and cold scissors dissection, the hepatic attachments were released from the abdominal sidewall.  The white line of Toldt along the ascending colon was then incised, allowing [place] to reflect the colon medially and expose the anterior surface of the right kidney.  The duodenum was then Kocherized medially, which abruptly led [place] to identification of the inferior vena cava.    Once the colon was adequately mobilized, we moved to the lower pole and identified the gonadal vein and ureter.  The gonadal vein was then left running parallel to the vena cava and the right ureter was reflected anteriorly.  Using cautious cautery, the overlying perihilar attachments were then released.  This yielded visualization of the renal hilum, which included a single right renal vein and a single right renal artery.  The perilymphatic tissue surrounding the right renal artery were carefully released so that the right renal artery was fully encircled.    We next turned our attention to defatting the kidney.  An anterior incision along Gerota's fascia was created and the kidney was fully mobilized.   The renal mass is identified at the mid pole.  Using intraoperative ultrasound, the tumor was then carefully evaluated  and demonstrated heterogenous echogenicity compared to the rest of the renal parenchyma. The resection margin was marked to allow for wide excision of the renal mass.  There was a 4 cm renal cyst adjacent to his renal mass that was excised using electrocautery.  The cyst wall was sent for permanent section.  The renal hilum was once again identified and a  bulldog clamp was placed.    Using cold scissors, the right renal mass was then carefully excised, leaving a grossly negative margin.  Excision of the mass appeared complete with no tumor grossly remaining.  The tumor was then placed in the right lower quadrant, to be retrieved following repair of the renal defect.  A running 3-0 V lock suture was then used to reapproximate the resection bed.  Tension was placed with hemo-lock clips.   The right renal capsule was then reapproximated using a 0 Vicryl suture on a CT-1 needle in an interrupted fashion using hemo-lock clips as a buttress.  Lapra-Ty's were then placed on each of the interrupted sutures.  The bulldog was then released, which warm ischemia time totaled 17 minutes.   Once hemostasis was achieved and the renal bed was irrigated, Surgicel and Vistaseal was then placed over the renorrhaphy.  Gerota's fascia was then reapproximated with a running v-lok suture and the mesocolonic fat along the descending colon was then reapproximated to the right abdominal sidewall using Hem-o-lok clips.  The renal mass was retrieved and placed in an Endo Catch bag.  The mass was extracted through the 12 mm assistant port.  The fascia of the assistant port was then reapproximated with a 0 Vicryl suture.    The abdomen was desufflated with all ports removed.  The skin was then reapproximated using running Monocryl and dressed appropriately.  The patient was then replaced in the supine position and was awakened from anesthesia without complications.  Plan: Monitor on the floor overnight

## 2022-12-11 NOTE — Anesthesia Postprocedure Evaluation (Signed)
Anesthesia Post Note  Patient: Jason Munoz  Procedure(s) Performed: XI ROBOTIC ASSITED RIGHT PARTIAL NEPHRECTOMY (Right)     Patient location during evaluation: PACU Anesthesia Type: General Level of consciousness: awake and alert and oriented Pain management: pain level controlled Vital Signs Assessment: post-procedure vital signs reviewed and stable Respiratory status: spontaneous breathing, nonlabored ventilation and respiratory function stable Cardiovascular status: blood pressure returned to baseline and stable Postop Assessment: no apparent nausea or vomiting Anesthetic complications: no   No notable events documented.  Last Vitals:  Vitals:   12/11/22 1715 12/11/22 1730  BP: (!) 144/91 (!) 145/98  Pulse: 78 81  Resp: (!) 8 (!) 4  Temp:    SpO2: 94% 93%    Last Pain:  Vitals:   12/11/22 1645  TempSrc:   PainSc: 6                  Larayne Baxley A.

## 2022-12-11 NOTE — Anesthesia Procedure Notes (Signed)
Procedure Name: Intubation Date/Time: 12/11/2022 12:33 PM  Performed by: Florene Route, CRNAPre-anesthesia Checklist: Patient identified, Emergency Drugs available, Suction available and Patient being monitored Patient Re-evaluated:Patient Re-evaluated prior to induction Oxygen Delivery Method: Circle system utilized Preoxygenation: Pre-oxygenation with 100% oxygen Induction Type: IV induction Ventilation: Mask ventilation without difficulty Laryngoscope Size: Miller and 3 Grade View: Grade I Tube type: Oral Tube size: 8.0 mm Number of attempts: 1 Airway Equipment and Method: Stylet and Oral airway Placement Confirmation: ETT inserted through vocal cords under direct vision, positive ETCO2 and breath sounds checked- equal and bilateral Secured at: 22 cm Tube secured with: Tape Dental Injury: Teeth and Oropharynx as per pre-operative assessment

## 2022-12-11 NOTE — Progress Notes (Incomplete)
Day of Surgery Subjective: ***  Objective: Vital signs in last 24 hours: Temp:  [97.7 F (36.5 C)] 97.7 F (36.5 C) (10/25 1035) Pulse Rate:  [70-92] 81 (10/25 1730) Resp:  [4-19] 4 (10/25 1730) BP: (123-145)/(83-102) 145/98 (10/25 1730) SpO2:  [93 %-98 %] 93 % (10/25 1730) Arterial Line BP: (121-156)/(61-76) 149/76 (10/25 1730) Weight:  [83.9 kg] 83.9 kg (10/25 1035)  Intake/Output from previous day: No intake/output data recorded. Intake/Output this shift: Total I/O In: 2900 [I.V.:2300; IV Piggyback:600] Out: 350 [Urine:200; Blood:150]  Physical Exam:  General: Alert and oriented CV: RRR Lungs: Clear Abdomen: Soft, ND, ATTP; inc c/d/I*** Ext: NT, No erythema  Lab Results: No results for input(s): "HGB", "HCT" in the last 72 hours. BMET No results for input(s): "NA", "K", "CL", "CO2", "GLUCOSE", "BUN", "CREATININE", "CALCIUM" in the last 72 hours.   Studies/Results: No results found.  Assessment/Plan: Right renal mass s/p right robotic assisted laparoscopic partial nephrectomy 12/11/2022  -Pain control prn -Diet as tolerated -Reviewed labs, appropriate -OOB, amb, IS -Anticipate discharge home***    LOS: 0 days   Matt R. Madelon Welsch MD 12/11/2022, 5:58 PM [facility]  Pager: [phone]

## 2022-12-12 ENCOUNTER — Other Ambulatory Visit: Payer: Self-pay

## 2022-12-12 ENCOUNTER — Encounter (HOSPITAL_COMMUNITY): Payer: Self-pay | Admitting: Urology

## 2022-12-12 ENCOUNTER — Inpatient Hospital Stay (HOSPITAL_COMMUNITY): Payer: Medicare HMO

## 2022-12-12 LAB — HEMOGLOBIN AND HEMATOCRIT, BLOOD
HCT: 42.6 % (ref 39.0–52.0)
Hemoglobin: 14.1 g/dL (ref 13.0–17.0)

## 2022-12-12 LAB — BASIC METABOLIC PANEL
Anion gap: 13 (ref 5–15)
BUN: 16 mg/dL (ref 8–23)
CO2: 22 mmol/L (ref 22–32)
Calcium: 9 mg/dL (ref 8.9–10.3)
Chloride: 100 mmol/L (ref 98–111)
Creatinine, Ser: 1.25 mg/dL — ABNORMAL HIGH (ref 0.61–1.24)
GFR, Estimated: >60 mL/min (ref >60)
Glucose, Bld: 228 mg/dL — ABNORMAL HIGH (ref 70–99)
Potassium: 4.1 mmol/L (ref 3.5–5.1)
Sodium: 135 mmol/L (ref 135–145)

## 2022-12-12 LAB — BLOOD GAS, ARTERIAL
Acid-Base Excess: 1.5 mmol/L (ref 0.0–2.0)
Bicarbonate: 24.7 mmol/L (ref 20.0–28.0)
Drawn by: 59133
O2 Content: 2 L/min
O2 Saturation: 95 %
Patient temperature: 36.3
pCO2 arterial: 33 mm[Hg] (ref 32–48)
pH, Arterial: 7.48 — ABNORMAL HIGH (ref 7.35–7.45)
pO2, Arterial: 62 mm[Hg] — ABNORMAL LOW (ref 83–108)

## 2022-12-12 LAB — TROPONIN I (HIGH SENSITIVITY)
Troponin I (High Sensitivity): 3 ng/L (ref <18)
Troponin I (High Sensitivity): 3 ng/L (ref <18)

## 2022-12-12 MED ORDER — ALUM & MAG HYDROXIDE-SIMETH 200-200-20 MG/5ML PO SUSP
30.0000 mL | ORAL | Status: DC | PRN
  Administered 2022-12-12: 30 mL via ORAL
  Filled 2022-12-12: qty 30

## 2022-12-12 MED ORDER — NITROGLYCERIN 0.4 MG SL SUBL
0.4000 mg | SUBLINGUAL_TABLET | SUBLINGUAL | Status: DC | PRN
  Administered 2022-12-12: 0.4 mg via SUBLINGUAL
  Filled 2022-12-12: qty 1

## 2022-12-12 MED ORDER — ORAL CARE MOUTH RINSE: 15.0000 mL | OROMUCOSAL | Status: DC | PRN

## 2022-12-12 NOTE — Plan of Care (Signed)
  Problem: Pain Management: Goal: General experience of comfort will improve Outcome: Progressing   Problem: Safety: Goal: Ability to remain free from injury will improve Outcome: Progressing   Problem: Skin Integrity: Goal: Risk for impaired skin integrity will decrease Outcome: Progressing

## 2022-12-12 NOTE — Progress Notes (Signed)
Pt has successfully voided after removal of Foley. No complications. Pt ate regular diet for lunch- tolerated well. States he feels ready to go home. Wife at bedside- she will be his ride home.

## 2022-12-12 NOTE — Progress Notes (Signed)
ABG Collected and send down to lab for analysis. Lab notified.

## 2022-12-12 NOTE — TOC CM/SW Note (Signed)
Transition of Care [facility]) - Inpatient Brief Assessment   Patient Details  Name: Jason Munoz MRN: [id number] Date of Birth: 1955/05/04  [facility]) CM/SW Contact:    Darleene Cleaver, LCSW Phone Number: 12/12/2022, 1:52 PM   Clinical Narrative:  Spoke to patient he did not express any problems with returning back home.  Patient does not have any SDOH needs.   Transition of Care Asessment: Insurance and Status: Insurance coverage has been reviewed Patient has primary care physician: Yes Home environment has been reviewed: Yes lives with wife. Prior level of function:: Indep. Prior/Current Home Services: No current home services Social Determinants of Health Reivew: SDOH reviewed no interventions necessary Readmission risk has been reviewed: Yes Transition of care needs: no transition of care needs at this time

## 2022-12-12 NOTE — Discharge Summary (Signed)
Date of admission: 12/11/2022  Date of discharge: 12/12/2022  Admission diagnosis: Right renal mass  Discharge diagnosis: Right renal mass  Secondary diagnoses: None  History and Physical: For full details, please see admission history and physical. Briefly, Jason Munoz is a 67 y.o. year old patient with a right renal mass s/p right robotic assisted laparoscopic partial nephrectomy.   Hospital Course: The patient recovered in the usual expected fashion.  He had his diet advanced slowly.  Initially managed with IV pain control, then transitioned to PO meds when he was tolerating oral intake.  His labs were stable throughout the hospital course. He did have some right sided chest pain and cardiac workup was negative. This pain resolved after ambulating as was likely gas pain.  He was discharged to home on POD#1.  At the time of discharge the patient was tolerating a regular diet, passing flatus, ambulating, had adequate pain control and was agreeable to discharge.  Follow up as scheduled.    Laboratory values:  Recent Labs    12/11/22 1824 12/12/22 0321  HGB 13.9 14.1  HCT 42.5 42.6   Recent Labs    12/12/22 0321  CREATININE 1.25*    Disposition: Home  Discharge instruction: The patient was instructed to be ambulatory but told to refrain from heavy lifting, strenuous activity, or driving.   Discharge medications:  Allergies as of 12/12/2022   No Known Allergies      Medication List     STOP taking these medications    aspirin 81 MG chewable tablet   celecoxib 200 MG capsule Commonly known as: CELEBREX       TAKE these medications    acetaminophen 500 MG tablet Commonly known as: TYLENOL Take 1-2 tablets (500-1,000 mg total) by mouth every 6 (six) hours as needed for mild pain (pain score 1-3) or moderate pain (pain score 4-6).   atorvastatin 40 MG tablet Commonly known as: LIPITOR TAKE 1 TABLET BY MOUTH EVERY DAY   busPIRone 10 MG tablet Commonly known  as: BUSPAR Take 1 tablet (10 mg total) by mouth at bedtime. What changed: how much to take   docusate sodium 100 MG capsule Commonly known as: COLACE Take 1 capsule (100 mg total) by mouth 2 (two) times daily.   escitalopram 10 MG tablet Commonly known as: LEXAPRO Take 10 mg by mouth daily.   metoprolol succinate 25 MG 24 hr tablet Commonly known as: TOPROL-XL Take 25 mg by mouth every evening.   nitroGLYCERIN 0.4 MG SL tablet Commonly known as: NITROSTAT PLACE 1 TABLET (0.4 MG TOTAL) UNDER THE TONGUE EVERY 5 (FIVE) MINUTES AS NEEDED FOR CHEST PAIN (UP TO 3 DOSES. IF CHEST PAIN PERSISTENT AFTER THE 3RD DOSE, CALL 911).   NON FORMULARY Pt uses a cpap nightly   oxyCODONE 5 MG immediate release tablet Commonly known as: Roxicodone Take 1 tablet (5 mg total) by mouth every 4 (four) hours as needed for severe pain (pain score 7-10) or moderate pain (pain score 4-6).   pantoprazole 40 MG tablet Commonly known as: PROTONIX Take 40 mg by mouth 2 (two) times daily.   Repatha SureClick 140 MG/ML Soaj Generic drug: Evolocumab INJECT 1 PEN INTO THE SKIN EVERY 14 (FOURTEEN) DAYS.   tamsulosin 0.4 MG Caps capsule Commonly known as: FLOMAX Take 0.4 mg by mouth every evening.   Trelegy Ellipta 100-62.5-25 MCG/ACT Aepb Generic drug: Fluticasone-Umeclidin-Vilant Inhale 1 puff into the lungs daily as needed (shortness of breath).  Followup:   Follow-up Information     Rene Paci, MD Follow up on 12/25/2022.   Specialty: Urology Why: at 9:15 Contact information: [address] [place] [address] [place] [address] [phone]                 Dineen Kid. [name] MD [facility]  Pager: [phone]

## 2022-12-12 NOTE — Progress Notes (Addendum)
1 Day Post-Op Subjective: I was called that patient had new onset right sided chest pain.    Evaluated at bedside. Pt states that right sided chest pain has been present since surgery. Denies left sided chest pain. Does have prior history of ACS s/p BMS stent to LAD in 2006 and DES in RCA in 08/2019. States at time of cardiac chest pain, experienced left sided chest pain and chest tightness across entire chest, different from present presentation. Non-pleuritic. Intermittent. Has been better controlled with dilaudid and states oxy not relieving pain.   Sinus rhythm, no tachycardia, BP stable at 150s/100, O2 93% on RA.    CXR with minimal atelectasis at left base. No pneumothorax or pleural effusion. Stable cardiomediastinal silhouette with tortuous aortic arch. EKG read as 1st degree AV heart block and inferior infarct, age undetermined. No ST elevation on my read.  ABG with some hypoxemia, p02 62 Troponin low at 3  Denies nausea or emesis. No flatus or BM.  Objective: Vital signs in last 24 hours: Temp:  [97.4 F (36.3 C)-97.8 F (36.6 C)] 97.7 F (36.5 C) (10/26 0416) Pulse Rate:  [70-92] 87 (10/26 0416) Resp:  [4-20] 18 (10/26 0416) BP: (123-161)/(83-105) 152/95 (10/26 0416) SpO2:  [93 %-98 %] 97 % (10/26 0416) Arterial Line BP: (121-174)/(61-89) 174/89 (10/25 1819) Weight:  [83.9 kg] 83.9 kg (10/25 1035)  Intake/Output from previous day: 10/25 0701 - 10/26 0700 In: 3140 [P.O.:240; I.V.:2300; IV Piggyback:600] Out: 350 [Urine:200; Blood:150] Intake/Output this shift: Total I/O In: 240 [P.O.:240] Out: -   Physical Exam:  General: Alert and oriented CV: RRR Lungs: Clear Abdomen: Soft, ND, ATTP; inc c/d/I Ext: NT, No erythema  Lab Results: Recent Labs    12/11/22 1824 12/12/22 0321  HGB 13.9 14.1  HCT 42.5 42.6   BMET Recent Labs    12/12/22 0321  NA 135  K 4.1  CL 100  CO2 22  GLUCOSE 228*  BUN 16  CREATININE 1.25*  CALCIUM 9.0      Studies/Results: DG CHEST PORT 1 VIEW  Result Date: 12/12/2022 CLINICAL DATA:  Chest pain EXAM: PORTABLE CHEST 1 VIEW COMPARISON:  CT 12/11/2021 FINDINGS: Low lung volumes. Minimal atelectasis left base. No consolidation, pleural effusion or pneumothorax. Stable cardiomediastinal silhouette with tortuous aortic arch. Atherosclerosis. Hardware in the cervical spine IMPRESSION: Low lung volumes with minimal atelectasis at the left base. Electronically Signed   By: Jasmine Pang M.D.   On: 12/12/2022 03:44    Assessment/Plan: Right renal mass s/p right robotic assisted laparoscopic partial nephrectomy 12/11/2022  -Reassured patient that pain likely related to nephrectomy. Non-cardiac appearing with EKG without STEMI and troponin only 3. EKG consistent with prior age indeterminate infarct and he has history of prior stents including stent to RCA -Pain control prn with dilaudid and oxycodone -Aggressive ICS for atelectasis -O2 as needed for slight hypoxemia  -Not suspicious for PE however if worsening pain, hypoxemia or tachycardia, will consider chest CT -Creatinine slight expected bump at 1.25 -Hgb stable at 14.1 -OOB, amb, IS -Anticipate keeping in house today for pain control   LOS: 1 day   Matt R. Breckyn Ticas MD 12/12/2022, 4:19 AM [facility]   Pager: [phone]  Pt pain much improved, after standing and ambulating, started belching. Pain likely gas pain. No nausea or emesis no flatus or BM.  -Pain control prn -Advance to regular diet -Saline lock -Void trial -OOB, amb, IS -Possible home this afternoon vs tomorrow.  Matt R. Daly Whipkey MD [facility]  Pager: [phone]  Reassessed at noon. Chest pain resolved, he thinks was gas related. Denies nausea or emesis. Belching but no flatus. Tolerating some regular diet. Eager to go home.  Passing void trial Discharge home after tolerating regular diet.  Matt R. Jazlyn Tippens MD [facility]  Pager: [phone]

## 2022-12-12 NOTE — Progress Notes (Signed)
Patient complained of right chest pain. Dilaudid prn given but after awhile patient stated he's still in pain, pain level 8/10. Oxycodone prn given and checked his vitals. It showed elevated blood pressure, 160/102. Called Urology, Dr. Carlis Abbott made aware. EKG done. Nitroglycerine and maalox given, placed on oxygen at 2 lpm. Rechecked blood pressure. Will continue to monitor.

## 2022-12-14 ENCOUNTER — Ambulatory Visit: Payer: Medicare HMO

## 2022-12-15 LAB — SURGICAL PATHOLOGY

## 2022-12-25 ENCOUNTER — Telehealth (INDEPENDENT_AMBULATORY_CARE_PROVIDER_SITE_OTHER): Payer: Medicare HMO | Admitting: Pulmonary Disease

## 2022-12-25 ENCOUNTER — Encounter: Payer: Self-pay | Admitting: Pulmonary Disease

## 2022-12-25 DIAGNOSIS — C641 Malignant neoplasm of right kidney, except renal pelvis: Secondary | ICD-10-CM | POA: Diagnosis not present

## 2022-12-25 DIAGNOSIS — J449 Chronic obstructive pulmonary disease, unspecified: Secondary | ICD-10-CM

## 2022-12-25 MED ORDER — TRELEGY ELLIPTA 100-62.5-25 MCG/ACT IN AEPB: 1.0000 | INHALATION_SPRAY | Freq: Every day | RESPIRATORY_TRACT | 6 refills | Status: DC

## 2022-12-25 MED ORDER — ALBUTEROL SULFATE (2.5 MG/3ML) 0.083% IN NEBU
2.5000 mg | INHALATION_SOLUTION | Freq: Four times a day (QID) | RESPIRATORY_TRACT | 12 refills | Status: DC | PRN
Start: 2022-12-25 — End: 2023-06-09

## 2022-12-25 NOTE — Patient Instructions (Addendum)
Re-start trelegy ellipta 1 puff daily - rinse mouth out after each use  We will order you a nebulizer machine and supplies to use albuterol nebulizer treatments.  Albuterol nebulizer solution sent to your pharamacy.   I have ordered a chest [facility] to be done at [facility] on Monday  Recommend ordering an oximeter to monitor oxygen levels, if less than 88% please notify us  Follow up in 2 months with pulmonary function tests.

## 2022-12-25 NOTE — Progress Notes (Signed)
Virtual Visit via Video Note  I connected with Jason Munoz on 12/25/22 at  3:00 PM EST by a video enabled telemedicine application and verified that I am speaking with the correct person using two identifiers.  Location: Patient: home Provider: clinic   I discussed the limitations of evaluation and management by telemedicine and the availability of in person appointments. The patient expressed understanding and agreed to proceed.  History of Present Illness: Jason Munoz is a 67 year old male, former smoker with obstructive sleep apnea on CPAP, GERD, coronary artery disease and emphysema who returns to pulmonary clinic for follow up.   Last seen 12/23/2020.    Had right total nephrectomy a couple weeks ago and has had increased dyspnea since and work of breathing.  He has been wearing CPAP during the day even while awake to help with his work of breathing.  His breathing had gotten worse  prior to the procedure per his wife.    Observations/Objective: No acute distress No coughing Completing full sentences  Assessment and Plan: COPD Progressive dyspnea post-operatively  - resume trelegy ellipta 1 puff daily - Place DME order for nebulizer machine and supplies - Use albuterol nebulizer treatments as needed - Check chest x-ray at [facility] - patient to check O2 levels at home and notify us if less than 88% - PFTs at follow up visit.  Follow Up Instructions: Follow up in 2 months with PFTs   I discussed the assessment and treatment plan with the patient. The patient was provided an opportunity to ask questions and all were answered. The patient agreed with the plan and demonstrated an understanding of the instructions.   The patient was advised to call back or seek an in-person evaluation if the symptoms worsen or if the condition fails to improve as anticipated.  I provided 20 minutes of non-face-to-face time during this encounter.   Martina Sinner, MD

## 2022-12-26 DIAGNOSIS — G4733 Obstructive sleep apnea (adult) (pediatric): Secondary | ICD-10-CM | POA: Diagnosis not present

## 2022-12-28 ENCOUNTER — Ambulatory Visit (HOSPITAL_BASED_OUTPATIENT_CLINIC_OR_DEPARTMENT_OTHER)
Admission: RE | Admit: 2022-12-28 | Discharge: 2022-12-28 | Disposition: A | Discharge status: Home / Self Care | Payer: Medicare HMO | Source: Ambulatory Visit | Attending: Pulmonary Disease | Admitting: Pulmonary Disease

## 2022-12-28 DIAGNOSIS — J449 Chronic obstructive pulmonary disease, unspecified: Secondary | ICD-10-CM | POA: Diagnosis not present

## 2022-12-28 DIAGNOSIS — R0602 Shortness of breath: Secondary | ICD-10-CM | POA: Diagnosis not present

## 2022-12-28 DIAGNOSIS — G4733 Obstructive sleep apnea (adult) (pediatric): Secondary | ICD-10-CM | POA: Diagnosis not present

## 2022-12-28 DIAGNOSIS — Z905 Acquired absence of kidney: Secondary | ICD-10-CM | POA: Diagnosis not present

## 2023-01-20 DIAGNOSIS — D225 Melanocytic nevi of trunk: Secondary | ICD-10-CM | POA: Diagnosis not present

## 2023-01-20 DIAGNOSIS — L814 Other melanin hyperpigmentation: Secondary | ICD-10-CM | POA: Diagnosis not present

## 2023-01-20 DIAGNOSIS — L821 Other seborrheic keratosis: Secondary | ICD-10-CM | POA: Diagnosis not present

## 2023-01-21 DIAGNOSIS — R69 Illness, unspecified: Secondary | ICD-10-CM | POA: Diagnosis not present

## 2023-01-27 DIAGNOSIS — G4733 Obstructive sleep apnea (adult) (pediatric): Secondary | ICD-10-CM | POA: Diagnosis not present

## 2023-03-17 ENCOUNTER — Ambulatory Visit: Payer: Medicare Other | Admitting: Physician Assistant

## 2023-03-17 ENCOUNTER — Ambulatory Visit (HOSPITAL_COMMUNITY)
Admission: RE | Admit: 2023-03-17 | Discharge: 2023-03-17 | Disposition: A | Discharge status: Home / Self Care | Payer: Medicare Other | Source: Ambulatory Visit | Attending: Vascular Surgery | Admitting: Vascular Surgery

## 2023-03-17 VITALS — BP 128/85 | HR 56 | Temp 98.1°F | Resp 18 | Ht 66.0 in | Wt 193.2 lb

## 2023-03-17 DIAGNOSIS — I7143 Infrarenal abdominal aortic aneurysm, without rupture: Secondary | ICD-10-CM

## 2023-03-17 NOTE — Progress Notes (Signed)
[facility] [place] HISTORY AND PHYSICAL   History of Present Illness:  Patient is a 68 y.o. year old male who presents for evaluation of abdominal aortic aneurysm. He had a CTA 6 months ago that revealed 4.8 cm AAA.  This scan also revealed a 2.7 cm right renal mass.  He has had a partial  nephrectomy and right renal cyst decortication.  He has recovered well.  He denies abdominal/lumbar pain out of the ordinary.        Past Medical History:  Diagnosis Date   AAA (abdominal aortic aneurysm) (HCC) 09/2018   3.4cm   Allergy    Anxiety    Arthritis    Colitis    Coronary artery disease 01/2005   stent LAD in 2006   COVID-19 01/2020   Emphysema lung (HCC)    GERD (gastroesophageal reflux disease)    HTN (hypertension)    Hypercholesteremia    Myocardial infarction (HCC)    Sleep apnea    uses cpap   Snoring    Substance abuse (HCC)    Umbilical hernia    Ventral hernia     Past Surgical History:  Procedure Laterality Date   2006     APPENDECTOMY     bilateral ankle fractures     CARPAL TUNNEL RELEASE     COLONOSCOPY     CORONARY ANGIOGRAPHY N/A 09/06/2019   Procedure: CORONARY ANGIOGRAPHY;  Surgeon: Swaziland, Peter M, MD;  Location: [facility] INVASIVE CV LAB;  Service: Cardiovascular;  Laterality: N/A;   CORONARY CTO INTERVENTION N/A 09/06/2019   Procedure: CORONARY CTO INTERVENTION;  Surgeon: Swaziland, Peter M, MD;  Location: [facility] INVASIVE CV LAB;  Service: Cardiovascular;  Laterality: N/A;  RCA   CORONARY ULTRASOUND/IVUS N/A 09/06/2019   Procedure: Intravascular Ultrasound/IVUS;  Surgeon: Swaziland, Peter M, MD;  Location: [facility] INVASIVE CV LAB;  Service: Cardiovascular;  Laterality: N/A;   DG ARTHRO THUMB*R*     ELBOW SURGERY Right    EXTERNAL EAR SURGERY     NECK SURGERY     RIGHT/LEFT HEART CATH AND CORONARY ANGIOGRAPHY N/A 09/01/2019   Procedure: RIGHT/LEFT HEART CATH AND CORONARY ANGIOGRAPHY;  Surgeon: Swaziland, Peter M, MD;  Location: [facility] INVASIVE CV LAB;   Service: Cardiovascular;  Laterality: N/A;   ROBOTIC ASSITED PARTIAL NEPHRECTOMY Right 12/11/2022   Procedure: XI ROBOTIC ASSITED RIGHT PARTIAL NEPHRECTOMY;  Surgeon: Rene Paci, MD;  Location: [facility] ORS;  Service: Urology;  Laterality: Right;  180 MINUTES   UPPER GASTROINTESTINAL ENDOSCOPY       Social History Social History   Tobacco Use   Smoking status: Former    Current packs/day: 0.00    Average packs/day: 1.5 packs/day for 33.0 years (49.5 ttl pk-yrs)    Types: Cigarettes    Start date: 06/04/1986    Quit date: 06/04/2019    Years since quitting: 3.7   Smokeless tobacco: Former  Building services engineer status: Former  Substance Use Topics   Alcohol use: Yes    Alcohol/week: 3.0 standard drinks of alcohol    Types: 3 Standard drinks or equivalent per week   Drug use: No    Family History Family History  Problem Relation Age of Onset   COPD Father    Stomach cancer Neg Hx    Rectal cancer Neg Hx    Esophageal cancer Neg Hx    Colon cancer Neg Hx    Colon polyps Neg Hx     Allergies  No Known Allergies  Current Outpatient Medications  Medication Sig Dispense Refill   acetaminophen (TYLENOL) 500 MG tablet Take 1-2 tablets (500-1,000 mg total) by mouth every 6 (six) hours as needed for mild pain (pain score 1-3) or moderate pain (pain score 4-6). 100 tablet 0   albuterol (PROVENTIL) (2.5 MG/3ML) 0.083% nebulizer solution Take 3 mLs (2.5 mg total) by nebulization every 6 (six) hours as needed for wheezing or shortness of breath. 120 mL 12   atorvastatin (LIPITOR) 40 MG tablet TAKE 1 TABLET BY MOUTH EVERY DAY 90 tablet 3   busPIRone (BUSPAR) 10 MG tablet Take 1 tablet (10 mg total) by mouth at bedtime. (Patient taking differently: Take 5 mg by mouth at bedtime.) 90 tablet 3   docusate sodium (COLACE) 100 MG capsule Take 1 capsule (100 mg total) by mouth 2 (two) times daily.     escitalopram (LEXAPRO) 10 MG tablet Take 10 mg by mouth daily.      Evolocumab  (REPATHA SURECLICK) 140 MG/ML SOAJ INJECT 1 PEN INTO THE SKIN EVERY 14 (FOURTEEN) DAYS. 6 mL 3   Fluticasone-Umeclidin-Vilant (TRELEGY ELLIPTA) 100-62.5-25 MCG/ACT AEPB Inhale 1 puff into the lungs daily. 28 each 6   metoprolol succinate (TOPROL-XL) 25 MG 24 hr tablet Take 25 mg by mouth every evening.     nitroGLYCERIN (NITROSTAT) 0.4 MG SL tablet PLACE 1 TABLET (0.4 MG TOTAL) UNDER THE TONGUE EVERY 5 (FIVE) MINUTES AS NEEDED FOR CHEST PAIN (UP TO 3 DOSES. IF CHEST PAIN PERSISTENT AFTER THE 3RD DOSE, CALL 911). 25 tablet 3   NON FORMULARY Pt uses a cpap nightly     oxyCODONE (ROXICODONE) 5 MG immediate release tablet Take 1 tablet (5 mg total) by mouth every 4 (four) hours as needed for severe pain (pain score 7-10) or moderate pain (pain score 4-6). 20 tablet 0   pantoprazole (PROTONIX) 40 MG tablet Take 40 mg by mouth 2 (two) times daily.     tamsulosin (FLOMAX) 0.4 MG CAPS capsule Take 0.4 mg by mouth every evening.      No current facility-administered medications for this visit.    ROS:   General:  No weight loss, Fever, chills  HEENT: No recent headaches, no nasal bleeding, no visual changes, no sore throat  Neurologic: No dizziness, blackouts, seizures. No recent symptoms of stroke or mini- stroke. No recent episodes of slurred speech, or temporary blindness.  Cardiac: No recent episodes of chest pain/pressure, no shortness of breath at rest.  No shortness of breath with exertion.  Denies history of atrial fibrillation or irregular heartbeat  Vascular: No history of rest pain in feet.  No history of claudication.  No history of non-healing ulcer, No history of DVT   Pulmonary: No home oxygen, no productive cough, no hemoptysis,  No asthma or wheezing  Musculoskeletal:  [ ]  Arthritis, [ ]  Low back pain,  [ ]  Joint pain  Hematologic:No history of hypercoagulable state.  No history of easy bleeding.  No history of anemia  Gastrointestinal: No hematochezia or melena,  No  gastroesophageal reflux, no trouble swallowing  Urinary: [ ]  chronic Kidney disease, [ ]  on HD - [ ]  MWF or [ ]  TTHS, [ ]  Burning with urination, [ ]  Frequent urination, [ ]  Difficulty urinating;   Skin: No rashes  Psychological: No history of anxiety,  No history of depression   Physical Examination  Vitals:   03/17/23 0927  BP: 128/85  Pulse: (!) 56  Resp: 18  Temp: 98.1 F (36.7 C)  TempSrc:  Temporal  SpO2: 98%  Weight: 193 lb 3.2 oz (87.6 kg)  Height: 5\' 6"  (1.676 m)    Body mass index is 31.18 kg/m.  General:  Alert and oriented, no acute distress HEENT: Normal Neck: No bruit or JVD Pulmonary: Clear to auscultation bilaterally Cardiac: Regular Rate and Rhythm without murmur Gastrointestinal: Soft, non-tender, non-distended, no mass, no scars Skin: No rash Extremity Pulses:  2+ radial, femoral, dorsalis pedis, pulses bilaterally Musculoskeletal: No deformity or edema  Neurologic: Upper and lower extremity motor 5/5 and symmetric  DATA:  Abdominal Aorta Findings:  +-----------+-------+----------+----------+--------+--------+--------+  Location  AP (cm)Trans (cm)PSV (cm/s)WaveformThrombusComments  +-----------+-------+----------+----------+--------+--------+--------+  Proximal  2.82   3.96      56                                  +-----------+-------+----------+----------+--------+--------+--------+  Mid       4.31   4.90      68                                  +-----------+-------+----------+----------+--------+--------+--------+  Distal    3.52   3.93                                          +-----------+-------+----------+----------+--------+--------+--------+  RT CIA Prox1.0    1.1       129                                 +-----------+-------+----------+----------+--------+--------+--------+  LT CIA Prox0.9    1.1       100                                  +-----------+-------+----------+----------+--------+--------+--------+    Summary:  Abdominal Aorta: There is evidence of abnormal dilatation of the mid  Abdominal aorta. The largest aortic measurement is 4.9 cm.      ASSESSMENT/PLAN: Asymptomatic AAA seen on CTA 09/30/22 as well as 2.7 cm right renal mass S/P Robot-assisted laparoscopic right partial nephrectomy and right renal cyst decortication   The duplex today show 4.9 cm AAA.  He denies symptoms of abdominal or Lumbar pain.  He will f/u in 6 months for repeat duplex.  If the duplex shows 5.5 cm or larger aneurysmal we will repeat the CTA to confirm.  If he develops symptoms of abdominal/lumbar pain out of the ordinary he will call 911.  He will f/u in 6 months for repeat duplex.      Mosetta Pigeon PA-C [facility] [place] Office: [phone]  MD in clinic [place]

## 2023-03-31 ENCOUNTER — Other Ambulatory Visit (HOSPITAL_COMMUNITY): Payer: Self-pay

## 2023-04-01 ENCOUNTER — Other Ambulatory Visit: Payer: Self-pay

## 2023-04-01 DIAGNOSIS — I7143 Infrarenal abdominal aortic aneurysm, without rupture: Secondary | ICD-10-CM

## 2023-04-08 ENCOUNTER — Telehealth: Payer: Self-pay | Admitting: Pharmacy Technician

## 2023-04-08 ENCOUNTER — Other Ambulatory Visit (HOSPITAL_COMMUNITY): Payer: Self-pay

## 2023-04-08 NOTE — Telephone Encounter (Signed)
Pharmacy Patient Advocate Encounter   Received notification from CoverMyMeds that prior authorization for Repatha is required/requested.   Insurance verification completed.   The patient is insured through [employer] .   Per test claim: PA required; PA submitted to above mentioned insurance via CoverMyMeds Key/confirmation #/EOC [id number] Status is pending

## 2023-04-08 NOTE — Telephone Encounter (Signed)
Pharmacy Patient Advocate Encounter  Received notification from [facility] that Prior Authorization for repatha has been APPROVED from 04/08/23 to 10/06/23. Spoke to pharmacy to process.Copay is $141.00 for 90 days .    PA #/Case ID/Reference #: [id number]

## 2023-04-12 NOTE — Progress Notes (Unsigned)
 PATIENT: Jason Munoz DOB: 1955/04/22  REASON FOR VISIT: follow up HISTORY FROM: patient  No chief complaint on file.    HISTORY OF PRESENT ILLNESS:  04/12/23 ALL:  Jason Munoz returns for follow up for complex sleep apnea treated with CPAP therapy.   04/13/2022 ALL:  Jason Munoz returns for follow up for complex sleep apnea. He repeated HST 12/2021 that confirmed mild sleep apnea. New autoPAP orders were placed. He reports doing well on new machine. He is using therapy nightly. He has had trouble meeting 4 hour compliance over the past few weeks due to allergy/sinus symptoms. He is doing better, now. He denies concerns with machine or supplies.     11/24/2021 ALL: Jason Munoz returns for follow up for complex (mostly obstructive) sleep apnea on CPAP. He was last seen 03/2020 and having difficulty meeting compliance. We started buspirone 10mg  30 minutes before bedtime and adjusted settings following titration study. Since, he reports doing well. He is no longer using buspirone. He is able to use CPAP nightly. He reports that his wife usually asks him to move to the guest room with their dog in the middle of the night due to the dog snoring. He states that he is usually able to use CPAP for 2-3 hours before having to switch rooms. He also uses CPAP with daytime naps. He enjoys about an hour nap every day after work. He denies EDS. He is able to stay awake if needed but enjoys lying down after work. He denies concerns with CPAP or supplies. He does get dry mouth on occasion. He is able to drink a sip of water and returns to sleep. He reports CPAP machine was set up late 2018.     03/26/2020 ALL: He returns for complex sleep apnea on CPAP compliance review. CPAP pressure changed from 11cmH20 to 13cmH20 at last visit with me in 12/2019. Review of download 02/13/2020 showed AHI remained elevated despite change and titration study ordered. He had titration study 03/21/2020 with Jason Munoz. Study is currently  in process of being read. Since last visit, he has continued to struggle with shortness of breath. Cardiology and pulmonology continue close follow up but feel symptoms are related to unmanaged sleep apnea. He also suffered from positive Covid 19 infection at the beginning of 2022. He is feeling better now. He continues to struggle with daily and four hour compliance. He has been better about using CPAP daily but feels he has a hard time keeping it on all night. When he naps during the day (usually once a day) he can use CPAP without any problems. When he uses CPAP at night, he feels that he wrestles with the tubing and becomes irritated. He is using melatonin nightly to see if it will help with anxiety but has not noted any benefit. He sometimes takes 10-30mg  a night.   Compliance report dated 12/20/2019 through 03/18/2020 reveals that he used CPAP 79 of the past 90 days for compliance of 88%.  He used CPAP greater than 4 hours 26 of the past 90 days for compliance of 29%.  Average usage on days used was 3 hours and 4 minutes.  Residual AHI remains elevated at 26.1 events per hour with 22 central events and 3.5 obstructive.  He is on a set pressure of 13 cm of water, increased from 11 cm at last visit in November.  There is no significant leak.  12/21/2019 ALL:  Jason Munoz is a 68 y.o. male here today  for follow up for OSA on CPAP. He admits that he has not been very compliant with CPAP therapy. He has had two cardiac cath procedures with stents placed since last being seen. He reports that he continues to have shob. He is followed by pulmonology.  He admits that he has not used CPAP consistently.  He has had some health concerns that have prevented use.  He continues to snore.  He has a hard time going to sleep.  Compliance report dated 11/20/2019 through 12/19/2019 reveals that he used CPAP 15 of the past 30 days for compliance of 50%.  He used CPAP for greater than 4 hours 6 of the past 30 days for  compliance of 20%.  Average usage on days used was 3 hours and 31 minutes.  Residual AHI was 14.4 on a set pressure of 11 cm of water and EPR of 3.  There was no significant leak noted.  Of note, he had 10.6 central events, 3.1 obstructive events.  HISTORY: (copied from Jason Munoz's note on 02/13/2019)  02-13-2019-  Jason Munoz is a 68 y.o. male , was seen for multiple Rv by Jason Munoz. He had undergone a sleep study on 11-20-2016 and diagnosed with mostly obstructive , complex sleep apnea at [facility] of only 7.9, RDI 9.3/h -  REM AHI 16.5/h. 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  I reviewed the patient's download and also he has used the machine on every also last 30 days he has not always been able to use it for longer than 2 hours, the average user time is 2 hours and 54 minutes on the, he uses CPAP with a set pressure of 11 cmH2O and 3 cm EPR, his residual AHI is 19.3 almost double the apnea count he had a baseline, and these are central dominant apneas.  My suspicion is that the patient has treatment emergent central apnea and needs to be titrated to a BiPAP, air leakage seems not to be the problem. He is using a nasal pillow, which he likes.  In summer he broke 3 ribs , had to sleep in a recliner, for 3 weeks in september he went out of town without CPAP, has abdominal pain, and was less and less compliant.  He went with his wife to [place] before christmas.    2018- seen here as in a referral/ revisit  from Jason Munoz for sleep consultation. Jason Munoz reports tinnitus, fatigue, snoring, erectile dysfunction and a history of high cholesterol. He has also been treated for GERD. Hypertension has been treated with Toprol and Cozaar. He takes a baby aspirin daily. He smokes one pack per day drinks socially alcohol but does not use nonprescription drugs or caffeine. He has been working for many years at [employer] and is a Education officer, museum. His 12 hour shift will begin at 4 AM and  an's at 4:30 PM, other days he will start at 8 AM to 8 PM. He still feels that he gets nighttime sleep and rest.   Sleep habits are as follows:  Usually the patient will come home after work to eat supper with his spouse, he may do some gardening or lawnmowing. And evening as usually spend watching TV. The patient had neck surgery, his wife hip surgery and there not participating in a regular exercise regimen at this time. Bedtime is usually around 10:30 during the week. The patient reports falling asleep rather promptly, he sleeps on his side, on  one pillow. The bedroom is quiet, cool and dark with a ceiling fan. He has up to 3 bathroom breaks each night to go to the bathroom and he wakes up frequently for other reasons 2. He has hip pain, he wakes up and goes back to sleep but he wakes up frequently enough to sleep is fragmented. He rises at the morning at about 3 AM to go to his early shift. He feels that he gets his best sleep just before he has to rise at 3 AM. For this reason he will shift to the 8 to 8 shift only. Currently he rises at 6:50 AM and off this way gets about 5-6 hours of sleep for sure. Most mornings he will be refreshed and restored when waking up. He does not wake with headaches, nausea dizziness chest pain or shortness of breath.   Sleep medical history and family sleep history:  One sister, father wears CPAP. No history of sleep walking or night terrors.    Social history: newlywed, lives with spouse, patient has grown children from a previous marriage.   REVIEW OF SYSTEMS: Out of a complete 14 system review of symptoms, the patient complains only of the following symptoms, shortness of breath, insomnia and all other reviewed systems are negative.  ESS: 10/24, previously 11/24   ALLERGIES: No Known Allergies  HOME MEDICATIONS: Outpatient Medications Prior to Visit  Medication Sig Dispense Refill   acetaminophen (TYLENOL) 500 MG tablet Take 1-2 tablets (500-1,000 mg  total) by mouth every 6 (six) hours as needed for mild pain (pain score 1-3) or moderate pain (pain score 4-6). 100 tablet 0   albuterol (PROVENTIL) (2.5 MG/3ML) 0.083% nebulizer solution Take 3 mLs (2.5 mg total) by nebulization every 6 (six) hours as needed for wheezing or shortness of breath. 120 mL 12   atorvastatin (LIPITOR) 40 MG tablet TAKE 1 TABLET BY MOUTH EVERY DAY 90 tablet 3   busPIRone (BUSPAR) 10 MG tablet Take 1 tablet (10 mg total) by mouth at bedtime. (Patient taking differently: Take 5 mg by mouth at bedtime.) 90 tablet 3   docusate sodium (COLACE) 100 MG capsule Take 1 capsule (100 mg total) by mouth 2 (two) times daily.     escitalopram (LEXAPRO) 10 MG tablet Take 10 mg by mouth daily.      Evolocumab (REPATHA SURECLICK) 140 MG/ML SOAJ INJECT 1 PEN INTO THE SKIN EVERY 14 (FOURTEEN) DAYS. 6 mL 3   Fluticasone-Umeclidin-Vilant (TRELEGY ELLIPTA) 100-62.5-25 MCG/ACT AEPB Inhale 1 puff into the lungs daily. 28 each 6   metoprolol succinate (TOPROL-XL) 25 MG 24 hr tablet Take 25 mg by mouth every evening.     nitroGLYCERIN (NITROSTAT) 0.4 MG SL tablet PLACE 1 TABLET (0.4 MG TOTAL) UNDER THE TONGUE EVERY 5 (FIVE) MINUTES AS NEEDED FOR CHEST PAIN (UP TO 3 DOSES. IF CHEST PAIN PERSISTENT AFTER THE 3RD DOSE, CALL 911). 25 tablet 3   NON FORMULARY Pt uses a cpap nightly     oxyCODONE (ROXICODONE) 5 MG immediate release tablet Take 1 tablet (5 mg total) by mouth every 4 (four) hours as needed for severe pain (pain score 7-10) or moderate pain (pain score 4-6). 20 tablet 0   pantoprazole (PROTONIX) 40 MG tablet Take 40 mg by mouth 2 (two) times daily.     tamsulosin (FLOMAX) 0.4 MG CAPS capsule Take 0.4 mg by mouth every evening.      No facility-administered medications prior to visit.    PAST MEDICAL HISTORY: Past Medical History:  Diagnosis Date   AAA (abdominal aortic aneurysm) (HCC) 09/2018   3.4cm   Allergy    Anxiety    Arthritis    Colitis    Coronary artery disease 01/2005    stent LAD in 2006   COVID-19 01/2020   Emphysema lung (HCC)    GERD (gastroesophageal reflux disease)    HTN (hypertension)    Hypercholesteremia    Myocardial infarction (HCC)    Sleep apnea    uses cpap   Snoring    Substance abuse (HCC)    Umbilical hernia    Ventral hernia     PAST SURGICAL HISTORY: Past Surgical History:  Procedure Laterality Date   2006     APPENDECTOMY     bilateral ankle fractures     CARPAL TUNNEL RELEASE     COLONOSCOPY     CORONARY ANGIOGRAPHY N/A 09/06/2019   Procedure: CORONARY ANGIOGRAPHY;  Surgeon: Swaziland, Peter M, MD;  Location: [facility] INVASIVE CV LAB;  Service: Cardiovascular;  Laterality: N/A;   CORONARY CTO INTERVENTION N/A 09/06/2019   Procedure: CORONARY CTO INTERVENTION;  Surgeon: Swaziland, Peter M, MD;  Location: [facility] INVASIVE CV LAB;  Service: Cardiovascular;  Laterality: N/A;  RCA   CORONARY ULTRASOUND/IVUS N/A 09/06/2019   Procedure: Intravascular Ultrasound/IVUS;  Surgeon: Swaziland, Peter M, MD;  Location: [facility] INVASIVE CV LAB;  Service: Cardiovascular;  Laterality: N/A;   DG ARTHRO THUMB*R*     ELBOW SURGERY Right    EXTERNAL EAR SURGERY     NECK SURGERY     RIGHT/LEFT HEART CATH AND CORONARY ANGIOGRAPHY N/A 09/01/2019   Procedure: RIGHT/LEFT HEART CATH AND CORONARY ANGIOGRAPHY;  Surgeon: Swaziland, Peter M, MD;  Location: [facility] INVASIVE CV LAB;  Service: Cardiovascular;  Laterality: N/A;   ROBOTIC ASSITED PARTIAL NEPHRECTOMY Right 12/11/2022   Procedure: XI ROBOTIC ASSITED RIGHT PARTIAL NEPHRECTOMY;  Surgeon: Rene Paci, MD;  Location: [facility] ORS;  Service: Urology;  Laterality: Right;  180 MINUTES   UPPER GASTROINTESTINAL ENDOSCOPY      FAMILY HISTORY: Family History  Problem Relation Age of Onset   COPD Father    Stomach cancer Neg Hx    Rectal cancer Neg Hx    Esophageal cancer Neg Hx    Colon cancer Neg Hx    Colon polyps Neg Hx     SOCIAL HISTORY: Social History   Socioeconomic History   Marital status: Married     Spouse name: Not on file   Number of children: 3   Years of education: HS   Highest education level: Not on file  Occupational History   Occupation: retired     Associate Professor: [employer]  Tobacco Use   Smoking status: Former    Current packs/day: 0.00    Average packs/day: 1.5 packs/day for 33.0 years (49.5 ttl pk-yrs)    Types: Cigarettes    Start date: 06/04/1986    Quit date: 06/04/2019    Years since quitting: 3.8   Smokeless tobacco: Former  Advertising account planner   Vaping status: Former  Substance and Sexual Activity   Alcohol use: Yes    Alcohol/week: 3.0 standard drinks of alcohol    Types: 3 Standard drinks or equivalent per week   Drug use: No   Sexual activity: Yes    Birth control/protection: None  Other Topics Concern   Not on file  Social History Narrative   Lives at home with his wife.   Right-handed.   No caffeine use.   Social Drivers of Health  Financial Resource Strain: Not on file  Food Insecurity: No Food Insecurity (12/11/2022)   Hunger Vital Sign    Worried About Running Out of Food in the Last Year: Never true    Ran Out of Food in the Last Year: Never true  Transportation Needs: No Transportation Needs (12/11/2022)   PRAPARE - Administrator, Civil Service (Medical): No    Lack of Transportation (Non-Medical): No  Physical Activity: Not on file  Stress: Not on file  Social Connections: Unknown (06/29/2021)   [facility]   Social Network    Social Network: Not on file  Intimate Partner Violence: Not At Risk (12/11/2022)   Humiliation, Afraid, Rape, and Kick questionnaire    Fear of Current or Ex-Partner: No    Emotionally Abused: No    Physically Abused: No    Sexually Abused: No     PHYSICAL EXAM  There were no vitals filed for this visit.    There is no height or weight on file to calculate BMI.  Generalized: Well developed, in no acute distress  Cardiology: normal rate and rhythm, no murmur  noted Respiratory: clear to auscultation bilaterally  Neurological examination  Mentation: Alert oriented to time, place, history taking. Follows all commands speech and language fluent Cranial nerve II-XII: Pupils were equal round reactive to light. Extraocular movements were full, visual field were full  Motor: The motor testing reveals 5 over 5 strength of all 4 extremities. Good symmetric motor tone is noted throughout.  Gait and station: Gait is normal.    DIAGNOSTIC DATA (LABS, IMAGING, TESTING) - I reviewed patient records, labs, notes, testing and imaging myself where available.      No data to display           Lab Results  Component Value Date   WBC 7.8 12/04/2022   HGB 14.1 12/12/2022   HCT 42.6 12/12/2022   MCV 87.8 12/04/2022   PLT 238 12/04/2022      Component Value Date/Time   NA 135 12/12/2022 0321   NA 139 09/14/2022 1222   K 4.1 12/12/2022 0321   CL 100 12/12/2022 0321   CO2 22 12/12/2022 0321   GLUCOSE 228 (H) 12/12/2022 0321   BUN 16 12/12/2022 0321   BUN 9 09/14/2022 1222   CREATININE 1.25 (H) 12/12/2022 0321   CALCIUM 9.0 12/12/2022 0321   PROT 6.7 04/07/2021 1051   ALBUMIN 4.4 04/07/2021 1051   AST 22 04/07/2021 1051   ALT 17 04/07/2021 1051   ALKPHOS 98 04/07/2021 1051   BILITOT 0.8 04/07/2021 1051   GFRNONAA >60 12/12/2022 0321   GFRAA 60 (L) 09/07/2019 0418   Lab Results  Component Value Date   CHOL 79 (L) 04/07/2021   HDL 51 04/07/2021   LDLCALC 8 04/07/2021   TRIG 112 04/07/2021   CHOLHDL 1.5 04/07/2021   No results found for: "HGBA1C" No results found for: "VITAMINB12" No results found for: "TSH"   ASSESSMENT AND PLAN 68 y.o. year old male  has a past medical history of AAA (abdominal aortic aneurysm) (HCC) (09/2018), Allergy, Anxiety, Arthritis, Colitis, Coronary artery disease (01/2005), COVID-19 (01/2020), Emphysema lung (HCC), GERD (gastroesophageal reflux disease), HTN (hypertension), Hypercholesteremia, Myocardial  infarction [facility]), Sleep apnea, Snoring, Substance abuse (HCC), Umbilical hernia, and Ventral hernia. here with   No diagnosis found.   [name] has done well with CPAP therapy at home. Review of compliance report shows excellent daily compliance and suboptimal 4-hour  compliance. He reports having a sinus infection limiting use but is feeling better. I have advised he resume nightly usage for at least 4 hours. He was encouraged to continue close follow-up with cardiology, pulmonology and primary care.  I have encouraged him to resume CPAP therapy daily and focus on meeting 4-hour compliance every day.  Healthy lifestyle habits encouraged.  He verbalizes understanding and agreement with this plan.   No orders of the defined types were placed in this encounter.    No orders of the defined types were placed in this encounter.     Jason Dapper, FNP-C 04/12/2023, 9:03 AM [facility] [address], [address] [place], [place] [address] [phone]

## 2023-04-12 NOTE — Patient Instructions (Signed)

## 2023-04-13 ENCOUNTER — Telehealth: Payer: Self-pay

## 2023-04-13 ENCOUNTER — Encounter: Payer: Self-pay | Admitting: Family Medicine

## 2023-04-13 ENCOUNTER — Ambulatory Visit: Payer: Medicare Other | Admitting: Family Medicine

## 2023-04-13 VITALS — BP 120/70 | HR 94 | Ht 66.0 in | Wt 199.0 lb

## 2023-04-13 DIAGNOSIS — G4739 Other sleep apnea: Secondary | ICD-10-CM | POA: Diagnosis not present

## 2023-04-13 DIAGNOSIS — G4733 Obstructive sleep apnea (adult) (pediatric): Secondary | ICD-10-CM | POA: Diagnosis not present

## 2023-04-13 NOTE — Telephone Encounter (Signed)
 Epic msg sent to [facility] for cpap order placed

## 2023-06-09 ENCOUNTER — Ambulatory Visit: Admitting: Pulmonary Disease

## 2023-06-09 ENCOUNTER — Encounter: Payer: Self-pay | Admitting: Pulmonary Disease

## 2023-06-09 ENCOUNTER — Ambulatory Visit (HOSPITAL_BASED_OUTPATIENT_CLINIC_OR_DEPARTMENT_OTHER): Admitting: Pulmonary Disease

## 2023-06-09 VITALS — BP 115/72 | HR 93 | Ht 65.5 in | Wt 207.0 lb

## 2023-06-09 DIAGNOSIS — K219 Gastro-esophageal reflux disease without esophagitis: Secondary | ICD-10-CM

## 2023-06-09 DIAGNOSIS — J449 Chronic obstructive pulmonary disease, unspecified: Secondary | ICD-10-CM

## 2023-06-09 DIAGNOSIS — I2510 Atherosclerotic heart disease of native coronary artery without angina pectoris: Secondary | ICD-10-CM

## 2023-06-09 DIAGNOSIS — Z87891 Personal history of nicotine dependence: Secondary | ICD-10-CM

## 2023-06-09 DIAGNOSIS — J432 Centrilobular emphysema: Secondary | ICD-10-CM | POA: Diagnosis not present

## 2023-06-09 DIAGNOSIS — G4733 Obstructive sleep apnea (adult) (pediatric): Secondary | ICD-10-CM | POA: Diagnosis not present

## 2023-06-09 LAB — PULMONARY FUNCTION TEST
DL/VA % pred: 86 %
DL/VA: 3.66 ml/min/mmHg/L
DLCO cor % pred: 85 %
DLCO cor: 19.43 ml/min/mmHg
DLCO unc % pred: 85 %
DLCO unc: 19.43 ml/min/mmHg
FEF 25-75 Post: 2.48 L/s
FEF 25-75 Pre: 2.08 L/s
FEF2575-%Change-Post: 19 %
FEF2575-%Pred-Post: 111 %
FEF2575-%Pred-Pre: 93 %
FEV1-%Change-Post: 3 %
FEV1-%Pred-Post: 104 %
FEV1-%Pred-Pre: 101 %
FEV1-Post: 2.91 L
FEV1-Pre: 2.82 L
FEV1FVC-%Change-Post: 1 %
FEV1FVC-%Pred-Pre: 101 %
FEV6-%Change-Post: 3 %
FEV6-%Pred-Post: 107 %
FEV6-%Pred-Pre: 104 %
FEV6-Post: 3.8 L
FEV6-Pre: 3.69 L
FEV6FVC-%Change-Post: 1 %
FEV6FVC-%Pred-Post: 105 %
FEV6FVC-%Pred-Pre: 104 %
FVC-%Change-Post: 1 %
FVC-%Pred-Post: 101 %
FVC-%Pred-Pre: 99 %
FVC-Post: 3.81 L
FVC-Pre: 3.74 L
Post FEV1/FVC ratio: 76 %
Post FEV6/FVC ratio: 100 %
Pre FEV1/FVC ratio: 75 %
Pre FEV6/FVC Ratio: 99 %
RV % pred: 99 %
RV: 2.09 L
TLC % pred: 97 %
TLC: 5.85 L

## 2023-06-09 MED ORDER — ALBUTEROL SULFATE (2.5 MG/3ML) 0.083% IN NEBU: 2.5000 mg | INHALATION_SOLUTION | Freq: Four times a day (QID) | RESPIRATORY_TRACT | 12 refills | Status: AC | PRN

## 2023-06-09 MED ORDER — TRELEGY ELLIPTA 100-62.5-25 MCG/ACT IN AEPB: 1.0000 | INHALATION_SPRAY | Freq: Every day | RESPIRATORY_TRACT | 11 refills | Status: AC

## 2023-06-09 MED ORDER — ALBUTEROL SULFATE HFA 108 (90 BASE) MCG/ACT IN AERS: 2.0000 | INHALATION_SPRAY | Freq: Four times a day (QID) | RESPIRATORY_TRACT | 6 refills | Status: AC | PRN

## 2023-06-09 NOTE — Patient Instructions (Signed)
Full PFT Performed today

## 2023-06-09 NOTE — Progress Notes (Unsigned)
 Synopsis: Referred in 10/2019 for dyspnea  Subjective:   PATIENT ID: Jason Monas GENDER: male DOB: 1955/06/08, MRN: [id number]  HPI  Chief Complaint  Patient presents with   Follow-up   Jason Munoz is a 68 year old male, former smoker with obstructive sleep apnea on CPAP, GERD, coronary artery disease and emphysema who returns to pulmonary clinic for follow up.   OV 12/25/2022 He continues to remain very dyspneic with exertion. He is using trelegy ellipta  1 puff daily. He is not using his albuterol  inhaler as he does not remember to use it. He denies significant cough or sputum production.   He had follow up CPAP titration study via [facility] showed optimal PAP set to 14cmH2O with nasal pillow mask. He has been prescribed an auto-titrating machine at 7-16cmH20 with nasal pillow mask.   OV 06/09/23 His breathing has been stable since last visit. He remains on cpap for OSA. No acute complaints.   Past Medical History:  Diagnosis Date   AAA (abdominal aortic aneurysm) (HCC) 09/2018   3.4cm   Allergy    Anxiety    Arthritis    Colitis    Coronary artery disease 01/2005   stent LAD in 2006   COVID-19 01/2020   Emphysema lung (HCC)    GERD (gastroesophageal reflux disease)    HTN (hypertension)    Hypercholesteremia    Myocardial infarction (HCC)    Sleep apnea    uses cpap   Snoring    Substance abuse (HCC)    Umbilical hernia    Ventral hernia      Family History  Problem Relation Age of Onset   COPD Father    Stomach cancer Neg Hx    Rectal cancer Neg Hx    Esophageal cancer Neg Hx    Colon cancer Neg Hx    Colon polyps Neg Hx      Social History   Socioeconomic History   Marital status: Married    Spouse name: Not on file   Number of children: 3   Years of education: HS   Highest education level: Not on file  Occupational History   Occupation: retired     Associate Professor: [employer]  Tobacco Use   Smoking status: Former     Current packs/day: 0.00    Average packs/day: 1.5 packs/day for 33.0 years (49.5 ttl pk-yrs)    Types: Cigarettes    Start date: 06/04/1986    Quit date: 06/04/2019    Years since quitting: 4.0   Smokeless tobacco: Former  Advertising account planner   Vaping status: Former  Substance and Sexual Activity   Alcohol use: Yes    Alcohol/week: 3.0 standard drinks of alcohol    Types: 3 Standard drinks or equivalent per week   Drug use: No   Sexual activity: Yes    Birth control/protection: None  Other Topics Concern   Not on file  Social History Narrative   Lives at home with his wife.   Right-handed.   No caffeine use.   Social Drivers of Corporate investment banker Strain: Not on file  Food Insecurity: Low Risk  (05/12/2023)   Received from [facility]   Hunger Vital Sign    Worried About Running Out of Food in the Last Year: Never true    Ran Out of Food in the Last Year: Never true  Transportation Needs: No Transportation Needs (05/12/2023)   Received from [facility]  In the past 12 months, has lack of reliable transportation kept you from medical appointments, meetings, work or from getting things needed for daily living? : No  Physical Activity: Not on file  Stress: Not on file  Social Connections: Unknown (06/29/2021)   Received from [facility], [facility]   Social Network    Social Network: Not on file  Intimate Partner Violence: Not At Risk (12/11/2022)   Humiliation, Afraid, Rape, and Kick questionnaire    Fear of Current or Ex-Partner: No    Emotionally Abused: No    Physically Abused: No    Sexually Abused: No     No Known Allergies   Outpatient Medications Prior to Visit  Medication Sig Dispense Refill   acetaminophen  (TYLENOL ) 500 MG tablet Take 1-2 tablets (500-1,000 mg total) by mouth every 6 (six) hours as needed for mild pain (pain score 1-3) or moderate pain (pain score 4-6). 100 tablet 0   atorvastatin  (LIPITOR ) 40 MG tablet TAKE 1 TABLET  BY MOUTH EVERY DAY 90 tablet 3   busPIRone  (BUSPAR ) 10 MG tablet Take 1 tablet (10 mg total) by mouth at bedtime. (Patient taking differently: Take 5 mg by mouth at bedtime.) 90 tablet 3   docusate sodium  (COLACE) 100 MG capsule Take 1 capsule (100 mg total) by mouth 2 (two) times daily.     escitalopram  (LEXAPRO ) 10 MG tablet Take 10 mg by mouth daily.      Evolocumab (REPATHA SURECLICK) 140 MG/ML SOAJ INJECT 1 PEN INTO THE SKIN EVERY 14 (FOURTEEN) DAYS. 6 mL 3   metoprolol  succinate (TOPROL -XL) 25 MG 24 hr tablet Take 25 mg by mouth every evening.     nitroGLYCERIN  (NITROSTAT ) 0.4 MG SL tablet PLACE 1 TABLET (0.4 MG TOTAL) UNDER THE TONGUE EVERY 5 (FIVE) MINUTES AS NEEDED FOR CHEST PAIN (UP TO 3 DOSES. IF CHEST PAIN PERSISTENT AFTER THE 3RD DOSE, CALL 911). 25 tablet 3   NON FORMULARY Pt uses a cpap nightly     oxyCODONE  (ROXICODONE ) 5 MG immediate release tablet Take 1 tablet (5 mg total) by mouth every 4 (four) hours as needed for severe pain (pain score 7-10) or moderate pain (pain score 4-6). 20 tablet 0   pantoprazole  (PROTONIX ) 40 MG tablet Take 40 mg by mouth 2 (two) times daily.     tamsulosin  (FLOMAX ) 0.4 MG CAPS capsule Take 0.4 mg by mouth every evening.      albuterol  (PROVENTIL ) (2.5 MG/3ML) 0.083% nebulizer solution Take 3 mLs (2.5 mg total) by nebulization every 6 (six) hours as needed for wheezing or shortness of breath. 120 mL 12   Fluticasone -Umeclidin-Vilant (TRELEGY ELLIPTA ) 100-62.5-25 MCG/ACT AEPB Inhale 1 puff into the lungs daily. 28 each 6   No facility-administered medications prior to visit.    Review of Systems  Constitutional:  Negative for chills, diaphoresis, fever, malaise/fatigue and weight loss.  HENT:  Negative for congestion, sinus pain and sore throat.   Eyes: Negative.   Respiratory:  Positive for shortness of breath. Negative for cough, hemoptysis, sputum production and wheezing.   Cardiovascular:  Negative for chest pain, palpitations, orthopnea,  claudication, leg swelling and PND.  Gastrointestinal:  Negative for abdominal pain, heartburn, nausea and vomiting.  Genitourinary: Negative.   Musculoskeletal: Negative.   Skin:  Negative for rash.  Neurological: Negative.   Endo/Heme/Allergies: Negative.   Psychiatric/Behavioral: Negative.      Objective:   Vitals:   06/09/23 1321  BP: 115/72  Pulse: 93  SpO2: 99%  Weight: 207  lb (93.9 kg)  Height: 5' 5.5" (1.664 m)      Physical Exam Constitutional:      General: He is not in acute distress.    Appearance: He is obese. He is not ill-appearing.  HENT:     Head: Normocephalic and atraumatic.  Eyes:     General: No scleral icterus.    Conjunctiva/sclera: Conjunctivae normal.     Pupils: Pupils are equal, round, and reactive to light.  Cardiovascular:     Rate and Rhythm: Normal rate and regular rhythm.     Pulses: Normal pulses.     Heart sounds: Normal heart sounds. No murmur heard. Pulmonary:     Effort: Pulmonary effort is normal.     Breath sounds: Normal breath sounds. Decreased air movement present.  Musculoskeletal:     Right lower leg: No edema.     Left lower leg: No edema.  Skin:    General: Skin is warm and dry.  Neurological:     Mental Status: He is alert.     CBC    Component Value Date/Time   WBC 7.8 12/04/2022 1140   RBC 5.16 12/04/2022 1140   HGB 14.1 12/12/2022 0321   HGB 16.8 08/28/2019 1123   HCT 42.6 12/12/2022 0321   HCT 50.9 08/28/2019 1123   PLT 238 12/04/2022 1140   PLT 213 08/28/2019 1123   MCV 87.8 12/04/2022 1140   MCV 89 08/28/2019 1123   MCH 28.9 12/04/2022 1140   MCHC 32.9 12/04/2022 1140   RDW 16.0 (H) 12/04/2022 1140   RDW 15.9 (H) 08/28/2019 1123   LYMPHSABS 1.1 09/05/2018 1016   MONOABS 0.6 09/05/2018 1016   EOSABS 0.1 09/05/2018 1016   BASOSABS 0.1 09/05/2018 1016       Latest Ref Rng & Units 12/12/2022    3:21 AM 12/04/2022   11:40 AM 09/14/2022   12:22 PM  BMP  Glucose 70 - 99 mg/dL 366  95  440    BUN 8 - 23 mg/dL 16  15  9    Creatinine 0.61 - 1.24 mg/dL 3.47  4.25  9.56   BUN/Creat Ratio 10 - 24   10   Sodium 135 - 145 mmol/L 135  139  139   Potassium 3.5 - 5.1 mmol/L 4.1  4.2  3.9   Chloride 98 - 111 mmol/L 100  103  100   CO2 22 - 32 mmol/L 22  27  23    Calcium  8.9 - 10.3 mg/dL 9.0  9.2  9.0    Chest imaging: LCS CT Chest 12/11/21 1. Lung-RADS 2, benign appearance or behavior. Continue annual screening with low-dose chest CT without contrast in 12 months. 2. Three-vessel coronary atherosclerosis. 3. Aortic Atherosclerosis (ICD10-I70.0) and Emphysema (ICD10-J43.9).  Low Dose CT Chest 12/11/20 Lung rads 2. Right subpleural ground glass changes are stable  Low Dose CT Chest 12/06/19 Mediastinum/Nodes: No enlarged mediastinal, hilar, or axillary lymph nodes. Thyroid gland, trachea, and esophagus demonstrate no significant findings.   Lungs/Pleura: No pleural effusion. Paraseptal and centrilobular emphysema. No airspace consolidation, atelectasis or pneumothorax. Nodule within the anterior basal right upper lobe has an equivalent diameter of 4.9 mm. Calcified granuloma is noted within the medial aspect of the left lung apex.  Lung-RADS 2, benign appearance or behavior.  PFT:    Latest Ref Rng & Units 06/09/2023   11:23 AM 12/01/2019   11:51 AM  PFT Results  FVC-Pre L 3.74  P 4.07   FVC-Predicted Pre % 99  P 102   FVC-Post L 3.81  P 4.05   FVC-Predicted Post % 101  P 102   Pre FEV1/FVC % % 75  P 77   Post FEV1/FCV % % 76  P 80   FEV1-Pre L 2.82  P 3.14   FEV1-Predicted Pre % 101  P 105   FEV1-Post L 2.91  P 3.22   DLCO uncorrected ml/min/mmHg 19.43  P 18.65   DLCO UNC% % 85  P 78   DLCO corrected ml/min/mmHg 19.43  P 18.65   DLCO COR %Predicted % 85  P 78   DLVA Predicted % 86  P 67   TLC L 5.85  P 6.65   TLC % Predicted % 97  P 109   RV % Predicted % 99  P 121     P Preliminary result   Echo: 08/24/19 EF 60-65%, normal LV function with grade I diastolic  dysfunction. RV systolic function and size are normal. No valvular dysfunction   Heart Catheterization: 09/06/19 Prox LAD lesion is 35% stenosed. Mid LAD lesion is 60% stenosed. 1st Mrg lesion is 40% stenosed. Prox RCA to Mid RCA lesion is 100% stenosed. Post intervention, there is a 0% residual stenosis. A drug-eluting stent was successfully placed using a SYNERGY XD [id number].   1. Successful CTO PCI of the mid RCA with DES x 1 and IVUS guidance Assessment & Plan:   Centrilobular emphysema (HCC)  Chronic obstructive pulmonary disease, unspecified COPD type (HCC) - Plan: Fluticasone -Umeclidin-Vilant (TRELEGY ELLIPTA ) 100-62.5-25 MCG/ACT AEPB, albuterol  (PROVENTIL ) (2.5 MG/3ML) 0.083% nebulizer solution, albuterol  (VENTOLIN  HFA) 108 (90 Base) MCG/ACT inhaler  Discussion: Jason Munoz is a 68 year old male, former smoker with obstructive sleep apnea on CPAP, GERD, coronary artery disease and emphysema who returns to pulmonary clinic for follow up.   He is to continue trelegy ellipta  1 puff daily for his emphysema.  He is to use albuterol  inhaler as needed. Prescription refills provided today.  He is due for lung cancer screening CT, will contact our screening team.  His sleep apnea is managed via [facility].   Follow up in 1 year  Duaine German, MD [facility] Office: [phone]    Current Outpatient Medications:    acetaminophen  (TYLENOL ) 500 MG tablet, Take 1-2 tablets (500-1,000 mg total) by mouth every 6 (six) hours as needed for mild pain (pain score 1-3) or moderate pain (pain score 4-6)., Disp: 100 tablet, Rfl: 0   albuterol  (VENTOLIN  HFA) 108 (90 Base) MCG/ACT inhaler, Inhale 2 puffs into the lungs every 6 (six) hours as needed for wheezing or shortness of breath., Disp: 8 g, Rfl: 6   atorvastatin  (LIPITOR ) 40 MG tablet, TAKE 1 TABLET BY MOUTH EVERY DAY, Disp: 90 tablet, Rfl: 3   busPIRone  (BUSPAR ) 10 MG tablet, Take 1 tablet  (10 mg total) by mouth at bedtime. (Patient taking differently: Take 5 mg by mouth at bedtime.), Disp: 90 tablet, Rfl: 3   docusate sodium  (COLACE) 100 MG capsule, Take 1 capsule (100 mg total) by mouth 2 (two) times daily., Disp: , Rfl:    escitalopram  (LEXAPRO ) 10 MG tablet, Take 10 mg by mouth daily. , Disp: , Rfl:    Evolocumab (REPATHA SURECLICK) 140 MG/ML SOAJ, INJECT 1 PEN INTO THE SKIN EVERY 14 (FOURTEEN) DAYS., Disp: 6 mL, Rfl: 3   metoprolol  succinate (TOPROL -XL) 25 MG 24 hr tablet, Take 25 mg by mouth every evening., Disp: , Rfl:    nitroGLYCERIN  (NITROSTAT ) 0.4  MG SL tablet, PLACE 1 TABLET (0.4 MG TOTAL) UNDER THE TONGUE EVERY 5 (FIVE) MINUTES AS NEEDED FOR CHEST PAIN (UP TO 3 DOSES. IF CHEST PAIN PERSISTENT AFTER THE 3RD DOSE, CALL 911)., Disp: 25 tablet, Rfl: 3   NON FORMULARY, Pt uses a cpap nightly, Disp: , Rfl:    oxyCODONE  (ROXICODONE ) 5 MG immediate release tablet, Take 1 tablet (5 mg total) by mouth every 4 (four) hours as needed for severe pain (pain score 7-10) or moderate pain (pain score 4-6)., Disp: 20 tablet, Rfl: 0   pantoprazole  (PROTONIX ) 40 MG tablet, Take 40 mg by mouth 2 (two) times daily., Disp: , Rfl:    tamsulosin  (FLOMAX ) 0.4 MG CAPS capsule, Take 0.4 mg by mouth every evening. , Disp: , Rfl:    albuterol  (PROVENTIL ) (2.5 MG/3ML) 0.083% nebulizer solution, Take 3 mLs (2.5 mg total) by nebulization every 6 (six) hours as needed for wheezing or shortness of breath., Disp: 120 mL, Rfl: 12   Fluticasone -Umeclidin-Vilant (TRELEGY ELLIPTA ) 100-62.5-25 MCG/ACT AEPB, Inhale 1 puff into the lungs daily., Disp: 28 each, Rfl: 11

## 2023-06-09 NOTE — Progress Notes (Signed)
Full PFT Performed today

## 2023-06-09 NOTE — Patient Instructions (Signed)
 Continue trelegy ellipta  1 puff daily - rinse mouth out after each use  Use albuterol  inhaler 1-2 puffs every 4-6 hours as needed  I have messaged our lung cancer screening team to schedule you for a CT chest scan  Follow up in 1 year, call sooner if needed

## 2023-06-11 ENCOUNTER — Encounter: Payer: Self-pay | Admitting: Pulmonary Disease

## 2023-06-14 ENCOUNTER — Ambulatory Visit

## 2023-06-14 DIAGNOSIS — Z122 Encounter for screening for malignant neoplasm of respiratory organs: Secondary | ICD-10-CM | POA: Diagnosis not present

## 2023-06-14 DIAGNOSIS — Z87891 Personal history of nicotine dependence: Secondary | ICD-10-CM | POA: Diagnosis not present

## 2023-06-14 DIAGNOSIS — F17210 Nicotine dependence, cigarettes, uncomplicated: Secondary | ICD-10-CM

## 2023-06-17 ENCOUNTER — Telehealth: Payer: Self-pay

## 2023-06-17 DIAGNOSIS — J432 Centrilobular emphysema: Secondary | ICD-10-CM

## 2023-06-17 NOTE — Telephone Encounter (Signed)
 Copied from CRM [id number]. Topic: Clinical - Medication Question >> Jun 17, 2023 10:29 AM Jason Munoz wrote: Reason for CRM: patient would like to know if he needs a nebulizer for the albuterol  solution he was prescribed.  Spoke with pt. Pt advised he does not have neb machine but was prescribed neb solution. Placed order for neb machine. Nothing further needed.

## 2023-06-24 NOTE — Telephone Encounter (Signed)
 Copied from CRM [id number]. Topic: Clinical - Medication Question >> Jun 23, 2023  3:22 PM Jason Munoz wrote: Patient and patient's spouse are calling in to state that he has been prescribed a medication that requires a nebulizer. Patient states he needs a nebulizer to be able to use this medication. Please call patient to update them on if he needs to take this medicine and needs a nebulizer, needs an alternative medication, or needs to discuss with provider. Attaching to original message unaddressed, patient's spouse states he needs direction ASAP.   Spoke with Jason Munoz. Advised pt order was placed on 5/1 for neb machine.  Going to send community message to Sherol Dixie and Dolanda to see what is needed.

## 2023-06-28 ENCOUNTER — Telehealth: Payer: Self-pay

## 2023-06-28 NOTE — Telephone Encounter (Signed)
 This order was sent to [facility]. Please have patient contact [phone] for updates. Thank you   Pt is aware. Nfn

## 2023-07-07 ENCOUNTER — Other Ambulatory Visit: Payer: Self-pay

## 2023-07-07 DIAGNOSIS — Z87891 Personal history of nicotine dependence: Secondary | ICD-10-CM

## 2023-07-07 DIAGNOSIS — Z122 Encounter for screening for malignant neoplasm of respiratory organs: Secondary | ICD-10-CM

## 2023-07-13 ENCOUNTER — Telehealth: Payer: Self-pay | Admitting: Pulmonary Disease

## 2023-07-13 NOTE — Telephone Encounter (Signed)
 Cmn received from [place] for [employer] w/ supplies.

## 2023-07-16 ENCOUNTER — Ambulatory Visit: Payer: Self-pay | Admitting: Internal Medicine

## 2023-07-16 NOTE — Telephone Encounter (Signed)
 Spoke with pt's wife, [name]. Patient is in need of the nebulizer machine. Pt has difficulty breathing. This RN will send a HP message to clinic. This RN advised pt wife to take pt to ED is symptoms continue. Pt wife is agreeable.   Copied from CRM [id number]. Topic: Clinical - Order For Equipment >> Jul 16, 2023  3:50 PM Juliana Ocean wrote: Reason for CRM: pt calling b/c they have not received the order for the nebulizer.  Supposedly it was sent 5/12.  [facility] states they have not received the signed order or the face to face notes for the nebulizer. In order to process they need these 2 things.  Chantel said they reached out to dr 2 X, w/ no response. Wife [name] states pt is struggling to breathe.  If they can get these 2 things, they can process tomorrow. Fax: [phone] Call back:  [phone]

## 2023-07-19 NOTE — Telephone Encounter (Signed)
 CMN received for nebulizer per previous encounter. Needs JD signature. Routing to [place] to advise on form

## 2023-07-20 ENCOUNTER — Other Ambulatory Visit: Payer: Self-pay

## 2023-07-20 DIAGNOSIS — J432 Centrilobular emphysema: Secondary | ICD-10-CM

## 2023-07-20 DIAGNOSIS — J449 Chronic obstructive pulmonary disease, unspecified: Secondary | ICD-10-CM

## 2023-07-20 NOTE — Telephone Encounter (Signed)
 New order has been placed [facility]

## 2023-07-20 NOTE — Telephone Encounter (Signed)
 Copied from CRM [id number]. Topic: Clinical - Request for Lab/Test Order >> Jul 16, 2023  9:20 AM Jason Munoz wrote: Reason for CRM: Patient wife [name] called regarding rx for nebulizer machine, [name] stated [place] stated the prescription needs to be signed by Dr.Dewald, contacted [facility] regarding this. Please f/u with [facility] and patient.  Routing to [place] to follow up on this once CMN for nebulizer machine & supplies gets signed by JD.

## 2023-07-20 NOTE — Telephone Encounter (Signed)
 This was from an appt./order from 12/2022

## 2023-07-20 NOTE — Telephone Encounter (Signed)
 New order sent.

## 2023-07-28 ENCOUNTER — Other Ambulatory Visit: Payer: Self-pay | Admitting: Cardiology

## 2023-07-28 ENCOUNTER — Other Ambulatory Visit: Payer: Self-pay | Admitting: Physician Assistant

## 2023-07-28 DIAGNOSIS — E7800 Pure hypercholesterolemia, unspecified: Secondary | ICD-10-CM

## 2023-07-28 DIAGNOSIS — I2510 Atherosclerotic heart disease of native coronary artery without angina pectoris: Secondary | ICD-10-CM

## 2023-07-28 DIAGNOSIS — E785 Hyperlipidemia, unspecified: Secondary | ICD-10-CM

## 2023-08-03 ENCOUNTER — Other Ambulatory Visit: Payer: Self-pay

## 2023-08-18 ENCOUNTER — Ambulatory Visit: Attending: Cardiology | Admitting: Cardiology

## 2023-08-18 ENCOUNTER — Encounter: Payer: Self-pay | Admitting: Cardiology

## 2023-08-18 VITALS — BP 90/60 | HR 60 | Ht 66.0 in | Wt 213.0 lb

## 2023-08-18 DIAGNOSIS — I2510 Atherosclerotic heart disease of native coronary artery without angina pectoris: Secondary | ICD-10-CM | POA: Diagnosis not present

## 2023-08-18 DIAGNOSIS — I714 Abdominal aortic aneurysm, without rupture, unspecified: Secondary | ICD-10-CM | POA: Diagnosis not present

## 2023-08-18 DIAGNOSIS — I10 Essential (primary) hypertension: Secondary | ICD-10-CM | POA: Diagnosis not present

## 2023-08-18 DIAGNOSIS — E7800 Pure hypercholesterolemia, unspecified: Secondary | ICD-10-CM | POA: Diagnosis not present

## 2023-08-18 MED ORDER — NITROGLYCERIN 0.4 MG SL SUBL: 0.4000 mg | SUBLINGUAL_TABLET | SUBLINGUAL | 3 refills | Status: AC | PRN

## 2023-08-18 NOTE — Progress Notes (Signed)
 Cardiology Office Note:    Date:  08/18/2023   ID:  Jason Munoz, DOB 01-09-1956, MRN [id number]  PCP:  Shepard Ade, MD  [facility] Cardiologist:  Kais Monje [place], MD  [facility] Electrophysiologist:  None   Referring MD: Shepard Ade, MD   Chief Complaint  Patient presents with   Coronary Artery Disease    History of Present Illness:    Jason Munoz is a 68 y.o. male with a hx of CAD, hyperlipidemia, AAA, obstructive sleep apnea, GERD and tobacco abuse.  He underwent BMS to LAD in 2006.   He was seen by Scot Ford PA-C on 08/04/2019. He was complaining of worsening dyspnea on exertion at the time. He quit smoking in April however has since noticing intermittent chest tightness across the entire chest.   Echocardiogram obtained on 08/24/2019 showed EF 60 to 65%, grade 1 DD.  Myoview  obtained on 08/17/2019 showed EF 46%, small defect of mild severity present in the basal inferior and mid inferior location, this is consistent with ischemia, overall it was a low risk study. He continued to have symptoms so a  Cardiac catheterization was performed on 09/01/2019 revealed single-vessel disease with 100% proximal to mid RCA occlusion, patent proximal LAD stent, 60% mid LAD disease, 40% OM1 lesion.  Normal right heart pressure.  It was felt that his symptom may be related to RCA CTO, therefore he was brought back to the hospital on 09/06/2019 for CTO PCI.  He eventually received Synergy 2.5 x 32 mm DES to RCA.    On his last visit he was having a lot of trouble with his breathing and feeling tired during the day. He had a new sleep study and his CPAP was adjusted.  He was seen in April by Scot Ford PA-C. Repeat CT abd/pelvis showed 4.8 cm AAA with thrombus. He also had new right renal mass. Was seen by Dr Sheree with [facility]. Last US  in Jan AAA was 4.9 cm. Has also seen Dr Devere with Urology and had partial right nephrectomy in October for renal cancer. Felt to have removed it all.    He is  doing well from a cardiac standpoint. No chest pain. Still has some SOB - is on Trelegy. No palpitations. Has gained a lot of weight since renal surgery. Up 30 lbs. Doesn't eat sweets. Planning to  exercise more.     Past Medical History:  Diagnosis Date   AAA (abdominal aortic aneurysm) (HCC) 09/2018   3.4cm   Allergy    Anxiety    Arthritis    Colitis    Coronary artery disease 01/2005   stent LAD in 2006   COVID-19 01/2020   Emphysema lung (HCC)    GERD (gastroesophageal reflux disease)    HTN (hypertension)    Hypercholesteremia    Myocardial infarction (HCC)    Sleep apnea    uses cpap   Snoring    Substance abuse (HCC)    Umbilical hernia    Ventral hernia     Past Surgical History:  Procedure Laterality Date   2006     APPENDECTOMY     bilateral ankle fractures     CARPAL TUNNEL RELEASE     COLONOSCOPY     CORONARY ANGIOGRAPHY N/A 09/06/2019   Procedure: CORONARY ANGIOGRAPHY;  Surgeon: [place], Ennis Delpozo M, MD;  Location: [facility] INVASIVE CV LAB;  Service: Cardiovascular;  Laterality: N/A;   CORONARY CTO INTERVENTION N/A 09/06/2019   Procedure: CORONARY CTO INTERVENTION;  Surgeon:  [place], Ericberto Padget M, MD;  Location: [facility] INVASIVE CV LAB;  Service: Cardiovascular;  Laterality: N/A;  RCA   CORONARY ULTRASOUND/IVUS N/A 09/06/2019   Procedure: Intravascular Ultrasound/IVUS;  Surgeon: [place], Berry Gallacher M, MD;  Location: [facility] INVASIVE CV LAB;  Service: Cardiovascular;  Laterality: N/A;   DG ARTHRO THUMB*R*     ELBOW SURGERY Right    EXTERNAL EAR SURGERY     NECK SURGERY     RIGHT/LEFT HEART CATH AND CORONARY ANGIOGRAPHY N/A 09/01/2019   Procedure: RIGHT/LEFT HEART CATH AND CORONARY ANGIOGRAPHY;  Surgeon: [place], Dynasti Kerman M, MD;  Location: [facility] INVASIVE CV LAB;  Service: Cardiovascular;  Laterality: N/A;   ROBOTIC ASSITED PARTIAL NEPHRECTOMY Right 12/11/2022   Procedure: XI ROBOTIC ASSITED RIGHT PARTIAL NEPHRECTOMY;  Surgeon: Devere Lonni Righter, MD;  Location: [facility] ORS;  Service: Urology;   Laterality: Right;  180 MINUTES   UPPER GASTROINTESTINAL ENDOSCOPY      Current Medications: Current Meds  Medication Sig   acetaminophen  (TYLENOL ) 500 MG tablet Take 1-2 tablets (500-1,000 mg total) by mouth every 6 (six) hours as needed for mild pain (pain score 1-3) or moderate pain (pain score 4-6).   albuterol  (PROVENTIL ) (2.5 MG/3ML) 0.083% nebulizer solution Take 3 mLs (2.5 mg total) by nebulization every 6 (six) hours as needed for wheezing or shortness of breath.   albuterol  (VENTOLIN  HFA) 108 (90 Base) MCG/ACT inhaler Inhale 2 puffs into the lungs every 6 (six) hours as needed for wheezing or shortness of breath.   atorvastatin  (LIPITOR ) 40 MG tablet TAKE 1 TABLET BY MOUTH EVERY DAY   busPIRone  (BUSPAR ) 10 MG tablet Take 1 tablet (10 mg total) by mouth at bedtime. (Patient taking differently: Take 5 mg by mouth at bedtime.)   docusate sodium  (COLACE) 100 MG capsule Take 1 capsule (100 mg total) by mouth 2 (two) times daily.   escitalopram  (LEXAPRO ) 10 MG tablet Take 10 mg by mouth daily.    Evolocumab (REPATHA SURECLICK) 140 MG/ML SOAJ INJECT 1 PEN INTO THE SKIN EVERY 14 (FOURTEEN) DAYS.   Fluticasone -Umeclidin-Vilant (TRELEGY ELLIPTA ) 100-62.5-25 MCG/ACT AEPB Inhale 1 puff into the lungs daily.   metoprolol  succinate (TOPROL -XL) 25 MG 24 hr tablet Take 25 mg by mouth every evening.   NON FORMULARY Pt uses a cpap nightly   pantoprazole  (PROTONIX ) 40 MG tablet Take 40 mg by mouth 2 (two) times daily.   tamsulosin  (FLOMAX ) 0.4 MG CAPS capsule Take 0.4 mg by mouth every evening.    [DISCONTINUED] nitroGLYCERIN  (NITROSTAT ) 0.4 MG SL tablet PLACE 1 TABLET (0.4 MG TOTAL) UNDER THE TONGUE EVERY 5 (FIVE) MINUTES AS NEEDED FOR CHEST PAIN (UP TO 3 DOSES. IF CHEST PAIN PERSISTENT AFTER THE 3RD DOSE, CALL 911).     Allergies:   Patient has no known allergies.   Social History   Socioeconomic History   Marital status: Married    Spouse name: Not on file   Number of children: 3   Years of  education: HS   Highest education level: Not on file  Occupational History   Occupation: retired     Associate Professor: [employer]  Tobacco Use   Smoking status: Former    Current packs/day: 0.00    Average packs/day: 1.5 packs/day for 33.0 years (49.5 ttl pk-yrs)    Types: Cigarettes    Start date: 06/04/1986    Quit date: 06/04/2019    Years since quitting: 4.2   Smokeless tobacco: Former  Advertising account planner   Vaping status: Former  Substance and Sexual Activity  Alcohol use: Yes    Alcohol/week: 3.0 standard drinks of alcohol    Types: 3 Standard drinks or equivalent per week   Drug use: No   Sexual activity: Yes    Birth control/protection: None  Other Topics Concern   Not on file  Social History Narrative   Lives at home with his wife.   Right-handed.   No caffeine use.   Social Drivers of Corporate investment banker Strain: Not on file  Food Insecurity: Low Risk  (05/12/2023)   Received from [facility]   Hunger Vital Sign    Within the past 12 months, you worried that your food would run out before you got money to buy more: Never true    Within the past 12 months, the food you bought just didn't last and you didn't have money to get more. : Never true  Transportation Needs: No Transportation Needs (05/12/2023)   Received from [facility]    In the past 12 months, has lack of reliable transportation kept you from medical appointments, meetings, work or from getting things needed for daily living? : No  Physical Activity: Not on file  Stress: Not on file  Social Connections: Unknown (06/29/2021)   Received from [facility]   Social Network    Social Network: Not on file     Family History: The patient's family history includes COPD in his father. There is no history of Stomach cancer, Rectal cancer, Esophageal cancer, Colon cancer, or Colon polyps.  ROS:   Please see the history of present illness.     All other systems reviewed and are  negative.  EKGs/Labs/Other Studies Reviewed:    The following studies were reviewed today:  Cath 09/01/2019 Prox LAD lesion is 35% stenosed. Mid LAD lesion is 60% stenosed. Prox RCA to Mid RCA lesion is 100% stenosed. 1st Mrg lesion is 40% stenosed. LV end diastolic pressure is normal.   1. Single vessel occlusive CAD involving the RCA. The prior stent in the proximal LAD is still patent. There is a borderline stenosis in the mid LAD 2. Normal LV filling pressures. 3. Normal right heart pressures 4. Normal cardiac output.   Plan: based on findings today and recent stress test I feel his symptoms are related to the RCA occlusion. Symptoms have been present for 3-4 months. He is on good medical therapy. Options are to attempt CTO PCI versus increasing medical therapy. Will discuss with patient.    Cath 09/06/2019 Prox LAD lesion is 35% stenosed. Mid LAD lesion is 60% stenosed. 1st Mrg lesion is 40% stenosed. Prox RCA to Mid RCA lesion is 100% stenosed. Post intervention, there is a 0% residual stenosis. A drug-eluting stent was successfully placed using a SYNERGY XD [id number].   1. Successful CTO PCI of the mid RCA with DES x 1 and IVUS guidance   Plan: observe overnight . Anticipate DC in am. DAPT for one year.    CT ANGIOGRAM OF THE ABDOMEN AND PELVIS   TECHNIQUE: CTA of the abdomen and pelvis was performed after the administration of 80 mL of Isovue -370 contrast.  Radiation dose reduction was utilized (automated exposure control, mA or kV adjustment based on patient size, or iterative image reconstruction).  Image post processing was performed creating multi planar 2D and angiographic 3D MIP, shaded surface rendering, or 3D volume rendering images for review and comparison.   COMPARISON:  CT 12/23/2018   INDICATION: abdominal pain, hx of AAA  FINDINGS:   VASCULAR:   - There is an infrarenal abdominal aortic aneurysm which measures 4.2 x 3.6 cm in greatest axial plane,  previously 3.7 x 3 cm. Interval increase in noncalcified atheromatous plaque/mural thrombus along the posterior margin distally. There is also mild scattered calcified plaque. There is no wall thickening or surrounding inflammation.  - Patent celiac, superior mesenteric, renal, and inferior mesenteric arteries.  - Normal caliber bilateral common iliac arteries.   NONVASCULAR:   - Lung Bases: Unremarkable.   - Liver: Normal contour. No definite liver lesions.   - Biliary Tree and Gallbladder: No biliary ductal dilatation.  The gallbladder is unremarkable.   - Pancreas: Normal.   - Spleen: Normal.   - Adrenal Glands: Normal.   - Kidneys and Bladder: No hydronephrosis. There is a simple right renal cyst which is stable; no specific follow-up is recommended. Bladder is unremarkable.   - Gastrointestinal Tract: No bowel obstruction or bowel wall thickening. There is mild colonic diverticulosis.   - Peritoneal Cavity and Retroperitoneum: No free fluid.  No free intraperitoneal air.   - Lymph Nodes: No retroperitoneal, mesenteric or pelvic lymphadenopathy.   - Reproductive: Unremarkable prostate. Vasectomy clips.   - Miscellaneous: Small fat-containing umbilical hernia.   MUSCULOSKELETAL:  - Remote healed left lateral rib fractures. There is severe multilevel degenerative disc disease throughout the visualized thoracic and lumbar spine. Chronic sclerosis at the bilateral sacroiliac joints is unchanged. Procedure Note  Cecillia Ozell BIRCH, MD - 09/22/2021  Formatting of this note might be different from the original.  CT ANGIOGRAM OF THE ABDOMEN AND PELVIS     CLINICAL DATA:  68 year old male with a history of abdominal aortic aneurysm   EXAM: CTA ABDOMEN AND PELVIS WITHOUT AND WITH CONTRAST   TECHNIQUE: Multidetector CT imaging of the abdomen and pelvis was performed using the standard protocol during bolus administration of intravenous contrast. Multiplanar reconstructed  images and MIPs were obtained and reviewed to evaluate the vascular anatomy.   RADIATION DOSE REDUCTION: This exam was performed according to the departmental dose-optimization program which includes automated exposure control, adjustment of the mA and/or kV according to patient size and/or use of iterative reconstruction technique.   CONTRAST:  ISOVUE -370 IOPAMIDOL  (ISOVUE -370) INJECTION 76%   COMPARISON:  08/03/2019, 10/07/2018, 11/10/2015   FINDINGS: VASCULAR   Aorta: Minimal atherosclerotic changes of the lower thoracic aorta.   Diameter at the hiatus estimated 2.4 cm.   Mild atherosclerotic changes of the abdominal aorta. No dissection, pedunculated plaque, ulcerated plaque.   Infrarenal abdominal aortic aneurysm with the greatest diameter on the axial images estimated 4.8 cm. Prior diameter 3.4 cm on the CT 08/03/2019. No periaortic fluid or inflammatory changes.   75% circumferential thrombus/plaque within the aneurysm sac. No stenosis.   Celiac: Celiac artery with mild atherosclerotic changes. Branches are patent   SMA: SMA patent with mild atherosclerotic changes.  Branches patent.   Renals:   - Right: Single right renal artery with mild atherosclerotic changes.   - Left: Single left renal artery without significant atherosclerotic changes.   IMA: IMA patent.   Right lower extremity:   Mild tortuosity of the right iliac system. Mild atherosclerotic changes. No aneurysm, dissection, or occlusion. Hypogastric artery is patent. Common femoral artery patent with minimal atherosclerosis. Proximal SFA and profunda femoris patent.   Left lower extremity:   Mild tortuosity of the left iliac system. Mild atherosclerotic changes. Ectasia of the distal left common iliac artery estimated 16 mm. Hypogastric artery is patent.  Common femoral artery patent. Proximal SFA and profunda femoris patent.   Veins: Unremarkable appearance of the venous system.    Review of the MIP images confirms the above findings.   NON-VASCULAR   Lower chest: No acute.   Hepatobiliary: Unremarkable appearance of the liver. Unremarkable gall bladder.   Pancreas: Unremarkable.   Spleen: Unremarkable.   Adrenals/Urinary Tract:   - Right adrenal gland: Unremarkable   - Left adrenal gland: Unremarkable.   - Right kidney: No hydronephrosis, nephrolithiasis, inflammation, or ureteral dilation. New solid mass of the right renal cortex with hyperenhancement. Greatest diameter estimated 2.7 cm.   Bosniak 1 cyst of the posterior right renal cortex, estimated 4.8 cm.   - Left Kidney: No hydronephrosis, nephrolithiasis, inflammation, or ureteral dilation. No focal lesion.   - Urinary Bladder: Urinary bladder decompressed. Circumferential bladder wall thickening.   Stomach/Bowel:   - Stomach: Unremarkable.   - Small bowel: Unremarkable   - Appendix: Appendix is not visualized, however, no inflammatory changes are present adjacent to the cecum to indicate an appendicitis.   - Colon: Left-sided colonic diverticular disease. No inflammatory changes.   Lymphatic: No adenopathy.   Mesenteric: No free fluid or air. No mesenteric adenopathy.   Reproductive: Transverse diameter of the prostate 3.9 cm.   Other: No hernia.   Musculoskeletal: Degenerative changes of the visualized thoracolumbar spine. No significant bony canal narrowing. No acute displaced fracture. Degenerative changes of the hips.   IMPRESSION: New right renal mass, 2.7 cm compatible with renal cell carcinoma. Referral to Urology recommended for further evaluation.   These above results will be called to the ordering clinician or representative by the Radiologist Assistant, and communication documented in the PACS or [employer].   Infrarenal abdominal aortic aneurysm, estimated 4.8 cm. Recommend follow-up every 6 months and vascular consultation. This recommendation  follows ACR consensus guidelines: White Paper of the ACR Incidental Findings Committee II on Vascular Findings. J Am Coll Radiol 2013; 10:789-794. Aortic aneurysm NOS (ICD10-I71.9).   Aortic atherosclerosis. Associated mild mesenteric and renal artery disease. Mild iliac arterial disease. Aortic Atherosclerosis (ICD10-I70.0).   Additional ancillary findings as above.   Signed,   Ami RAMAN. Alona ROSALEA GRAVER, RPVI   [facility]   [facility]  EKG:  EKG is not ordered today.   Recent Labs: 12/04/2022: Platelets 238 12/12/2022: BUN 16; Creatinine, Ser 1.25; Hemoglobin 14.1; Potassium 4.1; Sodium 135  Recent Lipid Panel    Component Value Date/Time   CHOL 79 (L) 04/07/2021 1052   TRIG 112 04/07/2021 1052   HDL 51 04/07/2021 1052   CHOLHDL 1.5 04/07/2021 1052   LDLCALC 8 04/07/2021 1052     Dated 01/30/19: cholesterol 190, triglycerides 107, HDL 40, LDL 129.  Dated 08/03/19: A1c 5.8%.LFTs and TSH normal Dated 09/07/19: creatinine 1.44. otherwise BMET and CBC normal. Dated 03/11/20: cholesterol 64, triglycerides 90, HDL 36, LDL 10. potassium 5. LFTs normal. Dated 03/12/21: cholesterol 89, triglycerides 157, HDL 48, LDL 15. Creatinine 1.5. A1c 5.2%. otherwise CBC, CMET and TSH normal.  Dated 09/28/22: A1c 5.5%  Physical Exam:    VS:  BP 90/60 (BP Location: Left Arm, Patient Position: Sitting, Cuff Size: Normal)   Pulse 60   Ht 5' 6 (1.676 m)   Wt 213 lb (96.6 kg)   SpO2 95%   BMI 34.38 kg/m     Wt Readings from Last 3 Encounters:  08/18/23 213 lb (96.6 kg)  06/09/23 207 lb (93.9 kg)  06/09/23 205 lb (93  kg)     GEN:  Well nourished, well developed in no acute distress HEENT: Normal NECK: No JVD; No carotid bruits LYMPHATICS: No lymphadenopathy CARDIAC: RRR, no murmurs, rubs, gallops RESPIRATORY:  Clear to auscultation without rales, wheezing or rhonchi  ABDOMEN: Soft, non-tender, non-distended MUSCULOSKELETAL:  No edema;  No deformity  SKIN: Warm and dry NEUROLOGIC:  Alert and oriented x 3 PSYCHIATRIC:  Normal affect   ASSESSMENT:    1. Coronary artery disease involving native coronary artery of native heart without angina pectoris   2. Abdominal aortic aneurysm (AAA) without rupture, unspecified part (HCC)   3. Hypercholesteremia   4. Essential hypertension        PLAN:    In order of problems listed above:  CAD: s/p remote BMS of LAD. S/p CTO PCI of RCA in July 2021. No chest pain. Continue ASA, Statin, and metoprolol .   Hyperlipidemia:  Now on Repatha with great response. LDL down to15.  On atorvastatin .  AAA: recent CT showed increase in size to 4.8 cm. Now followed by Dr Sheree. US  in Jan 4.9 cm  4.   Dyspnea with history of tobacco abuse. Improved with CPAP adjustment.  5.  Right renal mass. S/p partial nephrectomy  Follow up in one year  Medication Adjustments/Labs and Tests Ordered: Current medicines are reviewed at length with the patient today.  Concerns regarding medicines are outlined above.  No orders of the defined types were placed in this encounter.  Meds ordered this encounter  Medications   nitroGLYCERIN  (NITROSTAT ) 0.4 MG SL tablet    Sig: Place 1 tablet (0.4 mg total) under the tongue every 5 (five) minutes as needed for chest pain (Up to 3 doses. If chest pain persistent after the 3rd dose, call 911).    Dispense:  25 tablet    Refill:  3    Patient Instructions  Medication Instructions:  The current medical regimen is effective;  continue present plan and medications.  *If you need a refill on your cardiac medications before your next appointment, please call your pharmacy*  Lab Work: None If you have labs (blood work) drawn today and your tests are completely normal, you will receive your results only by: MyChart Message (if you have MyChart) OR A paper copy in the mail If you have any lab test that is abnormal or we need to change your treatment, we will call  you to review the results.  Testing/Procedures: None  Follow-Up: At [facility], you and your health needs are our priority.  As part of our continuing mission to provide you with exceptional heart care, our providers are all part of one team.  This team includes your primary Cardiologist (physician) and Advanced Practice Providers or APPs (Physician Assistants and Nurse Practitioners) who all work together to provide you with the care you need, when you need it.  Your next appointment:   1 year  Provider:   Kaenan Jake [place], MD    We recommend signing up for the patient portal called MyChart.  Sign up information is provided on this After Visit Summary.  MyChart is used to connect with patients for Virtual Visits (Telemedicine).  Patients are able to view lab/test results, encounter notes, upcoming appointments, etc.  Non-urgent messages can be sent to your provider as well.   To learn more about what you can do with MyChart, go to [email or web address].         Signed, Delvon Chipps [place], MD  08/18/2023 12:06 PM  [facility]

## 2023-08-18 NOTE — Patient Instructions (Signed)
 Medication Instructions:  The current medical regimen is effective;  continue present plan and medications.  *If you need a refill on your cardiac medications before your next appointment, please call your pharmacy*  Lab Work: None If you have labs (blood work) drawn today and your tests are completely normal, you will receive your results only by: MyChart Message (if you have MyChart) OR A paper copy in the mail If you have any lab test that is abnormal or we need to change your treatment, we will call you to review the results.  Testing/Procedures: None  Follow-Up: At [facility], you and your health needs are our priority.  As part of our continuing mission to provide you with exceptional heart care, our providers are all part of one team.  This team includes your primary Cardiologist (physician) and Advanced Practice Providers or APPs (Physician Assistants and Nurse Practitioners) who all work together to provide you with the care you need, when you need it.  Your next appointment:   1 year  Provider:   Peter [place], MD    We recommend signing up for the patient portal called MyChart.  Sign up information is provided on this After Visit Summary.  MyChart is used to connect with patients for Virtual Visits (Telemedicine).  Patients are able to view lab/test results, encounter notes, upcoming appointments, etc.  Non-urgent messages can be sent to your provider as well.   To learn more about what you can do with MyChart, go to [email or web address].

## 2023-08-31 NOTE — Progress Notes (Unsigned)
 Office Note     CC:  follow up Requesting Provider:  Shepard Ade, MD  HPI: Jason Munoz is a 68 y.o. (1955/06/03) male who presents for surveillance follow up of AAA. His AAA was initially seen on CT scan at 4.8 cm in 2024. We have been following it with duplex evaluation every 6 months since his initial evaluation.  He has been without abdominal/lumbar pain.   Today he reports overall doing well. Occasionally gets a pain that radiates across the lower part of his abdomen below his navel. He says this happens mostly when lying down. Only lasts a few seconds. At worst he rates in a 3/10. He says he just figured it was gas pains. 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 He says he has some arthritis in his back. He otherwise denies any severe back or abdominal pain. He denies any pain in his legs on ambulation or rest. No tissue loss. He says he and his wife have been walking more recently so he has had some hip discomfort as a result.   The pt is a statin for cholesterol management.    The pt is not an aspirin .  Other AC:  none The pt is on BB for hypertension.  The pt is not have diabetes. Tobacco hx:  Former, quit in 2021  Past Medical History:  Diagnosis Date   AAA (abdominal aortic aneurysm) (HCC) 09/2018   3.4cm   Allergy    Anxiety    Arthritis    Colitis    Coronary artery disease 01/2005   stent LAD in 2006   COVID-19 01/2020   Emphysema lung (HCC)    GERD (gastroesophageal reflux disease)    HTN (hypertension)    Hypercholesteremia    Myocardial infarction (HCC)    Sleep apnea    uses cpap   Snoring    Substance abuse (HCC)    Umbilical hernia    Ventral hernia     Past Surgical History:  Procedure Laterality Date   2006     APPENDECTOMY     bilateral ankle fractures     CARPAL TUNNEL RELEASE     COLONOSCOPY     CORONARY ANGIOGRAPHY N/A 09/06/2019   Procedure: CORONARY  ANGIOGRAPHY;  Surgeon: Swaziland, Peter M, MD;  Location: [facility] INVASIVE CV LAB;  Service: Cardiovascular;  Laterality: N/A;   CORONARY CTO INTERVENTION N/A 09/06/2019   Procedure: CORONARY CTO INTERVENTION;  Surgeon: Swaziland, Peter M, MD;  Location: [facility] INVASIVE CV LAB;  Service: Cardiovascular;  Laterality: N/A;  RCA   CORONARY ULTRASOUND/IVUS N/A 09/06/2019   Procedure: Intravascular Ultrasound/IVUS;  Surgeon: Swaziland, Peter M, MD;  Location: [facility] INVASIVE CV LAB;  Service: Cardiovascular;  Laterality: N/A;   DG ARTHRO THUMB*R*     ELBOW SURGERY Right    EXTERNAL EAR SURGERY     NECK SURGERY     RIGHT/LEFT HEART CATH AND CORONARY ANGIOGRAPHY N/A 09/01/2019   Procedure: RIGHT/LEFT HEART CATH AND CORONARY ANGIOGRAPHY;  Surgeon: Swaziland, Peter M, MD;  Location: [facility] INVASIVE CV LAB;  Service: Cardiovascular;  Laterality: N/A;   ROBOTIC ASSITED PARTIAL NEPHRECTOMY Right 12/11/2022   Procedure: XI ROBOTIC ASSITED RIGHT PARTIAL NEPHRECTOMY;  Surgeon: Devere Lonni Righter, MD;  Location: [facility] ORS;  Service: Urology;  Laterality: Right;  180 MINUTES   UPPER GASTROINTESTINAL ENDOSCOPY      Social History   Socioeconomic History   Marital status: Married    Spouse  name: Not on file   Number of children: 3   Years of education: HS   Highest education level: Not on file  Occupational History   Occupation: retired     Associate Professor: [employer]  Tobacco Use   Smoking status: Former    Current packs/day: 0.00    Average packs/day: 1.5 packs/day for 33.0 years (49.5 ttl pk-yrs)    Types: Cigarettes    Start date: 06/04/1986    Quit date: 06/04/2019    Years since quitting: 4.2   Smokeless tobacco: Former  Advertising account planner   Vaping status: Former  Substance and Sexual Activity   Alcohol use: Yes    Alcohol/week: 3.0 standard drinks of alcohol    Types: 3 Standard drinks or equivalent per week   Drug use: No   Sexual activity: Yes    Birth control/protection: None  Other Topics Concern   Not on file   Social History Narrative   Lives at home with his wife.   Right-handed.   No caffeine use.   Social Drivers of Corporate investment banker Strain: Not on file  Food Insecurity: Low Risk  (05/12/2023)   Received from [facility]   Hunger Vital Sign    Within the past 12 months, you worried that your food would run out before you got money to buy more: Never true    Within the past 12 months, the food you bought just didn't last and you didn't have money to get more. : Never true  Transportation Needs: No Transportation Needs (05/12/2023)   Received from [facility]    In the past 12 months, has lack of reliable transportation kept you from medical appointments, meetings, work or from getting things needed for daily living? : No  Physical Activity: Not on file  Stress: Not on file  Social Connections: Unknown (06/29/2021)   [facility] from [facility]   Social Network    Social Network: Not on file  Intimate Partner Violence: Not At Risk (12/11/2022)   Humiliation, Afraid, Rape, and Kick questionnaire    Fear of Current or Ex-Partner: No    Emotionally Abused: No    Physically Abused: No    Sexually Abused: No    Family History  Problem Relation Age of Onset   COPD Father    Stomach cancer Neg Hx    Rectal cancer Neg Hx    Esophageal cancer Neg Hx    Colon cancer Neg Hx    Colon polyps Neg Hx     Current Outpatient Medications  Medication Sig Dispense Refill   acetaminophen  (TYLENOL ) 500 MG tablet Take 1-2 tablets (500-1,000 mg total) by mouth every 6 (six) hours as needed for mild pain (pain score 1-3) or moderate pain (pain score 4-6). 100 tablet 0   albuterol  (PROVENTIL ) (2.5 MG/3ML) 0.083% nebulizer solution Take 3 mLs (2.5 mg total) by nebulization every 6 (six) hours as needed for wheezing or shortness of breath. 120 mL 12   albuterol  (VENTOLIN  HFA) 108 (90 Base) MCG/ACT inhaler Inhale 2 puffs into the lungs every 6 (six) hours as needed for  wheezing or shortness of breath. 8 g 6   atorvastatin  (LIPITOR ) 40 MG tablet TAKE 1 TABLET BY MOUTH EVERY DAY 90 tablet 1   busPIRone  (BUSPAR ) 10 MG tablet Take 1 tablet (10 mg total) by mouth at bedtime. (Patient taking differently: Take 5 mg by mouth at bedtime.) 90 tablet 3   docusate sodium  (COLACE) 100 MG  capsule Take 1 capsule (100 mg total) by mouth 2 (two) times daily.     escitalopram  (LEXAPRO ) 10 MG tablet Take 10 mg by mouth daily.      Evolocumab (REPATHA SURECLICK) 140 MG/ML SOAJ INJECT 1 PEN INTO THE SKIN EVERY 14 (FOURTEEN) DAYS. 6 mL 1   Fluticasone -Umeclidin-Vilant (TRELEGY ELLIPTA ) 100-62.5-25 MCG/ACT AEPB Inhale 1 puff into the lungs daily. 28 each 11   metoprolol  succinate (TOPROL -XL) 25 MG 24 hr tablet Take 25 mg by mouth every evening.     nitroGLYCERIN  (NITROSTAT ) 0.4 MG SL tablet Place 1 tablet (0.4 mg total) under the tongue every 5 (five) minutes as needed for chest pain (Up to 3 doses. If chest pain persistent after the 3rd dose, call 911). 25 tablet 3   NON FORMULARY Pt uses a cpap nightly     oxyCODONE  (ROXICODONE ) 5 MG immediate release tablet Take 1 tablet (5 mg total) by mouth every 4 (four) hours as needed for severe pain (pain score 7-10) or moderate pain (pain score 4-6). 20 tablet 0   pantoprazole  (PROTONIX ) 40 MG tablet Take 40 mg by mouth 2 (two) times daily.     tamsulosin  (FLOMAX ) 0.4 MG CAPS capsule Take 0.4 mg by mouth every evening.      No current facility-administered medications for this visit.    No Known Allergies   REVIEW OF SYSTEMS:   [X]  denotes positive finding, [ ]  denotes negative finding Cardiac  Comments:  Chest pain or chest pressure:    Shortness of breath upon exertion:    Short of breath when lying flat:    Irregular heart rhythm:        Vascular    Pain in calf, thigh, or hip brought on by ambulation:    Pain in feet at night that wakes you up from your sleep:     Blood clot in your veins:    Leg swelling:          Pulmonary    Oxygen at home:    Productive cough:     Wheezing:         Neurologic    Sudden weakness in arms or legs:     Sudden numbness in arms or legs:     Sudden onset of difficulty speaking or slurred speech:    Temporary loss of vision in one eye:     Problems with dizziness:         Gastrointestinal    Blood in stool:     Vomited blood:         Genitourinary    Burning when urinating:     Blood in urine:        Psychiatric    Major depression:         Hematologic    Bleeding problems:    Problems with blood clotting too easily:        Skin    Rashes or ulcers:        Constitutional    Fever or chills:      PHYSICAL EXAMINATION:  Vitals:   09/01/23 0910  BP: 122/62  Pulse: 62  Temp: 97.7 F (36.5 C)  TempSrc: Temporal  Weight: 207 lb 14.4 oz (94.3 kg)    General:  WDWN in NAD; vital signs documented above Gait: Normal HENT: WNL, normocephalic Pulmonary: normal non-labored breathing Cardiac: regular HR Abdomen: soft, NT, no masses. No palpable AAA Vascular Exam/Pulses: 2+ radial pulse, 2+ femoral pulses, 2+ DP pulses  Extremities: without ischemic changes, without Gangrene , without cellulitis; without open wounds;  Musculoskeletal: no muscle wasting or atrophy  Neurologic: A&O X 3 Psychiatric:  The pt has Normal affect.   Non-Invasive Vascular Imaging:   VAS US  AAA duplex: Abdominal Aorta Findings:  +-----------+-------+----------+----------+--------+--------+--------+  Location  AP (cm)Trans (cm)PSV (cm/s)WaveformThrombusComments  +-----------+-------+----------+----------+--------+--------+--------+  Proximal  3.66   3.75      66                                  +-----------+-------+----------+----------+--------+--------+--------+  Mid       4.87   5.02                                          +-----------+-------+----------+----------+--------+--------+--------+  Distal    3.80   3.98                                           +-----------+-------+----------+----------+--------+--------+--------+  RT CIA Prox1.2    1.4       62                                  +-----------+-------+----------+----------+--------+--------+--------+   Visualization of the Left CIA Proximal artery was limited.   Summary:  Abdominal Aorta: The largest aortic diameter remains essentially unchanged compared to prior exam. Previous diameter measurement was 4.9 cm obtained on 03/17/2023. Limited visualization of the abdominal vasculature due to overlying bowel gas.    ASSESSMENT/PLAN:: 68 y.o. male here for follow up for AAA. His initial CT scan showed 4.8 cm aneurysm. On duplex his aneurysm has remained stable in the 4.8-4.9 cm range. On duplex today maximum diameter is 5 cm. He has no associated back or abdominal pain.  -Consideration for repair of AAA would be made when the size is 5.5cm, growth > 1 cm/yr, and symptomatic status. - Reviewed that if he develops any shearing abdominal or back pain with radiation down his legs he needs to call 911 or go to nearest ER. He voiced his understanding - He will follow up again in 6 months with repeat AAA duplex   Teretha Damme, PA-C [facility] [phone]  Clinic MD:   Sheree

## 2023-09-01 ENCOUNTER — Ambulatory Visit (HOSPITAL_COMMUNITY)
Admission: RE | Admit: 2023-09-01 | Discharge: 2023-09-01 | Disposition: A | Discharge status: Home / Self Care | Payer: Medicare Other | Source: Ambulatory Visit | Attending: Vascular Surgery | Admitting: Vascular Surgery

## 2023-09-01 ENCOUNTER — Ambulatory Visit: Payer: Medicare Other

## 2023-09-01 VITALS — BP 122/62 | HR 62 | Temp 97.7°F | Wt 207.9 lb

## 2023-09-01 DIAGNOSIS — I7143 Infrarenal abdominal aortic aneurysm, without rupture: Secondary | ICD-10-CM | POA: Diagnosis not present

## 2023-09-06 ENCOUNTER — Other Ambulatory Visit: Payer: Self-pay

## 2023-09-06 DIAGNOSIS — I7143 Infrarenal abdominal aortic aneurysm, without rupture: Secondary | ICD-10-CM

## 2023-12-10 ENCOUNTER — Telehealth: Payer: Self-pay | Admitting: Pharmacy Technician

## 2023-12-10 NOTE — Telephone Encounter (Signed)
   Pharmacy Patient Advocate Encounter   Received notification from Onbase that prior authorization for REPATHA is required/requested.   Insurance verification completed.   The patient is insured through [facility].   Per test claim: PA required; PA submitted to above mentioned insurance via Latent Key/confirmation #/EOC [id number] Status is pending

## 2023-12-10 NOTE — Telephone Encounter (Signed)
 Pharmacy Patient Advocate Encounter  Received notification from [facility] that Prior Authorization for REPATHA has been APPROVED from 12/10/23 to 02/15/25   PA #/Case ID/Reference #: [id number]

## 2024-02-27 ENCOUNTER — Other Ambulatory Visit: Payer: Self-pay | Admitting: Cardiology

## 2024-02-29 ENCOUNTER — Other Ambulatory Visit: Payer: Self-pay | Admitting: Cardiology

## 2024-02-29 DIAGNOSIS — E7800 Pure hypercholesterolemia, unspecified: Secondary | ICD-10-CM

## 2024-02-29 DIAGNOSIS — E785 Hyperlipidemia, unspecified: Secondary | ICD-10-CM

## 2024-02-29 DIAGNOSIS — I2510 Atherosclerotic heart disease of native coronary artery without angina pectoris: Secondary | ICD-10-CM

## 2024-03-06 NOTE — Progress Notes (Unsigned)
 " HISTORY AND PHYSICAL     CC:  follow up. Requesting Provider:  Shepard Ade, MD  HPI: This is a 69 y.o. male who is here today for follow up for AAA.   His AAA was initially seen on CT scan at 4.8 cm in 2024. We have been following it with duplex evaluation every 6 months since his initial evaluation.   Pt was last seen 09/01/2023 and at that time, he did have some abdominal discomfort he attributed to gas pains.  He has hx of umbilical hernia and hx of partial nephrectomy for RCC.  He has some arthritis in his back.  He otherwise did not have any back or abdominal pain.  The pt returns today for follow up.  He states he is doing well overall.  He has issues with his knees and back with arthritis.  He denies any new abdominal or back pain.  He denies any claudication, rest pain or non healing wounds.  He does not smoke.    He states that in his younger years, he enjoyed skiing and has been  The pt is on a statin for cholesterol management.    The pt is not on an aspirin .    Other AC:  none The pt is on BB for hypertension.  The pt is not on medication diabetes. Tobacco hx:  former  Pt does have biological children.    Past Medical History:  Diagnosis Date   AAA (abdominal aortic aneurysm) 09/2018   3.4cm   Allergy    Anxiety    Arthritis    Colitis    Coronary artery disease 01/2005   stent LAD in 2006   COVID-19 01/2020   Emphysema lung (HCC)    GERD (gastroesophageal reflux disease)    HTN (hypertension)    Hypercholesteremia    Myocardial infarction (HCC)    Sleep apnea    uses cpap   Snoring    Substance abuse (HCC)    Umbilical hernia    Ventral hernia     Past Surgical History:  Procedure Laterality Date   2006     APPENDECTOMY     bilateral ankle fractures     CARPAL TUNNEL RELEASE     COLONOSCOPY     CORONARY ANGIOGRAPHY N/A 09/06/2019   Procedure: CORONARY ANGIOGRAPHY;  Surgeon: Jordan, Peter M, MD;  Location: [facility] INVASIVE CV LAB;  Service:  Cardiovascular;  Laterality: N/A;   CORONARY CTO INTERVENTION N/A 09/06/2019   Procedure: CORONARY CTO INTERVENTION;  Surgeon: Jordan, Peter M, MD;  Location: [facility] INVASIVE CV LAB;  Service: Cardiovascular;  Laterality: N/A;  RCA   CORONARY ULTRASOUND/IVUS N/A 09/06/2019   Procedure: Intravascular Ultrasound/IVUS;  Surgeon: Jordan, Peter M, MD;  Location: [facility] INVASIVE CV LAB;  Service: Cardiovascular;  Laterality: N/A;   DG ARTHRO THUMB*R*     ELBOW SURGERY Right    EXTERNAL EAR SURGERY     NECK SURGERY     RIGHT/LEFT HEART CATH AND CORONARY ANGIOGRAPHY N/A 09/01/2019   Procedure: RIGHT/LEFT HEART CATH AND CORONARY ANGIOGRAPHY;  Surgeon: Jordan, Peter M, MD;  Location: [facility] INVASIVE CV LAB;  Service: Cardiovascular;  Laterality: N/A;   ROBOTIC ASSITED PARTIAL NEPHRECTOMY Right 12/11/2022   Procedure: XI ROBOTIC ASSITED RIGHT PARTIAL NEPHRECTOMY;  Surgeon: Devere Lonni Righter, MD;  Location: [facility] ORS;  Service: Urology;  Laterality: Right;  180 MINUTES   UPPER GASTROINTESTINAL ENDOSCOPY      Allergies[1]  Current Outpatient Medications  Medication Sig Dispense Refill  albuterol  (PROVENTIL ) (2.5 MG/3ML) 0.083% nebulizer solution Take 3 mLs (2.5 mg total) by nebulization every 6 (six) hours as needed for wheezing or shortness of breath. 120 mL 12   albuterol  (VENTOLIN  HFA) 108 (90 Base) MCG/ACT inhaler Inhale 2 puffs into the lungs every 6 (six) hours as needed for wheezing or shortness of breath. 8 g 6   atorvastatin  (LIPITOR ) 40 MG tablet TAKE 1 TABLET BY MOUTH EVERY DAY 90 tablet 1   busPIRone  (BUSPAR ) 10 MG tablet Take 1 tablet (10 mg total) by mouth at bedtime. (Patient taking differently: Take 5 mg by mouth at bedtime.) 90 tablet 3   docusate sodium  (COLACE) 100 MG capsule Take 1 capsule (100 mg total) by mouth 2 (two) times daily.     escitalopram  (LEXAPRO ) 10 MG tablet Take 10 mg by mouth daily.      Evolocumab (REPATHA SURECLICK) 140 MG/ML SOAJ INJECT 1 PEN INTO THE SKIN EVERY 14  (FOURTEEN) DAYS. 6 mL 1   Fluticasone -Umeclidin-Vilant (TRELEGY ELLIPTA ) 100-62.5-25 MCG/ACT AEPB Inhale 1 puff into the lungs daily. 28 each 11   metoprolol  succinate (TOPROL -XL) 25 MG 24 hr tablet Take 25 mg by mouth every evening.     nitroGLYCERIN  (NITROSTAT ) 0.4 MG SL tablet Place 1 tablet (0.4 mg total) under the tongue every 5 (five) minutes as needed for chest pain (Up to 3 doses. If chest pain persistent after the 3rd dose, call 911). 25 tablet 3   NON FORMULARY Pt uses a cpap nightly     oxyCODONE  (ROXICODONE ) 5 MG immediate release tablet Take 1 tablet (5 mg total) by mouth every 4 (four) hours as needed for severe pain (pain score 7-10) or moderate pain (pain score 4-6). 20 tablet 0   pantoprazole  (PROTONIX ) 40 MG tablet Take 40 mg by mouth 2 (two) times daily.     tamsulosin  (FLOMAX ) 0.4 MG CAPS capsule Take 0.4 mg by mouth every evening.      No current facility-administered medications for this visit.    Family History  Problem Relation Age of Onset   COPD Father    Stomach cancer Neg Hx    Rectal cancer Neg Hx    Esophageal cancer Neg Hx    Colon cancer Neg Hx    Colon polyps Neg Hx     Social History   Socioeconomic History   Marital status: Married    Spouse name: Not on file   Number of children: 3   Years of education: HS   Highest education level: Not on file  Occupational History   Occupation: retired     Associate Professor: [employer]  Tobacco Use   Smoking status: Former    Current packs/day: 0.00    Average packs/day: 1.5 packs/day for 33.0 years (49.5 ttl pk-yrs)    Types: Cigarettes    Start date: 06/04/1986    Quit date: 06/04/2019    Years since quitting: 4.7   Smokeless tobacco: Former  Building Services Engineer status: Former  Substance and Sexual Activity   Alcohol use: Yes    Alcohol/week: 3.0 standard drinks of alcohol    Types: 3 Standard drinks or equivalent per week   Drug use: No   Sexual activity: Yes    Birth control/protection: None   Other Topics Concern   Not on file  Social History Narrative   Lives at home with his wife.   Right-handed.   No caffeine use.   Social Drivers of Health   Tobacco Use: Medium  Risk (09/01/2023)   Patient History    Smoking Tobacco Use: Former    Smokeless Tobacco Use: Former    Passive Exposure: Not on Actuary Strain: Not on file  Food Insecurity: Low Risk (05/12/2023)   Received from [facility]   Epic    Within the past 12 months, you worried that your food would run out before you got money to buy more: Never true    Within the past 12 months, the food you bought just didn't last and you didn't have money to get more. : Never true  Transportation Needs: No Transportation Needs (05/12/2023)   Received from [facility]    In the past 12 months, has lack of reliable transportation kept you from medical appointments, meetings, work or from getting things needed for daily living? : No  Physical Activity: Not on file  Stress: Not on file  Social Connections: Unknown (06/29/2021)   Received from [facility]   Social Network    Social Network: Not on file  Intimate Partner Violence: Not At Risk (12/11/2022)   Humiliation, Afraid, Rape, and Kick questionnaire    Fear of Current or Ex-Partner: No    Emotionally Abused: No    Physically Abused: No    Sexually Abused: No  Depression (PHQ2-9): Low Risk (04/23/2021)   Depression (PHQ2-9)    PHQ-2 Score: 3  Alcohol Screen: Not on file  Housing: Low Risk (05/12/2023)   Received from [facility]   Epic    What is your living situation today?: I have a steady place to live    Think about the place you live. Do you have problems with any of the following? Choose all that apply:: None/None on this list  Utilities: Low Risk (05/12/2023)   Received from [facility]   Utilities    In the past 12 months has the electric, gas, oil, or water company threatened to shut off services in your home? : No   Health Literacy: Not on file     REVIEW OF SYSTEMS:   [X]  denotes positive finding, [ ]  denotes negative finding Cardiac  Comments:  Chest pain or chest pressure:    Shortness of breath upon exertion:    Short of breath when lying flat:    Irregular heart rhythm:        Vascular    Pain in calf, thigh, or hip brought on by ambulation:    Pain in feet at night that wakes you up from your sleep:     Blood clot in your veins:    Leg swelling:         Pulmonary    Oxygen at home:    Productive cough:     Wheezing:         Neurologic    Sudden weakness in arms or legs:     Sudden numbness in arms or legs:     Sudden onset of difficulty speaking or slurred speech:    Temporary loss of vision in one eye:     Problems with dizziness:         Gastrointestinal    Blood in stool:     Vomited blood:         Genitourinary    Burning when urinating:     Blood in urine:        Psychiatric    Major depression:         Hematologic    Bleeding  problems:    Problems with blood clotting too easily:        Skin    Rashes or ulcers:        Constitutional    Fever or chills:      PHYSICAL EXAMINATION:  Today's Vitals   03/08/24 0921  BP: (!) 145/89  Pulse: 68  Temp: 97.9 F (36.6 C)  SpO2: 95%  Weight: 213 lb (96.6 kg)  Height: 5' 6 (1.676 m)   Body mass index is 34.38 kg/m.   General:  WDWN in NAD; vital signs documented above Gait: Not observed HENT: WNL, normocephalic Pulmonary: normal non-labored breathing  Cardiac: regular HR, without carotid bruits Abdomen: soft, NT; aortic pulse is not palpable Skin: without rashes Vascular Exam/Pulses:  Right Left  Radial 2+ (normal) 2+ (normal)  Popliteal Unable to palpate Unable to palpate  DP 2+ (normal) 2+ (normal)  PT 2+ (normal) 2+ (normal)   Extremities: without ischemic changes, without Gangrene , without cellulitis; without open wounds Musculoskeletal: no muscle wasting or atrophy  Neurologic: A&O X 3;   No focal weakness or paresthesias are detected Psychiatric:  The pt has Normal affect.   Non-Invasive Vascular Imaging:   AAA Arterial duplex on 03/08/2024: Abdominal Aorta Findings:  +-----------+-------+----------+----------+--------+--------+--------+  Location  AP (cm)Trans (cm)PSV (cm/s)WaveformThrombusComments  +-----------+-------+----------+----------+--------+--------+--------+  Proximal  3.84   3.47                                          +-----------+-------+----------+----------+--------+--------+--------+  Mid       4.80   4.92                                          +-----------+-------+----------+----------+--------+--------+--------+  Distal    3.77   3.94                                          +-----------+-------+----------+----------+--------+--------+--------+  RT CIA Prox1.4    1.2                                           +-----------+-------+----------+----------+--------+--------+--------+  LT CIA Prox1.2    1.3       62                                  +-----------+-------+----------+----------+--------+--------+--------+   Previous AAA arterial duplex on 09/01/2023: Abdominal Aorta Findings:  +-----------+-------+----------+----------+--------+--------+--------+  Location  AP (cm)Trans (cm)PSV (cm/s)WaveformThrombusComments  +-----------+-------+----------+----------+--------+--------+--------+  Proximal  3.66   3.75      66                                  +-----------+-------+----------+----------+--------+--------+--------+  Mid       4.87   5.02                                          +-----------+-------+----------+----------+--------+--------+--------+  Distal    3.80   3.98                                          +-----------+-------+----------+----------+--------+--------+--------+  RT CIA Prox1.2    1.4       62                                   +-----------+-------+----------+----------+--------+--------+--------+   Visualization of the Left CIA Proximal artery was limited.   Summary:  Abdominal Aorta: The largest aortic diameter remains essentially unchanged compared to prior exam. Previous diameter measurement was 4.9 cm obtained on 03/17/2023. Limited visualization of the abdominal vasculature due to overlying bowel gas.   ASSESSMENT/PLAN:: 69 y.o. male here for follow up for AAA.  His AAA was initially seen on CT scan at 4.8 cm in 2024. We have been following it with duplex evaluation every 6 months since his initial evaluation.  -pt doing well without new abdominal or back pain.  He has palpable pedal pulses. -his duplex today is essentially unchanged at 5cm.   -continue statin -discussed with him that the risk of rupture is no zero and also discussed with pt if they develop sudden severe abdominal or back pain, they should call 911.  He knows that if this does reach 5.5cm, we would get CTA and evaluate his options for repair. -discussed that his children should be evaluated for AAA given it could have a familial component.  -pt will f/u in 6 months with AAA duplex.  Also at that time, will obtain popliteal duplex to evaluate for popliteal aneurysms.    Lucie Apt, [facility] [phone]  Clinic MD:   Pearline on call MD     [1] No Known Allergies  "

## 2024-03-08 ENCOUNTER — Ambulatory Visit (HOSPITAL_COMMUNITY)
Admission: RE | Admit: 2024-03-08 | Discharge: 2024-03-08 | Disposition: A | Discharge status: Home / Self Care | Source: Ambulatory Visit | Attending: Vascular Surgery | Admitting: Vascular Surgery

## 2024-03-08 ENCOUNTER — Ambulatory Visit: Admitting: Physician Assistant

## 2024-03-08 VITALS — BP 145/89 | HR 68 | Temp 97.9°F | Ht 66.0 in | Wt 213.0 lb

## 2024-03-08 DIAGNOSIS — I7143 Infrarenal abdominal aortic aneurysm, without rupture: Secondary | ICD-10-CM | POA: Insufficient documentation

## 2024-03-09 ENCOUNTER — Other Ambulatory Visit: Payer: Self-pay | Admitting: *Deleted

## 2024-03-09 DIAGNOSIS — I739 Peripheral vascular disease, unspecified: Secondary | ICD-10-CM

## 2024-03-09 DIAGNOSIS — I7143 Infrarenal abdominal aortic aneurysm, without rupture: Secondary | ICD-10-CM

## 2024-04-10 ENCOUNTER — Ambulatory Visit: Admitting: Neurology

## 2024-04-13 ENCOUNTER — Ambulatory Visit: Payer: Medicare Other | Admitting: Neurology

## 2024-09-06 ENCOUNTER — Ambulatory Visit (HOSPITAL_COMMUNITY)

## 2024-09-06 ENCOUNTER — Ambulatory Visit
